# Patient Record
Sex: Female | Born: 1940 | Race: White | Hispanic: No | Marital: Married | Smoking: Former smoker
Health system: Southern US, Community
[De-identification: ages and names within clinical notes are randomized; demographics above are authoritative.]

## PROBLEM LIST (undated history)

## (undated) DIAGNOSIS — E785 Hyperlipidemia, unspecified: Secondary | ICD-10-CM

## (undated) DIAGNOSIS — Z8589 Personal history of malignant neoplasm of other organs and systems: Secondary | ICD-10-CM

## (undated) DIAGNOSIS — M179 Osteoarthritis of knee, unspecified: Secondary | ICD-10-CM

## (undated) DIAGNOSIS — Z8719 Personal history of other diseases of the digestive system: Secondary | ICD-10-CM

## (undated) DIAGNOSIS — J302 Other seasonal allergic rhinitis: Secondary | ICD-10-CM

## (undated) DIAGNOSIS — Z8619 Personal history of other infectious and parasitic diseases: Secondary | ICD-10-CM

## (undated) DIAGNOSIS — I5189 Other ill-defined heart diseases: Secondary | ICD-10-CM

## (undated) DIAGNOSIS — Z9289 Personal history of other medical treatment: Secondary | ICD-10-CM

## (undated) DIAGNOSIS — D509 Iron deficiency anemia, unspecified: Secondary | ICD-10-CM

## (undated) DIAGNOSIS — K219 Gastro-esophageal reflux disease without esophagitis: Secondary | ICD-10-CM

## (undated) DIAGNOSIS — N393 Stress incontinence (female) (male): Secondary | ICD-10-CM

## (undated) DIAGNOSIS — M1710 Unilateral primary osteoarthritis, unspecified knee: Secondary | ICD-10-CM

## (undated) DIAGNOSIS — Z8679 Personal history of other diseases of the circulatory system: Secondary | ICD-10-CM

## (undated) DIAGNOSIS — L719 Rosacea, unspecified: Secondary | ICD-10-CM

## (undated) DIAGNOSIS — I38 Endocarditis, valve unspecified: Secondary | ICD-10-CM

## (undated) DIAGNOSIS — Z85828 Personal history of other malignant neoplasm of skin: Secondary | ICD-10-CM

## (undated) DIAGNOSIS — G43909 Migraine, unspecified, not intractable, without status migrainosus: Secondary | ICD-10-CM

## (undated) DIAGNOSIS — R51 Headache: Secondary | ICD-10-CM

## (undated) DIAGNOSIS — C801 Malignant (primary) neoplasm, unspecified: Secondary | ICD-10-CM

## (undated) DIAGNOSIS — R002 Palpitations: Secondary | ICD-10-CM

## (undated) DIAGNOSIS — K5790 Diverticulosis of intestine, part unspecified, without perforation or abscess without bleeding: Secondary | ICD-10-CM

## (undated) DIAGNOSIS — N819 Female genital prolapse, unspecified: Secondary | ICD-10-CM

## (undated) DIAGNOSIS — R0989 Other specified symptoms and signs involving the circulatory and respiratory systems: Secondary | ICD-10-CM

## (undated) DIAGNOSIS — I480 Paroxysmal atrial fibrillation: Secondary | ICD-10-CM

## (undated) HISTORY — DX: Headache: R51

## (undated) HISTORY — DX: Hyperlipidemia, unspecified: E78.5

## (undated) HISTORY — DX: Female genital prolapse, unspecified: N81.9

## (undated) HISTORY — DX: Stress incontinence (female) (male): N39.3

## (undated) HISTORY — PX: MOHS SURGERY: SUR867

## (undated) HISTORY — DX: Gastro-esophageal reflux disease without esophagitis: K21.9

## (undated) HISTORY — DX: Personal history of other diseases of the circulatory system: Z86.79

## (undated) HISTORY — DX: Other specified symptoms and signs involving the circulatory and respiratory systems: R09.89

## (undated) HISTORY — DX: Unilateral primary osteoarthritis, unspecified knee: M17.10

## (undated) HISTORY — DX: Other seasonal allergic rhinitis: J30.2

## (undated) HISTORY — DX: Osteoarthritis of knee, unspecified: M17.9

## (undated) HISTORY — DX: Diverticulosis of intestine, part unspecified, without perforation or abscess without bleeding: K57.90

## (undated) HISTORY — DX: Endocarditis, valve unspecified: I38

## (undated) HISTORY — DX: Personal history of other medical treatment: Z92.89

## (undated) HISTORY — DX: Migraine, unspecified, not intractable, without status migrainosus: G43.909

## (undated) HISTORY — DX: Personal history of other infectious and parasitic diseases: Z86.19

## (undated) HISTORY — PX: CATARACT EXTRACTION: SUR2

## (undated) HISTORY — DX: Paroxysmal atrial fibrillation: I48.0

## (undated) HISTORY — DX: Rosacea, unspecified: L71.9

## (undated) HISTORY — DX: Palpitations: R00.2

## (undated) HISTORY — DX: Other ill-defined heart diseases: I51.89

## (undated) HISTORY — DX: Personal history of other diseases of the digestive system: Z87.19

---
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
## 1969-12-15 HISTORY — PX: LIPOMA EXCISION: SHX5283

## 1979-12-16 HISTORY — PX: ABDOMINAL HYSTERECTOMY: SHX81

## 1998-06-11 ENCOUNTER — Ambulatory Visit (HOSPITAL_COMMUNITY): Admission: RE | Admit: 1998-06-11 | Discharge: 1998-06-11 | Payer: Self-pay | Admitting: Obstetrics & Gynecology

## 1999-12-16 HISTORY — PX: SALPINGOOPHORECTOMY: SHX82

## 1999-12-16 HISTORY — PX: INCONTINENCE SURGERY: SHX676

## 2007-12-16 DIAGNOSIS — Z9289 Personal history of other medical treatment: Secondary | ICD-10-CM

## 2007-12-16 DIAGNOSIS — D509 Iron deficiency anemia, unspecified: Secondary | ICD-10-CM

## 2007-12-16 HISTORY — PX: COLONOSCOPY: SHX174

## 2007-12-16 HISTORY — PX: ESOPHAGOGASTRODUODENOSCOPY: SHX1529

## 2007-12-16 HISTORY — DX: Iron deficiency anemia, unspecified: D50.9

## 2007-12-16 HISTORY — DX: Personal history of other medical treatment: Z92.89

## 2007-12-16 HISTORY — PX: OTHER SURGICAL HISTORY: SHX169

## 2008-05-17 LAB — HM COLONOSCOPY

## 2008-12-15 DIAGNOSIS — Z85828 Personal history of other malignant neoplasm of skin: Secondary | ICD-10-CM

## 2008-12-15 HISTORY — DX: Personal history of other malignant neoplasm of skin: Z85.828

## 2010-01-10 ENCOUNTER — Encounter: Payer: Self-pay | Admitting: Cardiology

## 2010-09-04 ENCOUNTER — Ambulatory Visit: Payer: Self-pay | Admitting: Anesthesiology

## 2010-09-19 ENCOUNTER — Ambulatory Visit: Payer: Self-pay | Admitting: Anesthesiology

## 2011-12-30 DIAGNOSIS — N816 Rectocele: Secondary | ICD-10-CM | POA: Diagnosis not present

## 2011-12-30 DIAGNOSIS — R3915 Urgency of urination: Secondary | ICD-10-CM | POA: Diagnosis not present

## 2012-02-04 DIAGNOSIS — K219 Gastro-esophageal reflux disease without esophagitis: Secondary | ICD-10-CM | POA: Diagnosis not present

## 2012-02-04 DIAGNOSIS — R002 Palpitations: Secondary | ICD-10-CM | POA: Diagnosis not present

## 2012-02-04 DIAGNOSIS — L719 Rosacea, unspecified: Secondary | ICD-10-CM | POA: Diagnosis not present

## 2012-02-04 DIAGNOSIS — G43009 Migraine without aura, not intractable, without status migrainosus: Secondary | ICD-10-CM | POA: Diagnosis not present

## 2012-02-16 DIAGNOSIS — Z1231 Encounter for screening mammogram for malignant neoplasm of breast: Secondary | ICD-10-CM | POA: Diagnosis not present

## 2012-03-03 DIAGNOSIS — Z85828 Personal history of other malignant neoplasm of skin: Secondary | ICD-10-CM | POA: Diagnosis not present

## 2012-03-03 DIAGNOSIS — D485 Neoplasm of uncertain behavior of skin: Secondary | ICD-10-CM | POA: Diagnosis not present

## 2012-03-03 DIAGNOSIS — L851 Acquired keratosis [keratoderma] palmaris et plantaris: Secondary | ICD-10-CM | POA: Diagnosis not present

## 2012-03-03 DIAGNOSIS — L820 Inflamed seborrheic keratosis: Secondary | ICD-10-CM | POA: Diagnosis not present

## 2012-06-21 DIAGNOSIS — H26499 Other secondary cataract, unspecified eye: Secondary | ICD-10-CM | POA: Diagnosis not present

## 2012-07-12 DIAGNOSIS — Z961 Presence of intraocular lens: Secondary | ICD-10-CM | POA: Diagnosis not present

## 2012-08-31 DIAGNOSIS — N905 Atrophy of vulva: Secondary | ICD-10-CM | POA: Diagnosis not present

## 2012-08-31 DIAGNOSIS — N3941 Urge incontinence: Secondary | ICD-10-CM | POA: Diagnosis not present

## 2012-08-31 DIAGNOSIS — Z124 Encounter for screening for malignant neoplasm of cervix: Secondary | ICD-10-CM | POA: Diagnosis not present

## 2012-08-31 DIAGNOSIS — N393 Stress incontinence (female) (male): Secondary | ICD-10-CM | POA: Diagnosis not present

## 2012-10-05 DIAGNOSIS — E782 Mixed hyperlipidemia: Secondary | ICD-10-CM | POA: Diagnosis not present

## 2012-10-05 DIAGNOSIS — G43009 Migraine without aura, not intractable, without status migrainosus: Secondary | ICD-10-CM | POA: Diagnosis not present

## 2012-10-05 DIAGNOSIS — R002 Palpitations: Secondary | ICD-10-CM | POA: Diagnosis not present

## 2012-10-05 DIAGNOSIS — K219 Gastro-esophageal reflux disease without esophagitis: Secondary | ICD-10-CM | POA: Diagnosis not present

## 2012-10-26 DIAGNOSIS — H26499 Other secondary cataract, unspecified eye: Secondary | ICD-10-CM | POA: Diagnosis not present

## 2012-10-29 DIAGNOSIS — G43109 Migraine with aura, not intractable, without status migrainosus: Secondary | ICD-10-CM | POA: Diagnosis not present

## 2012-11-04 DIAGNOSIS — L820 Inflamed seborrheic keratosis: Secondary | ICD-10-CM | POA: Diagnosis not present

## 2012-11-04 DIAGNOSIS — Z85828 Personal history of other malignant neoplasm of skin: Secondary | ICD-10-CM | POA: Diagnosis not present

## 2012-11-04 DIAGNOSIS — L608 Other nail disorders: Secondary | ICD-10-CM | POA: Diagnosis not present

## 2012-11-04 DIAGNOSIS — B351 Tinea unguium: Secondary | ICD-10-CM | POA: Diagnosis not present

## 2012-11-04 DIAGNOSIS — L719 Rosacea, unspecified: Secondary | ICD-10-CM | POA: Diagnosis not present

## 2012-11-04 DIAGNOSIS — L570 Actinic keratosis: Secondary | ICD-10-CM | POA: Diagnosis not present

## 2013-04-05 DIAGNOSIS — E782 Mixed hyperlipidemia: Secondary | ICD-10-CM | POA: Diagnosis not present

## 2013-04-05 DIAGNOSIS — K219 Gastro-esophageal reflux disease without esophagitis: Secondary | ICD-10-CM | POA: Diagnosis not present

## 2013-04-05 DIAGNOSIS — R1310 Dysphagia, unspecified: Secondary | ICD-10-CM | POA: Diagnosis not present

## 2013-05-02 DIAGNOSIS — Z1231 Encounter for screening mammogram for malignant neoplasm of breast: Secondary | ICD-10-CM | POA: Diagnosis not present

## 2013-05-03 DIAGNOSIS — R12 Heartburn: Secondary | ICD-10-CM | POA: Diagnosis not present

## 2013-05-04 DIAGNOSIS — L820 Inflamed seborrheic keratosis: Secondary | ICD-10-CM | POA: Diagnosis not present

## 2013-05-04 DIAGNOSIS — L570 Actinic keratosis: Secondary | ICD-10-CM | POA: Diagnosis not present

## 2013-05-04 DIAGNOSIS — L719 Rosacea, unspecified: Secondary | ICD-10-CM | POA: Diagnosis not present

## 2013-05-04 DIAGNOSIS — D485 Neoplasm of uncertain behavior of skin: Secondary | ICD-10-CM | POA: Diagnosis not present

## 2013-05-04 DIAGNOSIS — Z85828 Personal history of other malignant neoplasm of skin: Secondary | ICD-10-CM | POA: Diagnosis not present

## 2013-05-04 DIAGNOSIS — R21 Rash and other nonspecific skin eruption: Secondary | ICD-10-CM | POA: Diagnosis not present

## 2013-05-19 ENCOUNTER — Ambulatory Visit: Payer: Self-pay | Admitting: Gastroenterology

## 2013-05-19 DIAGNOSIS — K219 Gastro-esophageal reflux disease without esophagitis: Secondary | ICD-10-CM | POA: Diagnosis not present

## 2013-05-19 DIAGNOSIS — E785 Hyperlipidemia, unspecified: Secondary | ICD-10-CM | POA: Diagnosis not present

## 2013-05-19 DIAGNOSIS — Z86010 Personal history of colonic polyps: Secondary | ICD-10-CM | POA: Diagnosis not present

## 2013-05-19 DIAGNOSIS — R12 Heartburn: Secondary | ICD-10-CM | POA: Diagnosis not present

## 2013-05-19 DIAGNOSIS — K2940 Chronic atrophic gastritis without bleeding: Secondary | ICD-10-CM | POA: Diagnosis not present

## 2013-05-19 DIAGNOSIS — R1319 Other dysphagia: Secondary | ICD-10-CM | POA: Diagnosis not present

## 2013-05-19 DIAGNOSIS — Z8049 Family history of malignant neoplasm of other genital organs: Secondary | ICD-10-CM | POA: Diagnosis not present

## 2013-05-19 DIAGNOSIS — R1310 Dysphagia, unspecified: Secondary | ICD-10-CM | POA: Diagnosis not present

## 2013-05-19 DIAGNOSIS — K2970 Gastritis, unspecified, without bleeding: Secondary | ICD-10-CM | POA: Diagnosis not present

## 2013-05-19 DIAGNOSIS — M1710 Unilateral primary osteoarthritis, unspecified knee: Secondary | ICD-10-CM | POA: Diagnosis not present

## 2013-05-19 DIAGNOSIS — Z8249 Family history of ischemic heart disease and other diseases of the circulatory system: Secondary | ICD-10-CM | POA: Diagnosis not present

## 2013-05-19 DIAGNOSIS — Z85828 Personal history of other malignant neoplasm of skin: Secondary | ICD-10-CM | POA: Diagnosis not present

## 2013-05-19 DIAGNOSIS — K319 Disease of stomach and duodenum, unspecified: Secondary | ICD-10-CM | POA: Diagnosis not present

## 2013-05-19 DIAGNOSIS — K5730 Diverticulosis of large intestine without perforation or abscess without bleeding: Secondary | ICD-10-CM | POA: Diagnosis not present

## 2013-05-19 DIAGNOSIS — Z818 Family history of other mental and behavioral disorders: Secondary | ICD-10-CM | POA: Diagnosis not present

## 2013-05-19 DIAGNOSIS — K2900 Acute gastritis without bleeding: Secondary | ICD-10-CM | POA: Diagnosis not present

## 2013-06-02 DIAGNOSIS — R12 Heartburn: Secondary | ICD-10-CM | POA: Diagnosis not present

## 2013-06-03 DIAGNOSIS — G43119 Migraine with aura, intractable, without status migrainosus: Secondary | ICD-10-CM | POA: Diagnosis not present

## 2013-06-28 DIAGNOSIS — H04129 Dry eye syndrome of unspecified lacrimal gland: Secondary | ICD-10-CM | POA: Diagnosis not present

## 2013-09-02 DIAGNOSIS — N816 Rectocele: Secondary | ICD-10-CM | POA: Diagnosis not present

## 2013-09-02 DIAGNOSIS — N905 Atrophy of vulva: Secondary | ICD-10-CM | POA: Diagnosis not present

## 2013-09-02 DIAGNOSIS — Z01419 Encounter for gynecological examination (general) (routine) without abnormal findings: Secondary | ICD-10-CM | POA: Diagnosis not present

## 2013-10-19 DIAGNOSIS — E782 Mixed hyperlipidemia: Secondary | ICD-10-CM | POA: Diagnosis not present

## 2013-10-26 DIAGNOSIS — B351 Tinea unguium: Secondary | ICD-10-CM | POA: Diagnosis not present

## 2013-10-26 DIAGNOSIS — L608 Other nail disorders: Secondary | ICD-10-CM | POA: Diagnosis not present

## 2013-10-26 DIAGNOSIS — L820 Inflamed seborrheic keratosis: Secondary | ICD-10-CM | POA: Diagnosis not present

## 2013-10-26 DIAGNOSIS — Z85828 Personal history of other malignant neoplasm of skin: Secondary | ICD-10-CM | POA: Diagnosis not present

## 2013-10-26 DIAGNOSIS — L570 Actinic keratosis: Secondary | ICD-10-CM | POA: Diagnosis not present

## 2013-10-26 DIAGNOSIS — R21 Rash and other nonspecific skin eruption: Secondary | ICD-10-CM | POA: Diagnosis not present

## 2014-01-26 DIAGNOSIS — D485 Neoplasm of uncertain behavior of skin: Secondary | ICD-10-CM | POA: Diagnosis not present

## 2014-01-26 DIAGNOSIS — L570 Actinic keratosis: Secondary | ICD-10-CM | POA: Diagnosis not present

## 2014-01-26 DIAGNOSIS — L821 Other seborrheic keratosis: Secondary | ICD-10-CM | POA: Diagnosis not present

## 2014-01-26 DIAGNOSIS — L988 Other specified disorders of the skin and subcutaneous tissue: Secondary | ICD-10-CM | POA: Diagnosis not present

## 2014-01-26 DIAGNOSIS — R21 Rash and other nonspecific skin eruption: Secondary | ICD-10-CM | POA: Diagnosis not present

## 2014-02-27 DIAGNOSIS — E782 Mixed hyperlipidemia: Secondary | ICD-10-CM | POA: Diagnosis not present

## 2014-03-07 DIAGNOSIS — H9209 Otalgia, unspecified ear: Secondary | ICD-10-CM | POA: Diagnosis not present

## 2014-03-07 DIAGNOSIS — J310 Chronic rhinitis: Secondary | ICD-10-CM | POA: Diagnosis not present

## 2014-05-16 DIAGNOSIS — R259 Unspecified abnormal involuntary movements: Secondary | ICD-10-CM | POA: Diagnosis not present

## 2014-05-16 DIAGNOSIS — E782 Mixed hyperlipidemia: Secondary | ICD-10-CM | POA: Diagnosis not present

## 2014-05-31 DIAGNOSIS — G43109 Migraine with aura, not intractable, without status migrainosus: Secondary | ICD-10-CM | POA: Diagnosis not present

## 2014-07-31 DIAGNOSIS — H5200 Hypermetropia, unspecified eye: Secondary | ICD-10-CM | POA: Diagnosis not present

## 2014-07-31 DIAGNOSIS — H04129 Dry eye syndrome of unspecified lacrimal gland: Secondary | ICD-10-CM | POA: Diagnosis not present

## 2014-10-02 DIAGNOSIS — Z23 Encounter for immunization: Secondary | ICD-10-CM | POA: Diagnosis not present

## 2014-10-02 DIAGNOSIS — Z1389 Encounter for screening for other disorder: Secondary | ICD-10-CM | POA: Diagnosis not present

## 2014-10-02 DIAGNOSIS — R296 Repeated falls: Secondary | ICD-10-CM | POA: Diagnosis not present

## 2014-10-02 DIAGNOSIS — Z0000 Encounter for general adult medical examination without abnormal findings: Secondary | ICD-10-CM | POA: Diagnosis not present

## 2014-10-16 DIAGNOSIS — N816 Rectocele: Secondary | ICD-10-CM | POA: Diagnosis not present

## 2014-10-16 DIAGNOSIS — Z01419 Encounter for gynecological examination (general) (routine) without abnormal findings: Secondary | ICD-10-CM | POA: Diagnosis not present

## 2014-10-16 DIAGNOSIS — N393 Stress incontinence (female) (male): Secondary | ICD-10-CM | POA: Diagnosis not present

## 2014-10-16 DIAGNOSIS — R300 Dysuria: Secondary | ICD-10-CM | POA: Diagnosis not present

## 2014-10-17 DIAGNOSIS — Z1231 Encounter for screening mammogram for malignant neoplasm of breast: Secondary | ICD-10-CM | POA: Diagnosis not present

## 2014-10-17 DIAGNOSIS — N63 Unspecified lump in breast: Secondary | ICD-10-CM | POA: Diagnosis not present

## 2014-11-15 DIAGNOSIS — R5383 Other fatigue: Secondary | ICD-10-CM | POA: Diagnosis not present

## 2014-11-15 DIAGNOSIS — E784 Other hyperlipidemia: Secondary | ICD-10-CM | POA: Diagnosis not present

## 2015-02-28 DIAGNOSIS — R7989 Other specified abnormal findings of blood chemistry: Secondary | ICD-10-CM | POA: Diagnosis not present

## 2015-03-19 DIAGNOSIS — Z9189 Other specified personal risk factors, not elsewhere classified: Secondary | ICD-10-CM | POA: Insufficient documentation

## 2015-03-19 DIAGNOSIS — Z8719 Personal history of other diseases of the digestive system: Secondary | ICD-10-CM | POA: Insufficient documentation

## 2015-03-19 DIAGNOSIS — M179 Osteoarthritis of knee, unspecified: Secondary | ICD-10-CM | POA: Insufficient documentation

## 2015-03-19 DIAGNOSIS — Z86010 Personal history of colonic polyps: Secondary | ICD-10-CM | POA: Insufficient documentation

## 2015-03-19 DIAGNOSIS — Z23 Encounter for immunization: Secondary | ICD-10-CM | POA: Insufficient documentation

## 2015-03-19 DIAGNOSIS — J309 Allergic rhinitis, unspecified: Secondary | ICD-10-CM | POA: Insufficient documentation

## 2015-03-19 DIAGNOSIS — H9209 Otalgia, unspecified ear: Secondary | ICD-10-CM | POA: Insufficient documentation

## 2015-03-19 DIAGNOSIS — I340 Nonrheumatic mitral (valve) insufficiency: Secondary | ICD-10-CM | POA: Insufficient documentation

## 2015-03-19 DIAGNOSIS — Z0000 Encounter for general adult medical examination without abnormal findings: Secondary | ICD-10-CM | POA: Insufficient documentation

## 2015-03-19 DIAGNOSIS — Z1239 Encounter for other screening for malignant neoplasm of breast: Secondary | ICD-10-CM | POA: Insufficient documentation

## 2015-03-19 DIAGNOSIS — L719 Rosacea, unspecified: Secondary | ICD-10-CM | POA: Insufficient documentation

## 2015-03-19 DIAGNOSIS — N816 Rectocele: Secondary | ICD-10-CM | POA: Insufficient documentation

## 2015-03-19 DIAGNOSIS — M1710 Unilateral primary osteoarthritis, unspecified knee: Secondary | ICD-10-CM | POA: Insufficient documentation

## 2015-03-19 DIAGNOSIS — R002 Palpitations: Secondary | ICD-10-CM | POA: Insufficient documentation

## 2015-03-19 DIAGNOSIS — E785 Hyperlipidemia, unspecified: Secondary | ICD-10-CM | POA: Insufficient documentation

## 2015-03-19 DIAGNOSIS — Z1331 Encounter for screening for depression: Secondary | ICD-10-CM | POA: Insufficient documentation

## 2015-03-19 DIAGNOSIS — R0989 Other specified symptoms and signs involving the circulatory and respiratory systems: Secondary | ICD-10-CM | POA: Insufficient documentation

## 2015-03-19 DIAGNOSIS — N393 Stress incontinence (female) (male): Secondary | ICD-10-CM | POA: Insufficient documentation

## 2015-03-19 DIAGNOSIS — IMO0001 Reserved for inherently not codable concepts without codable children: Secondary | ICD-10-CM | POA: Insufficient documentation

## 2015-03-19 DIAGNOSIS — Z8679 Personal history of other diseases of the circulatory system: Secondary | ICD-10-CM | POA: Insufficient documentation

## 2015-03-19 DIAGNOSIS — R5383 Other fatigue: Secondary | ICD-10-CM | POA: Insufficient documentation

## 2015-03-19 DIAGNOSIS — R7989 Other specified abnormal findings of blood chemistry: Secondary | ICD-10-CM | POA: Insufficient documentation

## 2015-03-19 DIAGNOSIS — R253 Fasciculation: Secondary | ICD-10-CM | POA: Insufficient documentation

## 2015-03-19 DIAGNOSIS — Z9181 History of falling: Secondary | ICD-10-CM | POA: Insufficient documentation

## 2015-03-19 DIAGNOSIS — J310 Chronic rhinitis: Secondary | ICD-10-CM | POA: Insufficient documentation

## 2015-03-19 DIAGNOSIS — K219 Gastro-esophageal reflux disease without esophagitis: Secondary | ICD-10-CM | POA: Insufficient documentation

## 2015-05-18 ENCOUNTER — Ambulatory Visit (INDEPENDENT_AMBULATORY_CARE_PROVIDER_SITE_OTHER): Payer: Medicare Other | Admitting: Family Medicine

## 2015-05-18 ENCOUNTER — Encounter (INDEPENDENT_AMBULATORY_CARE_PROVIDER_SITE_OTHER): Payer: Self-pay

## 2015-05-18 ENCOUNTER — Encounter: Payer: Self-pay | Admitting: Family Medicine

## 2015-05-18 VITALS — BP 100/78 | HR 74 | Resp 15 | Ht 63.75 in | Wt 160.0 lb

## 2015-05-18 DIAGNOSIS — G43909 Migraine, unspecified, not intractable, without status migrainosus: Secondary | ICD-10-CM | POA: Insufficient documentation

## 2015-05-18 DIAGNOSIS — IMO0002 Reserved for concepts with insufficient information to code with codable children: Secondary | ICD-10-CM

## 2015-05-18 DIAGNOSIS — G43709 Chronic migraine without aura, not intractable, without status migrainosus: Secondary | ICD-10-CM | POA: Diagnosis not present

## 2015-05-18 DIAGNOSIS — E785 Hyperlipidemia, unspecified: Secondary | ICD-10-CM | POA: Diagnosis not present

## 2015-05-18 MED ORDER — ATORVASTATIN CALCIUM 80 MG PO TABS: 80.0000 mg | ORAL_TABLET | Freq: Every day | ORAL | Status: DC

## 2015-05-18 NOTE — Progress Notes (Signed)
Name: Alexandria Moreno   MRN: [id number]    DOB: 10/13/1941   Date:05/18/2015       Progress Note  Subjective  Chief Complaint  Chief Complaint  Patient presents with  . Hyperlipidemia  . Migraine    Hyperlipidemia This is a chronic problem. The problem is controlled. Recent lipid tests were reviewed and are normal. She has no history of chronic renal disease, diabetes or hypothyroidism. Associated symptoms include myalgias. Pertinent negatives include no chest pain, focal weakness or leg pain. (Blateral lower leg pain especially upon waking up, usually relieved by stretching and flexing the legs.) Current antihyperlipidemic treatment includes statins. Risk factors for coronary artery disease include dyslipidemia, family history and post-menopausal.  Migraine  This is a chronic (Pt. is followed by Neurology) problem. The problem occurs monthly (average 1-2/ month). The quality of the pain is described as aching and throbbing. Associated symptoms include insomnia, a loss of balance, neck pain, phonophobia, photophobia and a visual change. Pertinent negatives include no nausea, tinnitus, vomiting or weakness. The symptoms are aggravated by activity and bright light. She has tried darkened room, cold packs, triptans and oral narcotics for the symptoms.      Past Medical History  Diagnosis Date  . Allergy   . Hyperlipidemia   . Palpitation     History  Substance Use Topics  . Smoking status: Former Smoker    Quit date: 12/15/1978  . Smokeless tobacco: Never Used  . Alcohol Use: No     Current outpatient prescriptions:  .  atorvastatin (LIPITOR) 80 MG tablet, Take 1 tablet (80 mg total) by mouth daily., Disp: 90 tablet, Rfl: 0 .  B COMPLEX VITAMINS PO, Take by mouth daily., Disp: , Rfl:  .  butalbital-acetaminophen-caffeine (FIORICET, ESGIC) 50-325-40 MG per tablet, Take by mouth as needed., Disp: , Rfl:  .  Calcium 200 MG TABS, Take 200 mg by mouth daily., Disp: , Rfl:  .   cycloSPORINE (RESTASIS) 0.05 % ophthalmic emulsion, Apply 0.05 drops to eye 2 (two) times daily., Disp: , Rfl:  .  metoprolol succinate (TOPROL-XL) 50 MG 24 hr tablet, Take 50 mg by mouth daily. Take with or immediately following a meal., Disp: , Rfl:  .  MULTIPLE VITAMIN PO, Take by mouth daily., Disp: , Rfl:  .  OMEGA-3 FATTY ACIDS PO, Take 1,000 mg by mouth daily., Disp: , Rfl:  .  pantoprazole (PROTONIX) 40 MG tablet, Take 40 mg by mouth daily., Disp: , Rfl:  .  rizatriptan (MAXALT) 10 MG tablet, Take 10 mg by mouth daily as needed., Disp: , Rfl:  .  Azelaic Acid 15 % cream, FINACEA, 15% (External Gel) - Historical Medication  (15 %) Active, Disp: , Rfl:  .  estradiol (ESTRACE) 0.1 MG/GM vaginal cream, Place 0.1 mg vaginally. 3 times a week, Disp: , Rfl:   Allergies  Allergen Reactions  . Aspirin   . Codeine Nausea And Vomiting  . Imitrex  [Sumatriptan]     Palpatations  . Vicodin  [Hydrocodone-Acetaminophen] Nausea And Vomiting    Review of Systems  HENT: Negative for tinnitus.   Eyes: Positive for photophobia.  Cardiovascular: Negative for chest pain.  Gastrointestinal: Negative for nausea and vomiting.  Musculoskeletal: Positive for myalgias and neck pain.  Neurological: Positive for loss of balance. Negative for focal weakness and weakness.  Psychiatric/Behavioral: The patient has insomnia.        Poor sleep in general      Objective  Filed Vitals:   05/18/15  0943  BP: 100/78  Pulse: 74  Resp: 15  Height: 5' 3.75" (1.619 m)  Weight: 160 lb (72.576 kg)     Physical Exam  Constitutional: She is oriented to person, place, and time and well-developed, well-nourished, and in no distress.  Cardiovascular: Normal rate.   Pulmonary/Chest: Effort normal.  Neurological: She is alert and oriented to person, place, and time.  Psychiatric: Mood and memory normal.      No results found for this or any previous visit (from the past 2160 hour(s)).   Assessment &  Plan  1. Dyslipidemia  - Lipid Profile - COMPLETE METABOLIC PANEL WITH GFR - atorvastatin (LIPITOR) 80 MG tablet; Take 1 tablet (80 mg total) by mouth daily.  Dispense: 90 tablet; Refill: 0  2. Chronic migraine  Followed by neurology. She is on Maxalt as needed.

## 2015-05-19 LAB — CMP14+EGFR
A/G RATIO: 1.8 (ref 1.1–2.5)
ALT: 25 IU/L (ref 0–32)
AST: 25 IU/L (ref 0–40)
Albumin: 4.3 g/dL (ref 3.5–4.8)
Alkaline Phosphatase: 109 IU/L (ref 39–117)
BILIRUBIN TOTAL: 0.5 mg/dL (ref 0.0–1.2)
BUN / CREAT RATIO: 16 (ref 11–26)
BUN: 12 mg/dL (ref 8–27)
CO2: 20 mmol/L (ref 18–29)
Calcium: 9.3 mg/dL (ref 8.7–10.3)
Chloride: 105 mmol/L (ref 97–108)
Creatinine, Ser: 0.76 mg/dL (ref 0.57–1.00)
GFR calc Af Amer: 89 mL/min/{1.73_m2} (ref >59)
GFR calc non Af Amer: 78 mL/min/{1.73_m2} (ref >59)
GLUCOSE: 91 mg/dL (ref 65–99)
Globulin, Total: 2.4 g/dL (ref 1.5–4.5)
POTASSIUM: 4.4 mmol/L (ref 3.5–5.2)
SODIUM: 144 mmol/L (ref 134–144)
TOTAL PROTEIN: 6.7 g/dL (ref 6.0–8.5)

## 2015-05-19 LAB — LIPID PANEL
CHOL/HDL RATIO: 3.6 ratio (ref 0.0–4.4)
Cholesterol, Total: 157 mg/dL (ref 100–199)
HDL: 44 mg/dL (ref >39)
LDL Calculated: 72 mg/dL (ref 0–99)
Triglycerides: 206 mg/dL — ABNORMAL HIGH (ref 0–149)
VLDL Cholesterol Cal: 41 mg/dL — ABNORMAL HIGH (ref 5–40)

## 2015-05-22 DIAGNOSIS — D1800 Hemangioma unspecified site: Secondary | ICD-10-CM | POA: Diagnosis not present

## 2015-05-22 DIAGNOSIS — D229 Melanocytic nevi, unspecified: Secondary | ICD-10-CM | POA: Diagnosis not present

## 2015-05-22 DIAGNOSIS — C4491 Basal cell carcinoma of skin, unspecified: Secondary | ICD-10-CM

## 2015-05-22 DIAGNOSIS — L821 Other seborrheic keratosis: Secondary | ICD-10-CM | POA: Diagnosis not present

## 2015-05-22 DIAGNOSIS — D485 Neoplasm of uncertain behavior of skin: Secondary | ICD-10-CM | POA: Diagnosis not present

## 2015-05-22 DIAGNOSIS — Z1283 Encounter for screening for malignant neoplasm of skin: Secondary | ICD-10-CM | POA: Diagnosis not present

## 2015-05-22 DIAGNOSIS — C44311 Basal cell carcinoma of skin of nose: Secondary | ICD-10-CM | POA: Diagnosis not present

## 2015-05-22 DIAGNOSIS — Z85828 Personal history of other malignant neoplasm of skin: Secondary | ICD-10-CM | POA: Diagnosis not present

## 2015-05-22 DIAGNOSIS — L820 Inflamed seborrheic keratosis: Secondary | ICD-10-CM | POA: Diagnosis not present

## 2015-05-22 DIAGNOSIS — L570 Actinic keratosis: Secondary | ICD-10-CM | POA: Diagnosis not present

## 2015-05-22 HISTORY — DX: Basal cell carcinoma of skin, unspecified: C44.91

## 2015-06-11 ENCOUNTER — Telehealth: Payer: Self-pay | Admitting: Family Medicine

## 2015-06-11 NOTE — Telephone Encounter (Signed)
Pt needs another rx for the Atorvastatin sent in to [facility]. Pt's pharmacy said that the rx needed to be sent in again because the phone number and fax number on file are incorrect.

## 2015-06-12 ENCOUNTER — Other Ambulatory Visit: Payer: Self-pay | Admitting: Family Medicine

## 2015-06-12 DIAGNOSIS — E785 Hyperlipidemia, unspecified: Secondary | ICD-10-CM

## 2015-06-13 MED ORDER — ATORVASTATIN CALCIUM 80 MG PO TABS: 80.0000 mg | ORAL_TABLET | Freq: Every day | ORAL | Status: DC

## 2015-06-13 NOTE — Telephone Encounter (Signed)
Escript sent to pharmacy to make the current phone and fax numbers active.

## 2015-07-09 ENCOUNTER — Telehealth: Payer: Self-pay | Admitting: Family Medicine

## 2015-07-09 NOTE — Telephone Encounter (Signed)
Patient has been trying to get her refill on Atorvastatin  However the [facility] ([facility] does not have any information on [place]. They needing verification for the medication. It is no refills on the medication. Please give them a call [phone]

## 2015-07-09 NOTE — Telephone Encounter (Signed)
Called [facility] and called in Medication.

## 2015-07-12 DIAGNOSIS — G43109 Migraine with aura, not intractable, without status migrainosus: Secondary | ICD-10-CM | POA: Diagnosis not present

## 2015-07-24 DIAGNOSIS — H52223 Regular astigmatism, bilateral: Secondary | ICD-10-CM | POA: Diagnosis not present

## 2015-07-24 DIAGNOSIS — H1852 Epithelial (juvenile) corneal dystrophy: Secondary | ICD-10-CM | POA: Diagnosis not present

## 2015-07-24 DIAGNOSIS — H5203 Hypermetropia, bilateral: Secondary | ICD-10-CM | POA: Diagnosis not present

## 2015-07-24 DIAGNOSIS — Z961 Presence of intraocular lens: Secondary | ICD-10-CM | POA: Diagnosis not present

## 2015-07-24 DIAGNOSIS — H524 Presbyopia: Secondary | ICD-10-CM | POA: Diagnosis not present

## 2015-07-24 DIAGNOSIS — H04123 Dry eye syndrome of bilateral lacrimal glands: Secondary | ICD-10-CM | POA: Diagnosis not present

## 2015-08-07 DIAGNOSIS — C44311 Basal cell carcinoma of skin of nose: Secondary | ICD-10-CM | POA: Diagnosis not present

## 2015-08-07 DIAGNOSIS — Z85828 Personal history of other malignant neoplasm of skin: Secondary | ICD-10-CM | POA: Diagnosis not present

## 2015-08-29 ENCOUNTER — Encounter: Payer: Self-pay | Admitting: Family Medicine

## 2015-08-31 DIAGNOSIS — Z85828 Personal history of other malignant neoplasm of skin: Secondary | ICD-10-CM | POA: Diagnosis not present

## 2015-08-31 DIAGNOSIS — L718 Other rosacea: Secondary | ICD-10-CM | POA: Diagnosis not present

## 2015-09-06 ENCOUNTER — Telehealth: Payer: Self-pay | Admitting: Family Medicine

## 2015-09-06 MED ORDER — METOPROLOL SUCCINATE ER 25 MG PO TB24: 25.0000 mg | ORAL_TABLET | Freq: Every day | ORAL | Status: DC

## 2015-09-06 NOTE — Telephone Encounter (Signed)
PT SAID THAT THE PHARM HAS TRIED MULTIPLE TIMES TO GET THE REFILL ON HER BETA BLOCKER WITH NO LUCK. THEIR FAX # IS [phone] IF YOU COULD PLEASE GET THIS REFILLED FOR SHE IS OUT. IT IS [facility] AND HER RX HAS EXPIRED.

## 2015-09-06 NOTE — Telephone Encounter (Signed)
Medication has been refilled and sent to [facility]

## 2015-09-27 NOTE — Telephone Encounter (Signed)
ERRENOUS °

## 2015-10-09 ENCOUNTER — Other Ambulatory Visit: Payer: Self-pay | Admitting: Family Medicine

## 2015-10-30 DIAGNOSIS — Z01419 Encounter for gynecological examination (general) (routine) without abnormal findings: Secondary | ICD-10-CM | POA: Diagnosis not present

## 2015-10-30 DIAGNOSIS — N816 Rectocele: Secondary | ICD-10-CM | POA: Diagnosis not present

## 2015-10-30 DIAGNOSIS — K5900 Constipation, unspecified: Secondary | ICD-10-CM | POA: Diagnosis not present

## 2015-10-30 DIAGNOSIS — N905 Atrophy of vulva: Secondary | ICD-10-CM | POA: Diagnosis not present

## 2015-11-19 ENCOUNTER — Ambulatory Visit (INDEPENDENT_AMBULATORY_CARE_PROVIDER_SITE_OTHER): Payer: Medicare Other | Admitting: Family Medicine

## 2015-11-19 VITALS — BP 122/78 | HR 59 | Temp 97.9°F | Resp 14 | Ht 64.0 in | Wt 154.5 lb

## 2015-11-19 DIAGNOSIS — R0982 Postnasal drip: Secondary | ICD-10-CM

## 2015-11-19 DIAGNOSIS — R002 Palpitations: Secondary | ICD-10-CM | POA: Diagnosis not present

## 2015-11-19 DIAGNOSIS — R05 Cough: Secondary | ICD-10-CM | POA: Diagnosis not present

## 2015-11-19 DIAGNOSIS — R053 Chronic cough: Secondary | ICD-10-CM

## 2015-11-19 DIAGNOSIS — E785 Hyperlipidemia, unspecified: Secondary | ICD-10-CM | POA: Diagnosis not present

## 2015-11-19 DIAGNOSIS — I480 Paroxysmal atrial fibrillation: Secondary | ICD-10-CM | POA: Insufficient documentation

## 2015-11-19 DIAGNOSIS — J329 Chronic sinusitis, unspecified: Secondary | ICD-10-CM

## 2015-11-19 MED ORDER — FLUTICASONE PROPIONATE 50 MCG/ACT NA SUSP: 2.0000 | Freq: Every day | NASAL | Status: DC

## 2015-11-19 MED ORDER — AMOXICILLIN-POT CLAVULANATE 875-125 MG PO TABS: 1.0000 | ORAL_TABLET | Freq: Two times a day (BID) | ORAL | Status: DC

## 2015-11-19 MED ORDER — BENZONATATE 200 MG PO CAPS: 200.0000 mg | ORAL_CAPSULE | Freq: Two times a day (BID) | ORAL | Status: DC | PRN

## 2015-11-19 NOTE — Progress Notes (Signed)
Name: Alexandria Moreno   MRN: [id number]    DOB: Apr 17, 1941   Date:11/19/2015       Progress Note  Subjective  Chief Complaint  Chief Complaint  Patient presents with  . Medication Refill    follow-up 6 month  . Hypertension  . Hyperlipidemia  . Gastroesophageal Reflux  . Migraine    increasing, but see's Neuro Dr. Manuella Ghazi    Hyperlipidemia This is a chronic problem. The problem is controlled. Recent lipid tests were reviewed and are normal. Pertinent negatives include no chest pain, leg pain, myalgias or shortness of breath. Current antihyperlipidemic treatment includes statins. The current treatment provides moderate improvement of lipids. Risk factors for coronary artery disease include dyslipidemia.  Cough This is a new problem. The current episode started more than 1 month ago. The problem has been unchanged. The cough is non-productive. Associated symptoms include chills (had chills when the infection first started.) and a sore throat. Pertinent negatives include no chest pain, ear congestion, ear pain, fever, heartburn, myalgias, nasal congestion, shortness of breath or sweats. She has tried OTC cough suppressant for the symptoms.  Palpitations  This is a chronic problem. The problem has been resolved. Associated symptoms include coughing. Pertinent negatives include no anxiety, chest pain, fever, irregular heartbeat or shortness of breath. She has tried beta blockers for the symptoms. The treatment provided significant relief. Risk factors include dyslipidemia.    Past Medical History  Diagnosis Date  . Allergy   . Hyperlipidemia   . Palpitation     Past Surgical History  Procedure Laterality Date  . Abdominal hysterectomy    . Mohs surgery      [facility]  . Incontinence surgery  2009    Family History  Problem Relation Age of Onset  . Stroke Mother   . Cancer Mother     Cervical  . Depression Mother   . Dementia Mother   . Heart disease Father   . Alcohol abuse Father    . Osteoporosis Father   . Hypertension Brother     Social History   Social History  . Marital Status: Married    Spouse Name: N/A  . Number of Children: N/A  . Years of Education: N/A   Occupational History  . Not on file.   Social History Main Topics  . Smoking status: Former Smoker    Quit date: 12/15/1978  . Smokeless tobacco: Never Used  . Alcohol Use: No  . Drug Use: No  . Sexual Activity: Not on file   Other Topics Concern  . Not on file   Social History Narrative     Current outpatient prescriptions:  .  baclofen (LIORESAL) 10 MG tablet, Take 1 tablet by mouth as needed., Disp: , Rfl:  .  atorvastatin (LIPITOR) 80 MG tablet, TAKE 1 TABLET DAILY, Disp: 90 tablet, Rfl: 0 .  B COMPLEX VITAMINS PO, Take by mouth daily., Disp: , Rfl:  .  Calcium 200 MG TABS, Take 200 mg by mouth daily., Disp: , Rfl:  .  Cyanocobalamin (RA VITAMIN B-12 TR) 1000 MCG TBCR, Take 1 tablet by mouth daily., Disp: , Rfl:  .  cycloSPORINE (RESTASIS) 0.05 % ophthalmic emulsion, Apply 0.05 drops to eye 2 (two) times daily., Disp: , Rfl:  .  metoprolol succinate (TOPROL-XL) 25 MG 24 hr tablet, Take 1 tablet (25 mg total) by mouth daily., Disp: 90 tablet, Rfl: 0 .  MULTIPLE VITAMIN PO, Take by mouth daily., Disp: , Rfl:  .  OMEGA-3  FATTY ACIDS PO, Take 1,000 mg by mouth daily., Disp: , Rfl:  .  pantoprazole (PROTONIX) 40 MG tablet, Take 40 mg by mouth daily., Disp: , Rfl:  .  rizatriptan (MAXALT) 10 MG tablet, Take 10 mg by mouth daily as needed., Disp: , Rfl:  .  topiramate (TOPAMAX) 100 MG tablet, Take 1 tablet by mouth daily., Disp: , Rfl:   Allergies  Allergen Reactions  . Aspirin   . Codeine Nausea And Vomiting  . Imitrex  [Sumatriptan]     Palpatations  . Vicodin  [Hydrocodone-Acetaminophen] Nausea And Vomiting     Review of Systems  Constitutional: Positive for chills (had chills when the infection first started.). Negative for fever.  HENT: Positive for congestion and sore  throat. Negative for ear pain.   Eyes: Negative for blurred vision and double vision.  Respiratory: Positive for cough. Negative for sputum production and shortness of breath.   Cardiovascular: Positive for palpitations. Negative for chest pain and leg swelling.  Gastrointestinal: Negative for heartburn.  Musculoskeletal: Negative for myalgias.  Psychiatric/Behavioral: The patient is not nervous/anxious.     Objective  Filed Vitals:   11/19/15 1016  BP: 122/78  Pulse: 59  Temp: 97.9 F (36.6 C)  TempSrc: Oral  Resp: 14  Height: '5\' 4"'$  (1.626 m)  Weight: 154 lb 8 oz (70.081 kg)  SpO2: 97%    Physical Exam  Constitutional: She is well-developed, well-nourished, and in no distress.  HENT:  Head: Normocephalic and atraumatic.  Right Ear: Tympanic membrane and ear canal normal.  Left Ear: Tympanic membrane and ear canal normal.  Nose: Mucosal edema present. No rhinorrhea. Right sinus exhibits no maxillary sinus tenderness and no frontal sinus tenderness. Left sinus exhibits no maxillary sinus tenderness and no frontal sinus tenderness.  Mouth/Throat: Oropharynx is clear and moist and mucous membranes are normal.  Nasal mucosa is red, no rhinorrhea.  Cardiovascular: Normal rate and regular rhythm.   Pulmonary/Chest: Effort normal and breath sounds normal.  Nursing note and vitals reviewed.   Assessment & Plan  1. Dyslipidemia Stable, recheck Lipids and liver enzymes today. - Lipid Profile - Comprehensive Metabolic Panel (CMET)  2. Palpitation Normal HR. Cont. Beta-blocker tx.  3. Post-nasal drainage  - fluticasone (FLONASE) 50 MCG/ACT nasal spray; Place 2 sprays into both nostrils daily.  Dispense: 16 g; Refill: 6 - amoxicillin-clavulanate (AUGMENTIN) 875-125 MG tablet; Take 1 tablet by mouth 2 (two) times daily.  Dispense: 20 tablet; Refill: 0  4. Cough, persistent  - benzonatate (TESSALON) 200 MG capsule; Take 1 capsule (200 mg total) by mouth 2 (two) times daily as  needed for cough.  Dispense: 20 capsule; Refill: 0    Carleton [place] [facility] 11/19/2015 10:35 AM

## 2015-11-20 LAB — COMPREHENSIVE METABOLIC PANEL
A/G RATIO: 2 (ref 1.1–2.5)
ALT: 21 IU/L (ref 0–32)
AST: 24 IU/L (ref 0–40)
Albumin: 4.3 g/dL (ref 3.5–4.8)
Alkaline Phosphatase: 96 IU/L (ref 39–117)
BILIRUBIN TOTAL: 0.5 mg/dL (ref 0.0–1.2)
BUN/Creatinine Ratio: 19 (ref 11–26)
BUN: 14 mg/dL (ref 8–27)
CHLORIDE: 106 mmol/L (ref 97–106)
CO2: 20 mmol/L (ref 18–29)
Calcium: 9.1 mg/dL (ref 8.7–10.3)
Creatinine, Ser: 0.72 mg/dL (ref 0.57–1.00)
GFR calc non Af Amer: 83 mL/min/{1.73_m2} (ref >59)
GFR, EST AFRICAN AMERICAN: 95 mL/min/{1.73_m2} (ref >59)
GLOBULIN, TOTAL: 2.2 g/dL (ref 1.5–4.5)
Glucose: 83 mg/dL (ref 65–99)
POTASSIUM: 4.2 mmol/L (ref 3.5–5.2)
SODIUM: 142 mmol/L (ref 136–144)
TOTAL PROTEIN: 6.5 g/dL (ref 6.0–8.5)

## 2015-11-20 LAB — LIPID PANEL
CHOL/HDL RATIO: 3.4 ratio (ref 0.0–4.4)
CHOLESTEROL TOTAL: 144 mg/dL (ref 100–199)
HDL: 42 mg/dL (ref >39)
LDL CALC: 71 mg/dL (ref 0–99)
TRIGLYCERIDES: 155 mg/dL — AB (ref 0–149)
VLDL Cholesterol Cal: 31 mg/dL (ref 5–40)

## 2015-11-28 ENCOUNTER — Telehealth: Payer: Self-pay | Admitting: Family Medicine

## 2015-11-28 NOTE — Telephone Encounter (Signed)
Pt needs refill on her Beta Blocker to be sent into [facility].

## 2015-11-30 MED ORDER — METOPROLOL SUCCINATE ER 25 MG PO TB24: 25.0000 mg | ORAL_TABLET | Freq: Every day | ORAL | Status: DC

## 2015-11-30 NOTE — Telephone Encounter (Signed)
Medication has been refilled and sent to [facility]

## 2015-12-25 DIAGNOSIS — G43119 Migraine with aura, intractable, without status migrainosus: Secondary | ICD-10-CM | POA: Diagnosis not present

## 2015-12-26 DIAGNOSIS — C44311 Basal cell carcinoma of skin of nose: Secondary | ICD-10-CM | POA: Diagnosis not present

## 2015-12-29 ENCOUNTER — Other Ambulatory Visit: Payer: Self-pay | Admitting: Gastroenterology

## 2016-01-05 ENCOUNTER — Telehealth: Payer: Self-pay | Admitting: Family Medicine

## 2016-01-08 ENCOUNTER — Encounter: Payer: Self-pay | Admitting: Family Medicine

## 2016-01-08 ENCOUNTER — Ambulatory Visit (INDEPENDENT_AMBULATORY_CARE_PROVIDER_SITE_OTHER): Payer: Medicare Other | Admitting: Family Medicine

## 2016-01-08 ENCOUNTER — Ambulatory Visit (INDEPENDENT_AMBULATORY_CARE_PROVIDER_SITE_OTHER)
Admission: RE | Admit: 2016-01-08 | Discharge: 2016-01-08 | Disposition: A | Discharge status: Home / Self Care | Payer: Medicare Other | Source: Ambulatory Visit | Attending: Family Medicine | Admitting: Family Medicine

## 2016-01-08 VITALS — BP 128/76 | HR 68 | Temp 97.9°F | Ht 63.0 in | Wt 154.5 lb

## 2016-01-08 DIAGNOSIS — M533 Sacrococcygeal disorders, not elsewhere classified: Secondary | ICD-10-CM

## 2016-01-08 DIAGNOSIS — IMO0002 Reserved for concepts with insufficient information to code with codable children: Secondary | ICD-10-CM

## 2016-01-08 DIAGNOSIS — E785 Hyperlipidemia, unspecified: Secondary | ICD-10-CM

## 2016-01-08 DIAGNOSIS — G43709 Chronic migraine without aura, not intractable, without status migrainosus: Secondary | ICD-10-CM | POA: Diagnosis not present

## 2016-01-08 DIAGNOSIS — G8929 Other chronic pain: Secondary | ICD-10-CM | POA: Diagnosis not present

## 2016-01-08 DIAGNOSIS — K219 Gastro-esophageal reflux disease without esophagitis: Secondary | ICD-10-CM | POA: Diagnosis not present

## 2016-01-08 DIAGNOSIS — M47816 Spondylosis without myelopathy or radiculopathy, lumbar region: Secondary | ICD-10-CM | POA: Diagnosis not present

## 2016-01-08 DIAGNOSIS — R002 Palpitations: Secondary | ICD-10-CM

## 2016-01-08 NOTE — Assessment & Plan Note (Signed)
Longstanding issue previously presumed sciatica. Today's exam more consistent with sacroiliitis vs L sciatica. Check pelvic xray to eval L SI joint. Consider return to PT

## 2016-01-08 NOTE — Patient Instructions (Addendum)
Cut lipitor in half and take 1/2 tablet daily. Will see how leg cramps do and recheck cholesterol next labwork. Xray of left SI joint. We will call you with results.  Return as needed or in 4-6 months for medicare wellness visit      Mediterranean Diet  Why follow it? Research shows. . Those who follow the Mediterranean diet have a reduced risk of heart disease  . The diet is associated with a reduced incidence of Parkinson's and Alzheimer's diseases . People following the diet may have longer life expectancies and lower rates of chronic diseases  . The Dietary Guidelines for Americans recommends the Mediterranean diet as an eating plan to promote health and prevent disease  What Is the Mediterranean Diet?  . Healthy eating plan based on typical foods and recipes of Mediterranean-style cooking . The diet is primarily a plant based diet; these foods should make up a majority of meals   Starches - Plant based foods should make up a majority of meals - They are an important sources of vitamins, minerals, energy, antioxidants, and fiber - Choose whole grains, foods high in fiber and minimally processed items  - Typical grain sources include wheat, oats, barley, corn, brown rice, bulgar, farro, millet, polenta, couscous  - Various types of beans include chickpeas, lentils, fava beans, black beans, white beans   Fruits  Veggies - Large quantities of antioxidant rich fruits & veggies; 6 or more servings  - Vegetables can be eaten raw or lightly drizzled with oil and cooked  - Vegetables common to the traditional Mediterranean Diet include: artichokes, arugula, beets, broccoli, brussel sprouts, cabbage, carrots, celery, collard greens, cucumbers, eggplant, kale, leeks, lemons, lettuce, mushrooms, okra, onions, peas, peppers, potatoes, pumpkin, radishes, rutabaga, shallots, spinach, sweet potatoes, turnips, zucchini - Fruits common to the Mediterranean Diet include: apples, apricots, avocados,  cherries, clementines, dates, figs, grapefruits, grapes, melons, nectarines, oranges, peaches, pears, pomegranates, strawberries, tangerines  Fats - Replace butter and margarine with healthy oils, such as olive oil, canola oil, and tahini  - Limit nuts to no more than a handful a day  - Nuts include walnuts, almonds, pecans, pistachios, pine nuts  - Limit or avoid candied, honey roasted or heavily salted nuts - Olives are central to the [employer] - can be eaten whole or used in a variety of dishes   Meats Protein - Limiting red meat: no more than a few times a month - When eating red meat: choose lean cuts and keep the portion to the size of deck of cards - Eggs: approx. 0 to 4 times a week  - Fish and lean poultry: at least 2 a week  - Healthy protein sources include, chicken, [place], lean beef, lamb - Increase intake of seafood such as tuna, salmon, trout, mackerel, shrimp, scallops - Avoid or limit high fat processed meats such as sausage and bacon  Dairy - Include moderate amounts of low fat dairy products  - Focus on healthy dairy such as fat free yogurt, skim milk, low or reduced fat cheese - Limit dairy products higher in fat such as whole or 2% milk, cheese, ice cream  Alcohol - Moderate amounts of red wine is ok  - No more than 5 oz daily for women (all ages) and men older than age 106  - No more than 10 oz of wine daily for men younger than 17  Other - Limit sweets and other desserts  - Use herbs and spices instead of salt  to flavor foods  - Herbs and spices common to the traditional Mediterranean Diet include: basil, bay leaves, chives, cloves, cumin, fennel, garlic, lavender, marjoram, mint, oregano, parsley, pepper, rosemary, sage, savory, sumac, tarragon, thyme   It's not just a diet, it's a lifestyle:  . The Mediterranean diet includes lifestyle factors typical of those in the region  . Foods, drinks and meals are best eaten with others and savored . Daily physical  activity is important for overall good health . This could be strenuous exercise like running and aerobics . This could also be more leisurely activities such as walking, housework, yard-work, or taking the stairs . Moderation is the key; a balanced and healthy diet accommodates most foods and drinks . Consider portion sizes and frequency of consumption of certain foods   Meal Ideas & Options:  . Breakfast:  o Whole wheat toast or whole wheat English muffins with peanut butter & hard boiled egg o Steel cut oats topped with apples & cinnamon and skim milk  o Fresh fruit: banana, strawberries, melon, berries, peaches  o Smoothies: strawberries, bananas, greek yogurt, peanut butter o Low fat greek yogurt with blueberries and granola  o Egg white omelet with spinach and mushrooms o Breakfast couscous: whole wheat couscous, apricots, skim milk, cranberries  . Sandwiches:  o Hummus and grilled vegetables (peppers, zucchini, squash) on whole wheat bread   o Grilled chicken on whole wheat pita with lettuce, tomatoes, cucumbers or tzatziki  o Tuna salad on whole wheat bread: tuna salad made with greek yogurt, olives, red peppers, capers, green onions o Garlic rosemary lamb pita: lamb sauted with garlic, rosemary, salt & pepper; add lettuce, cucumber, greek yogurt to pita - flavor with lemon juice and black pepper  . Seafood:  o Mediterranean grilled salmon, seasoned with garlic, basil, parsley, lemon juice and black pepper o Shrimp, lemon, and spinach whole-grain pasta salad made with low fat greek yogurt  o Seared scallops with lemon orzo  o Seared tuna steaks seasoned salt, pepper, coriander topped with tomato mixture of olives, tomatoes, olive oil, minced garlic, parsley, green onions and cappers  . Meats:  o Herbed greek chicken salad with kalamata olives, cucumber, feta  o Red bell peppers stuffed with spinach, bulgur, lean ground beef (or lentils) & topped with feta   o Kebabs: skewers of  chicken, tomatoes, onions, zucchini, squash  o [place] burgers: made with red onions, mint, dill, lemon juice, feta cheese topped with roasted red peppers . Vegetarian o Cucumber salad: cucumbers, artichoke hearts, celery, red onion, feta cheese, tossed in olive oil & lemon juice  o Hummus and whole grain pita points with a greek salad (lettuce, tomato, feta, olives, cucumbers, red onion) o Lentil soup with celery, carrots made with vegetable broth, garlic, salt and pepper  o Tabouli salad: parsley, bulgur, mint, scallions, cucumbers, tomato, radishes, lemon juice, olive oil, salt and pepper.

## 2016-01-08 NOTE — Progress Notes (Signed)
Pre visit review using our clinic review tool, if applicable. No additional management support is needed unless otherwise documented below in the visit note. 

## 2016-01-08 NOTE — Assessment & Plan Note (Addendum)
Chronic, stable. Reviewed fmhx and latest FLP from last month. Reasonable to try lower statin dose - start atorvastatin '40mg'$  daily (1/2 tab of '80mg'$  at home) and monitor for improved leg cramps on lower dose. Recheck FLP next fasting labs. Recommended mediterranean diet. Continue fish oil 1 capsule daily.

## 2016-01-08 NOTE — Assessment & Plan Note (Signed)
Chronic, stable continue current regimen of PPI daily.

## 2016-01-08 NOTE — Addendum Note (Signed)
Addended by: Ellamae Sia on: 01/08/2016 03:35 PM   Modules accepted: Orders

## 2016-01-08 NOTE — Progress Notes (Signed)
BP 128/76 mmHg  Pulse 68  Temp(Src) 97.9 F (36.6 C) (Oral)  Ht '5\' 3"'$  (1.6 m)  Wt 154 lb 8 oz (70.081 kg)  BMI 27.38 kg/m2   CC: new pt to establish care  Subjective:    Patient ID: Alexandria Moreno, female    DOB: 1940/12/21, 75 y.o.   MRN: [id number]  HPI: Alexandria Moreno is a 75 y.o. female presenting on 01/08/2016 for Establish Care   Prior saw Dr Manuella Ghazi at [facility]  She has been following slim fast diet.   H/o L sciatica ongoing for years. Started aft fall. Did have PT initially 1980s not since. No h/o fractures. No lower back pain. No numbness/weakness of leg. Worse with prolonged sitting. No imaging in the past. No fevers.   HLD - states good control on high dose atorvastatin. Interested in decreasing dose.   Migraines - on topamax '100mg'$  BID preventatively, maxalt abortively. Gets 3 migraines/month. Sees Dr Angela Nevin for migraines.   Palpitations resolved with Toprol XL.   GERD - on PPI daily. H/o GI bleed from aspirin use.   GI - [facility] - Dr Ruby Cola - Dr Onalee Hua and Dr Brendolyn Patty GYN - Dr Teryl Lucy  Preventative: 08/2015 last well woman Colonoscopy 2009 WNL [facility] [place]) Flu ALLERGY prevnar 2015, pneumovax 2009 Td 2009 zostavax 2014  Lives with husband  Occupation: retired, was Art therapist Edu: some college Activity: stationary bicycle Diet: good water, following slim fast diet, good vegetables  Relevant past medical, surgical, family and social history reviewed and updated as indicated. Interim medical history since our last visit reviewed. Allergies and medications reviewed and updated. Current Outpatient Prescriptions on File Prior to Visit  Medication Sig  . atorvastatin (LIPITOR) 80 MG tablet TAKE 1 TABLET DAILY (Patient taking differently: TAKE 1/2 TABLET DAILY)  . B COMPLEX VITAMINS PO Take by mouth daily.  . baclofen (LIORESAL) 10 MG tablet Take 1 tablet by mouth as needed.  . cycloSPORINE (RESTASIS) 0.05 % ophthalmic emulsion  Apply 0.05 drops to eye 2 (two) times daily.  . metoprolol succinate (TOPROL-XL) 25 MG 24 hr tablet Take 1 tablet (25 mg total) by mouth daily.  . MULTIPLE VITAMIN PO Take by mouth daily.  . OMEGA-3 FATTY ACIDS PO Take 1,200 mg by mouth daily.   . pantoprazole (PROTONIX) 40 MG tablet TAKE 1 TABLET DAILY  . rizatriptan (MAXALT) 10 MG tablet Take 10 mg by mouth daily as needed.   No current facility-administered medications on file prior to visit.    Review of Systems Per HPI unless specifically indicated in ROS section     Objective:    BP 128/76 mmHg  Pulse 68  Temp(Src) 97.9 F (36.6 C) (Oral)  Ht '5\' 3"'$  (1.6 m)  Wt 154 lb 8 oz (70.081 kg)  BMI 27.38 kg/m2  Wt Readings from Last 3 Encounters:  01/08/16 154 lb 8 oz (70.081 kg)  11/19/15 154 lb 8 oz (70.081 kg)  05/18/15 160 lb (72.576 kg)    Physical Exam  Constitutional: She is oriented to person, place, and time. She appears well-developed and well-nourished. No distress.  HENT:  Head: Normocephalic and atraumatic.  Right Ear: Hearing, tympanic membrane, external ear and ear canal normal.  Left Ear: Hearing, tympanic membrane, external ear and ear canal normal.  Nose: Nose normal.  Mouth/Throat: Uvula is midline, oropharynx is clear and moist and mucous membranes are normal. No oropharyngeal exudate, posterior oropharyngeal edema or posterior oropharyngeal erythema.  Eyes:  Conjunctivae and EOM are normal. Pupils are equal, round, and reactive to light. No scleral icterus.  Neck: Normal range of motion. Neck supple. No thyromegaly present.  Cardiovascular: Normal rate, regular rhythm, normal heart sounds and intact distal pulses.   No murmur heard. Pulses:      Radial pulses are 2+ on the right side, and 2+ on the left side.  Pulmonary/Chest: Effort normal and breath sounds normal. No respiratory distress. She has no wheezes. She has no rales.  Musculoskeletal: Normal range of motion. She exhibits no edema.  No pain  midline spine No paraspinous mm tenderness Neg SLR bilaterally. No pain with int/ext rotation at hip. + FABER on left. + pain at L SIJ, sciatic notch. No pain on right. Popping at groin and ASIS with SLR on left  Lymphadenopathy:    She has no cervical adenopathy.  Neurological: She is alert and oriented to person, place, and time.  CN grossly intact, station and gait intact  Skin: Skin is warm and dry. No rash noted.  Psychiatric: She has a normal mood and affect. Her behavior is normal. Judgment and thought content normal.  Nursing note and vitals reviewed.  Results for orders placed or performed in visit on 11/19/15  Lipid Profile  Result Value Ref Range   Cholesterol, Total 144 100 - 199 mg/dL   Triglycerides 155 (H) 0 - 149 mg/dL   HDL 42 >39 mg/dL   VLDL Cholesterol Cal 31 5 - 40 mg/dL   LDL Calculated 71 0 - 99 mg/dL   Chol/HDL Ratio 3.4 0.0 - 4.4 ratio units  Comprehensive Metabolic Panel (CMET)  Result Value Ref Range   Glucose 83 65 - 99 mg/dL   BUN 14 8 - 27 mg/dL   Creatinine, Ser 0.72 0.57 - 1.00 mg/dL   GFR calc non Af Amer 83 >59 mL/min/1.73   GFR calc Af Amer 95 >59 mL/min/1.73   BUN/Creatinine Ratio 19 11 - 26   Sodium 142 136 - 144 mmol/L   Potassium 4.2 3.5 - 5.2 mmol/L   Chloride 106 97 - 106 mmol/L   CO2 20 18 - 29 mmol/L   Calcium 9.1 8.7 - 10.3 mg/dL   Total Protein 6.5 6.0 - 8.5 g/dL   Albumin 4.3 3.5 - 4.8 g/dL   Globulin, Total 2.2 1.5 - 4.5 g/dL   Albumin/Globulin Ratio 2.0 1.1 - 2.5   Bilirubin Total 0.5 0.0 - 1.2 mg/dL   Alkaline Phosphatase 96 39 - 117 IU/L   AST 24 0 - 40 IU/L   ALT 21 0 - 32 IU/L      Assessment & Plan:   Problem List Items Addressed This Visit    Palpitation    Resolved on Toprol XL.      GERD (gastroesophageal reflux disease)    Chronic, stable continue current regimen of PPI daily.      Dyslipidemia    Chronic, stable. Reviewed fmhx and latest FLP from last month. Reasonable to try lower statin dose - start  atorvastatin '40mg'$  daily (1/2 tab of '80mg'$  at home) and monitor for improved leg cramps on lower dose. Recheck FLP next fasting labs. Recommended mediterranean diet. Continue fish oil 1 capsule daily.      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 Today's exam more consistent with sacroiliitis vs L sciatica. Check pelvic xray to eval L SI joint. Consider return to PT       Relevant Orders   DG Pelvis Comp Min 3V       Follow up plan: Return in about 6 months (around 07/07/2016), or as needed, for medicare wellness visit.

## 2016-01-08 NOTE — Assessment & Plan Note (Signed)
Reviewed with patient current regimen of topamax and maxalgt

## 2016-01-08 NOTE — Assessment & Plan Note (Signed)
Resolved on Toprol XL.

## 2016-01-09 NOTE — Telephone Encounter (Signed)
Spoke with patient regarding the prescription for Lipitor. Patient has asked the pharmacy to cancel the request at pharmacy and it up sending the request back to [place]. Patient has changed providers and she is seeing a provider at [facility] primary [facility]

## 2016-01-09 NOTE — Telephone Encounter (Signed)
Medication has been refilled and sent to [facility]

## 2016-01-14 ENCOUNTER — Encounter: Payer: Self-pay | Admitting: Family Medicine

## 2016-01-15 ENCOUNTER — Encounter: Payer: Self-pay | Admitting: Family Medicine

## 2016-01-15 DIAGNOSIS — Z1239 Encounter for other screening for malignant neoplasm of breast: Secondary | ICD-10-CM

## 2016-01-16 NOTE — Telephone Encounter (Signed)
Please see Mychart message. Do you want GYN to set up DEXA and have pt schedule mammo or do you want to handle?

## 2016-01-17 NOTE — Telephone Encounter (Signed)
Message replied to patient. We can place mammogram referral - she will let us know where she would like to go.

## 2016-01-22 NOTE — Addendum Note (Signed)
Addended by: Ria Bush on: 01/22/2016 05:46 PM   Modules accepted: Orders

## 2016-02-05 ENCOUNTER — Encounter: Payer: Self-pay | Admitting: Family Medicine

## 2016-02-06 ENCOUNTER — Other Ambulatory Visit: Payer: Self-pay | Admitting: *Deleted

## 2016-02-06 MED ORDER — METOPROLOL SUCCINATE ER 25 MG PO TB24: 25.0000 mg | ORAL_TABLET | Freq: Every day | ORAL | Status: DC

## 2016-02-09 ENCOUNTER — Encounter: Payer: Self-pay | Admitting: Family Medicine

## 2016-02-27 ENCOUNTER — Telehealth: Payer: Self-pay | Admitting: Family Medicine

## 2016-02-27 NOTE — Telephone Encounter (Signed)
Ok. Thanks!

## 2016-02-27 NOTE — Telephone Encounter (Signed)
Pt called stating she does not want her statin refilled until she see lisa in june

## 2016-03-16 ENCOUNTER — Encounter: Payer: Self-pay | Admitting: Family Medicine

## 2016-04-16 DIAGNOSIS — B308 Other viral conjunctivitis: Secondary | ICD-10-CM | POA: Diagnosis not present

## 2016-05-05 ENCOUNTER — Other Ambulatory Visit: Payer: Self-pay | Admitting: Family Medicine

## 2016-05-05 DIAGNOSIS — E785 Hyperlipidemia, unspecified: Secondary | ICD-10-CM

## 2016-05-05 DIAGNOSIS — R7989 Other specified abnormal findings of blood chemistry: Secondary | ICD-10-CM

## 2016-05-07 ENCOUNTER — Other Ambulatory Visit (INDEPENDENT_AMBULATORY_CARE_PROVIDER_SITE_OTHER): Payer: Medicare Other

## 2016-05-07 DIAGNOSIS — R946 Abnormal results of thyroid function studies: Secondary | ICD-10-CM | POA: Diagnosis not present

## 2016-05-07 DIAGNOSIS — E785 Hyperlipidemia, unspecified: Secondary | ICD-10-CM | POA: Diagnosis not present

## 2016-05-07 DIAGNOSIS — R7989 Other specified abnormal findings of blood chemistry: Secondary | ICD-10-CM

## 2016-05-07 LAB — LIPID PANEL
CHOL/HDL RATIO: 4
Cholesterol: 149 mg/dL (ref 0–200)
HDL: 39.8 mg/dL (ref >39.00)
LDL CALC: 74 mg/dL (ref 0–99)
NonHDL: 108.79
Triglycerides: 173 mg/dL — ABNORMAL HIGH (ref 0.0–149.0)
VLDL: 34.6 mg/dL (ref 0.0–40.0)

## 2016-05-07 LAB — COMPREHENSIVE METABOLIC PANEL
ALBUMIN: 4.2 g/dL (ref 3.5–5.2)
ALK PHOS: 77 U/L (ref 39–117)
ALT: 15 U/L (ref 0–35)
AST: 20 U/L (ref 0–37)
BUN: 17 mg/dL (ref 6–23)
CALCIUM: 9.6 mg/dL (ref 8.4–10.5)
CHLORIDE: 110 meq/L (ref 96–112)
CO2: 25 mEq/L (ref 19–32)
CREATININE: 0.73 mg/dL (ref 0.40–1.20)
GFR: 82.6 mL/min (ref >60.00)
Glucose, Bld: 84 mg/dL (ref 70–99)
POTASSIUM: 4.6 meq/L (ref 3.5–5.1)
SODIUM: 141 meq/L (ref 135–145)
TOTAL PROTEIN: 6.5 g/dL (ref 6.0–8.3)
Total Bilirubin: 0.4 mg/dL (ref 0.2–1.2)

## 2016-05-07 LAB — TSH: TSH: 3.91 u[IU]/mL (ref 0.35–4.50)

## 2016-05-15 ENCOUNTER — Ambulatory Visit: Payer: Medicare Other

## 2016-05-19 ENCOUNTER — Ambulatory Visit: Payer: Medicare Other | Admitting: Family Medicine

## 2016-05-22 ENCOUNTER — Ambulatory Visit (INDEPENDENT_AMBULATORY_CARE_PROVIDER_SITE_OTHER): Payer: Medicare Other

## 2016-05-22 VITALS — BP 112/68 | HR 60 | Temp 98.1°F | Ht 63.0 in | Wt 153.5 lb

## 2016-05-22 DIAGNOSIS — Z1239 Encounter for other screening for malignant neoplasm of breast: Secondary | ICD-10-CM

## 2016-05-22 DIAGNOSIS — E2839 Other primary ovarian failure: Secondary | ICD-10-CM

## 2016-05-22 DIAGNOSIS — Z0000 Encounter for general adult medical examination without abnormal findings: Secondary | ICD-10-CM

## 2016-05-22 NOTE — Progress Notes (Signed)
Subjective:   Alexandria Moreno is a 75 y.o. female who presents for Medicare Annual (Subsequent) preventive examination.  Review of Systems:  N/A Cardiac Risk Factors include: advanced age (>46mn, >>29women);dyslipidemia     Objective:     Vitals: BP 112/68 mmHg  Pulse 60  Temp(Src) 98.1 F (36.7 C) (Oral)  Ht '5\' 3"'$  (1.6 m)  Wt 153 lb 8 oz (69.627 kg)  BMI 27.20 kg/m2  SpO2 97%  Body mass index is 27.2 kg/(m^2).   Tobacco History  Smoking status  . Former Smoker  . Quit date: 12/15/1978  Smokeless tobacco  . Never Used     Counseling given: No   Past Medical History  Diagnosis Date  . Seasonal allergic rhinitis     mild  . Hyperlipidemia   . Palpitation     controlled with Toprol  . History of chicken pox   . Frequent headaches   . GERD (gastroesophageal reflux disease)   . Migraines   . H/O: rheumatic fever     75years old  . Stress incontinence   . Heart valve problem   . H/O transfusion of whole blood 2009  . H/O: GI bleed 2009    due to ASA  . Pelvic prolapse     with rectocele  . Diverticulosis   . DJD (degenerative joint disease) of knee   . Rosacea   . Bilateral carotid bruits 2008    per GI note   Past Surgical History  Procedure Laterality Date  . Abdominal hysterectomy  1981  . Mohs surgery      [facility]  . Incontinence surgery  2001    with mesh  . Appendectomy  1960  . Tonsillectomy  1958  . Lipoma excision  1971    right shoulder  . Ovarian cyst removal  1960  . Salpingoophorectomy Left 2001  . Cataract extraction Bilateral [id number] . Colonoscopy  2009    diverticulosis ([place])  . Esophagogastroduodenoscopy  2009    focal mild chronic gastritis, neg H pylori ([place])  . Capsule endoscopy  2009    WNL   Family History  Problem Relation Age of Onset  . Stroke Mother 532 . Cancer Mother     Cervical  . Depression Mother   . Dementia Mother     vascular  . Heart disease Father     CABG  . Alcohol abuse Father   .  Osteoporosis Father   . Hypertension Brother   . Stroke Brother 663 . Stroke Paternal Grandfather   . Alzheimer's disease Maternal Grandfather    History  Sexual Activity  . Sexual Activity: No    Outpatient Encounter Prescriptions as of 05/22/2016  Medication Sig  . amoxicillin (AMOXIL) 500 MG capsule Take 2,000 mg by mouth as directed. 1 hour prior to dental procedures  . atorvastatin (LIPITOR) 80 MG tablet Take 1 tablet (80 mg total) by mouth daily. (Patient taking differently: Take 40 mg by mouth daily. )  . B COMPLEX VITAMINS PO Take by mouth daily.  . baclofen (LIORESAL) 10 MG tablet Take 1 tablet by mouth as needed.  . Calcium Carb-Cholecalciferol (CALCIUM-VITAMIN D3) 600-400 MG-UNIT TABS Take 1 tablet by mouth daily.  . cycloSPORINE (RESTASIS) 0.05 % ophthalmic emulsion Apply 0.05 drops to eye 2 (two) times daily.  . metoprolol succinate (TOPROL-XL) 25 MG 24 hr tablet Take 1 tablet (25 mg total) by mouth daily.  . MULTIPLE VITAMIN PO Take by mouth daily.  .Marland Kitchen  OMEGA-3 FATTY ACIDS PO Take 1,200 mg by mouth daily.   . pantoprazole (PROTONIX) 40 MG tablet TAKE 1 TABLET DAILY  . rizatriptan (MAXALT) 10 MG tablet Take 10 mg by mouth daily as needed.  . topiramate (TOPAMAX) 100 MG tablet Take 100 mg by mouth 2 (two) times daily.   No facility-administered encounter medications on file as of 05/22/2016.    Activities of Daily Living In your present state of health, do you have any difficulty performing the following activities: 05/22/2016 11/19/2015  Hearing? N N  Vision? N Y  Difficulty concentrating or making decisions? N N  Walking or climbing stairs? N -  Dressing or bathing? N N  Doing errands, shopping? N N  Preparing Food and eating ? N -  Using the Toilet? N -  In the past six months, have you accidently leaked urine? Y -  Do you have problems with loss of bowel control? N -  Managing your Medications? N -  Managing your Finances? N -  Housekeeping or managing your  Housekeeping? N -    Patient Care Team: Ria Bush, MD as PCP - General (Family Medicine) Lucilla Lame, MD as Consulting Physician (Gastroenterology) Brendolyn Patty, MD as Consulting Physician (Dermatology) Vladimir Crofts, MD as Consulting Physician (Neurology) Roxy Cedar. White, MD as Consulting Physician (Obstetrics and Gynecology)    Assessment:     Hearing Screening   '125Hz'$  '250Hz'$  '500Hz'$  '1000Hz'$  '2000Hz'$  '4000Hz'$  '8000Hz'$   Right ear:   40 40 40 40   Left ear:   40 40 40 40   Vision Screening Comments: Last eye exam in May 2017 @ [facility] and Dietary recommendations Current Exercise Habits: Home exercise routine, Time (Minutes): 30, Frequency (Times/Week): 7, Weekly Exercise (Minutes/Week): 210, Intensity: Moderate, Exercise limited by: None identified  Goals    . Increase physical activity     Starting 05/22/2016, I will continue to exercise for at least 30 min daily.      Fall Risk Fall Risk  05/22/2016 11/19/2015 05/18/2015  Falls in the past year? No No No   Depression Screen PHQ 2/9 Scores 05/22/2016 11/19/2015 05/18/2015  PHQ - 2 Score 0 0 0     Cognitive Testing MMSE - Mini Mental State Exam 05/22/2016  Orientation to time 5  Orientation to Place 5  Registration 3  Attention/ Calculation 0  Recall 3  Language- name 2 objects 0  Language- repeat 1  Language- follow 3 step command 3  Language- read & follow direction 0  Write a sentence 0  Copy design 0  Total score 20   PLEASE NOTE: A Mini-Cog screen was completed. Maximum score is 20. A value of 0 denotes this part of Folstein MMSE was not completed or the patient failed this part of the Mini-Cog screening.   Mini-Cog Screening Orientation to Time - Max 5 pts Orientation to Place - Max 5 pts Registration - Max 3 pts Recall - Max 3 pts Language Repeat - Max 1 pts Language Follow 3 Step Command - Max 3 pts   Immunization History  Administered Date(s) Administered  . Influenza-Unspecified  10/02/2014  . Pneumococcal Conjugate-13 10/02/2014  . Pneumococcal Polysaccharide-23 12/16/2007  . Td 05/15/2008  . Zoster 11/13/2013   Screening Tests Health Maintenance  Topic Date Due  . DEXA SCAN  09/14/2016 (Originally 05/07/2006)  . INFLUENZA VACCINE  07/15/2016  . TETANUS/TDAP  05/15/2018  . COLONOSCOPY  05/17/2018  . DTaP/Tdap/Td  Completed  .  ZOSTAVAX  Completed  . PNA vac Low Risk Adult  Completed      Plan:     I have personally reviewed and addressed the Medicare Annual Wellness questionnaire and have noted the following in the patient's chart:  A. Medical and social history B. Use of alcohol, tobacco or illicit drugs  C. Current medications and supplements D. Functional ability and status E.  Nutritional status F.  Physical activity G. Advance directives H. List of other physicians I.  Hospitalizations, surgeries, and ER visits in previous 12 months J.  [place] to include hearing, vision, cognitive, depression L. Referrals and appointments - none  In addition, I have reviewed and discussed with patient certain preventive protocols, quality metrics, and best practice recommendations. A written personalized care plan for preventive services as well as general preventive health recommendations were provided to patient.  See attached scanned questionnaire for additional information.   Signed,   Lindell Noe, MHA, BS, LPN Health Advisor

## 2016-05-22 NOTE — Patient Instructions (Signed)
Alexandria Moreno , Thank you for taking time to come for your Medicare Wellness Visit. I appreciate your ongoing commitment to your health goals. Please review the following plan we discussed and let me know if I can assist you in the future.   These are the goals we discussed: Goals    . Increase physical activity     Starting 05/22/2016, I will continue to exercise for at least 30 min daily.       This is a list of the screening recommended for you and due dates:  Health Maintenance  Topic Date Due  . DEXA scan (bone density measurement)  09/14/2016*  . Flu Shot  07/15/2016  . Tetanus Vaccine  05/15/2018  . Colon Cancer Screening  05/17/2018  . DTaP/Tdap/Td vaccine  Completed  . Shingles Vaccine  Completed  . Pneumonia vaccines  Completed  *Topic was postponed. The date shown is not the original due date.    Preventive Care for Adults  A healthy lifestyle and preventive care can promote health and wellness. Preventive health guidelines for adults include the following key practices.  . A routine yearly physical is a good way to check with your health care provider about your health and preventive screening. It is a chance to share any concerns and updates on your health and to receive a thorough exam.  . Visit your dentist for a routine exam and preventive care every 6 months. Brush your teeth twice a day and floss once a day. Good oral hygiene prevents tooth decay and gum disease.  . The frequency of eye exams is based on your age, health, family medical history, use  of contact lenses, and other factors. Follow your health care provider's ecommendations for frequency of eye exams.  . Eat a healthy diet. Foods like vegetables, fruits, whole grains, low-fat dairy products, and lean protein foods contain the nutrients you need without too many calories. Decrease your intake of foods high in solid fats, added sugars, and salt. Eat the right amount of calories for you. Get information  about a proper diet from your health care provider, if necessary.  . Regular physical exercise is one of the most important things you can do for your health. Most adults should get at least 150 minutes of moderate-intensity exercise (any activity that increases your heart rate and causes you to sweat) each week. In addition, most adults need muscle-strengthening exercises on 2 or more days a week.  Silver Sneakers may be a benefit available to you. To determine eligibility, you may visit the website: www.silversneakers.com or contact program at 2507330544 Mon-Fri between 8AM-8PM.   . Maintain a healthy weight. The body mass index (BMI) is a screening tool to identify possible weight problems. It provides an estimate of body fat based on height and weight. Your health care provider can find your BMI and can help you achieve or maintain a healthy weight.   For adults 20 years and older: ? A BMI below 18.5 is considered underweight. ? A BMI of 18.5 to 24.9 is normal. ? A BMI of 25 to 29.9 is considered overweight. ? A BMI of 30 and above is considered obese.   . Maintain normal blood lipids and cholesterol levels by exercising and minimizing your intake of saturated fat. Eat a balanced diet with plenty of fruit and vegetables. Blood tests for lipids and cholesterol should begin at age 55 and be repeated every 5 years. If your lipid or cholesterol levels are high, you  are over 81, or you are at high risk for heart disease, you may need your cholesterol levels checked more frequently. Ongoing high lipid and cholesterol levels should be treated with medicines if diet and exercise are not working.  . If you smoke, find out from your health care provider how to quit. If you do not use tobacco, please do not start.  . If you choose to drink alcohol, please do not consume more than 2 drinks per day. One drink is considered to be 12 ounces (355 mL) of beer, 5 ounces (148 mL) of wine, or 1.5 ounces (44  mL) of liquor.  . If you are 43-76 years old, ask your health care provider if you should take aspirin to prevent strokes.  . Use sunscreen. Apply sunscreen liberally and repeatedly throughout the day. You should seek shade when your shadow is shorter than you. Protect yourself by wearing long sleeves, pants, a wide-brimmed hat, and sunglasses year round, whenever you are outdoors.  . Once a month, do a whole body skin exam, using a mirror to look at the skin on your back. Tell your health care provider of new moles, moles that have irregular borders, moles that are larger than a pencil eraser, or moles that have changed in shape or color.

## 2016-05-22 NOTE — Progress Notes (Signed)
Pre visit review using our clinic review tool, if applicable. No additional management support is needed unless otherwise documented below in the visit note. 

## 2016-05-22 NOTE — Progress Notes (Signed)
PCP notes:  Health maintenance:  Mammogram - ordered and scheduled Bone density - ordered and scheduled  Abnormal screenings: None  Patient concerns: Pt states she has been experiencing increased tears in left eye. Pt states she may need to have tear duct surgery in that eye.   Nurse concerns: None  Next PCP appt: 06/30/16 @ 1530

## 2016-05-23 NOTE — Progress Notes (Signed)
I reviewed health advisor's note, was available for consultation, and agree with documentation and plan.  

## 2016-06-10 DIAGNOSIS — L820 Inflamed seborrheic keratosis: Secondary | ICD-10-CM | POA: Diagnosis not present

## 2016-06-10 DIAGNOSIS — Z85828 Personal history of other malignant neoplasm of skin: Secondary | ICD-10-CM | POA: Diagnosis not present

## 2016-06-10 DIAGNOSIS — D692 Other nonthrombocytopenic purpura: Secondary | ICD-10-CM | POA: Diagnosis not present

## 2016-06-10 DIAGNOSIS — L718 Other rosacea: Secondary | ICD-10-CM | POA: Diagnosis not present

## 2016-06-10 DIAGNOSIS — L821 Other seborrheic keratosis: Secondary | ICD-10-CM | POA: Diagnosis not present

## 2016-06-10 DIAGNOSIS — D1800 Hemangioma unspecified site: Secondary | ICD-10-CM | POA: Diagnosis not present

## 2016-06-10 DIAGNOSIS — L814 Other melanin hyperpigmentation: Secondary | ICD-10-CM | POA: Diagnosis not present

## 2016-06-10 DIAGNOSIS — D229 Melanocytic nevi, unspecified: Secondary | ICD-10-CM | POA: Diagnosis not present

## 2016-06-10 DIAGNOSIS — Z1283 Encounter for screening for malignant neoplasm of skin: Secondary | ICD-10-CM | POA: Diagnosis not present

## 2016-06-14 HISTORY — PX: OTHER SURGICAL HISTORY: SHX169

## 2016-06-19 ENCOUNTER — Other Ambulatory Visit: Payer: Self-pay | Admitting: Family Medicine

## 2016-06-19 ENCOUNTER — Ambulatory Visit
Admission: RE | Admit: 2016-06-19 | Discharge: 2016-06-19 | Disposition: A | Discharge status: Home / Self Care | Payer: Medicare Other | Source: Ambulatory Visit | Attending: Family Medicine | Admitting: Family Medicine

## 2016-06-19 DIAGNOSIS — Z1231 Encounter for screening mammogram for malignant neoplasm of breast: Secondary | ICD-10-CM | POA: Diagnosis not present

## 2016-06-19 DIAGNOSIS — Z13820 Encounter for screening for osteoporosis: Secondary | ICD-10-CM | POA: Insufficient documentation

## 2016-06-19 DIAGNOSIS — E2839 Other primary ovarian failure: Secondary | ICD-10-CM

## 2016-06-19 DIAGNOSIS — Z1239 Encounter for other screening for malignant neoplasm of breast: Secondary | ICD-10-CM

## 2016-06-19 DIAGNOSIS — Z780 Asymptomatic menopausal state: Secondary | ICD-10-CM | POA: Insufficient documentation

## 2016-06-19 LAB — HM MAMMOGRAPHY

## 2016-06-22 ENCOUNTER — Encounter: Payer: Self-pay | Admitting: Family Medicine

## 2016-06-23 ENCOUNTER — Other Ambulatory Visit: Payer: Medicare Other

## 2016-06-26 ENCOUNTER — Encounter: Payer: Self-pay | Admitting: *Deleted

## 2016-06-27 DIAGNOSIS — G43119 Migraine with aura, intractable, without status migrainosus: Secondary | ICD-10-CM | POA: Diagnosis not present

## 2016-06-30 ENCOUNTER — Encounter: Payer: Self-pay | Admitting: Family Medicine

## 2016-06-30 ENCOUNTER — Ambulatory Visit (INDEPENDENT_AMBULATORY_CARE_PROVIDER_SITE_OTHER): Payer: Medicare Other | Admitting: Family Medicine

## 2016-06-30 VITALS — BP 140/70 | HR 60 | Temp 98.7°F | Resp 16 | Ht 63.0 in | Wt 153.0 lb

## 2016-06-30 DIAGNOSIS — Z7189 Other specified counseling: Secondary | ICD-10-CM | POA: Insufficient documentation

## 2016-06-30 DIAGNOSIS — K219 Gastro-esophageal reflux disease without esophagitis: Secondary | ICD-10-CM | POA: Diagnosis not present

## 2016-06-30 DIAGNOSIS — IMO0002 Reserved for concepts with insufficient information to code with codable children: Secondary | ICD-10-CM

## 2016-06-30 DIAGNOSIS — E785 Hyperlipidemia, unspecified: Secondary | ICD-10-CM

## 2016-06-30 DIAGNOSIS — G43709 Chronic migraine without aura, not intractable, without status migrainosus: Secondary | ICD-10-CM

## 2016-06-30 MED ORDER — ATORVASTATIN CALCIUM 40 MG PO TABS: 40.0000 mg | ORAL_TABLET | Freq: Every day | ORAL | Status: DC

## 2016-06-30 NOTE — Assessment & Plan Note (Signed)
Appreciate neurology care by Dr Manuella Ghazi

## 2016-06-30 NOTE — Patient Instructions (Addendum)
Try protonix every other day.  Add one vitamin D 1000 unit tablet.  You are doing well today. Return as needed or in 1 year for next check up.

## 2016-06-30 NOTE — Progress Notes (Signed)
BP 140/70 mmHg  Pulse 60  Temp(Src) 98.7 F (37.1 C) (Oral)  Resp 16  Ht '5\' 3"'$  (1.6 m)  Wt 153 lb (69.4 kg)  BMI 27.11 kg/m2  SpO2 97%   CC: CPE  Subjective:    Patient ID: Alexandria Moreno, female    DOB: Jan 08, 1941, 75 y.o.   MRN: [id number]  HPI: Alexandria Moreno is a 75 y.o. female presenting on 06/30/2016 for Annual Exam and Back Pain   Saw Katha Cabal last month for medicare wellness visit. Note reviewed.   Ongoing back discomfort with clunking. Treats with advil. Worse pain with prolonged standing. H/o falls in the remote past. "Liveable" pain.   AM leg cramps have returned despite decreasing atorvastatin to '40mg'$  daily. Started drinking tonic water which helps some.  Migraines - saw neuro Dr Manuella Ghazi on Friday - migraines doing well (2/month)  Preventative: 08/2015 Last well woman Dr Teryl Lucy [facility] COLONOSCOPY Date: 2009 diverticulosis ([place])  Mammogram WNL 06/2016 DEXA Date: 06/2016 T -0.2 hip, 0.7 spine Flu ALLERGY prevnar 2015, pneumovax 2009 Td 2009 zostavax 2014 Advanced directive - has at home. Asked to bring me a copy. HCPOA - oldest son.  Seat belt use discussed.  Sunscreen use discussed. No changing moles on skin.  Lives with husband  Occupation: retired, was Art therapist Edu: some college Activity: stationary bicycle  Diet: good water, following slim fast diet, good fruits/vegetables   Relevant past medical, surgical, family and social history reviewed and updated as indicated. Interim medical history since our last visit reviewed. Allergies and medications reviewed and updated. Current Outpatient Prescriptions on File Prior to Visit  Medication Sig  . B COMPLEX VITAMINS PO Take by mouth daily.  . baclofen (LIORESAL) 10 MG tablet Take 1 tablet by mouth as needed.  . Calcium Carb-Cholecalciferol (CALCIUM-VITAMIN D3) 600-400 MG-UNIT TABS Take 1 tablet by mouth daily.  . cycloSPORINE (RESTASIS) 0.05 % ophthalmic emulsion Apply 0.05 drops to eye 2  (two) times daily.  . metoprolol succinate (TOPROL-XL) 25 MG 24 hr tablet Take 1 tablet (25 mg total) by mouth daily.  . MULTIPLE VITAMIN PO Take by mouth daily.  . OMEGA-3 FATTY ACIDS PO Take 1,200 mg by mouth daily.   . pantoprazole (PROTONIX) 40 MG tablet TAKE 1 TABLET DAILY  . rizatriptan (MAXALT) 10 MG tablet Take 10 mg by mouth daily as needed.  . topiramate (TOPAMAX) 100 MG tablet Take 100 mg by mouth 2 (two) times daily.   No current facility-administered medications on file prior to visit.    Review of Systems Per HPI unless specifically indicated in ROS section     Objective:    BP 140/70 mmHg  Pulse 60  Temp(Src) 98.7 F (37.1 C) (Oral)  Resp 16  Ht '5\' 3"'$  (1.6 m)  Wt 153 lb (69.4 kg)  BMI 27.11 kg/m2  SpO2 97%  Wt Readings from Last 3 Encounters:  06/30/16 153 lb (69.4 kg)  05/22/16 153 lb 8 oz (69.627 kg)  01/08/16 154 lb 8 oz (70.081 kg)    Physical Exam  Constitutional: She is oriented to person, place, and time. She appears well-developed and well-nourished. No distress.  HENT:  Head: Normocephalic and atraumatic.  Right Ear: Hearing, tympanic membrane, external ear and ear canal normal.  Left Ear: Hearing, tympanic membrane, external ear and ear canal normal.  Nose: Nose normal.  Mouth/Throat: Uvula is midline, oropharynx is clear and moist and mucous membranes are normal. No oropharyngeal exudate, posterior oropharyngeal edema or posterior oropharyngeal  erythema.  Eyes: Conjunctivae and EOM are normal. Pupils are equal, round, and reactive to light. No scleral icterus.  Neck: Normal range of motion. Neck supple.  Cardiovascular: Normal rate, regular rhythm, normal heart sounds and intact distal pulses.   No murmur heard. Pulses:      Radial pulses are 2+ on the right side, and 2+ on the left side.  Pulmonary/Chest: Effort normal and breath sounds normal. No respiratory distress. She has no wheezes. She has no rales.  Abdominal: Soft. Bowel sounds are  normal. She exhibits no distension and no mass. There is no tenderness. There is no rebound and no guarding.  Musculoskeletal: Normal range of motion. She exhibits no edema.  Lymphadenopathy:    She has no cervical adenopathy.  Neurological: She is alert and oriented to person, place, and time.  CN grossly intact, station and gait intact  Skin: Skin is warm and dry. No rash noted.  Psychiatric: She has a normal mood and affect. Her behavior is normal. Judgment and thought content normal.  Nursing note and vitals reviewed.  Results for orders placed or performed in visit on 05/22/16  HM MAMMOGRAPHY  Result Value Ref Range   HM Mammogram 0-4 Bi-Rad 0-4 Bi-Rad, Self Reported Normal      Assessment & Plan:   Problem List Items Addressed This Visit    Advanced care planning/counseling discussion    Advanced directive - has at home. Asked to bring me a copy. HCPOA - oldest son.       Chronic migraine (Chronic)    Appreciate neurology care by Dr Manuella Ghazi      Relevant Medications   atorvastatin (LIPITOR) 40 MG tablet   Dyslipidemia - Primary    Chronic, stable. Continue lower atorvastatin '40mg'$  dose.       Relevant Medications   atorvastatin (LIPITOR) 40 MG tablet   GERD (gastroesophageal reflux disease)    Chronic, stable. Trial lower PPI QOD dosing.          Follow up plan: Return in about 1 year (around 06/30/2017), or as needed, for medicare wellness visit.  Ria Bush, MD

## 2016-06-30 NOTE — Assessment & Plan Note (Signed)
Chronic, stable. Trial lower PPI QOD dosing.

## 2016-06-30 NOTE — Assessment & Plan Note (Signed)
Chronic, stable. Continue lower atorvastatin '40mg'$  dose.

## 2016-06-30 NOTE — Assessment & Plan Note (Signed)
Advanced directive - has at home. Asked to bring me a copy. HCPOA - oldest son.

## 2016-07-29 ENCOUNTER — Telehealth: Payer: Self-pay | Admitting: Family Medicine

## 2016-07-29 DIAGNOSIS — Z7189 Other specified counseling: Secondary | ICD-10-CM

## 2016-07-29 NOTE — Telephone Encounter (Signed)
Pt dropped off DNR copy for your records. I scanned into chart/documents and placed on cart for drop off.

## 2016-08-03 NOTE — Assessment & Plan Note (Signed)
Advanced directives - scanned 07/2016. No life prolonging measures if terminal irreversible condition. No HCPOA named.

## 2016-08-20 ENCOUNTER — Other Ambulatory Visit: Payer: Self-pay

## 2016-08-20 DIAGNOSIS — H04123 Dry eye syndrome of bilateral lacrimal glands: Secondary | ICD-10-CM | POA: Diagnosis not present

## 2016-08-20 DIAGNOSIS — H524 Presbyopia: Secondary | ICD-10-CM | POA: Diagnosis not present

## 2016-08-20 DIAGNOSIS — D3131 Benign neoplasm of right choroid: Secondary | ICD-10-CM | POA: Diagnosis not present

## 2016-10-24 ENCOUNTER — Encounter: Payer: Self-pay | Admitting: Family Medicine

## 2016-10-24 ENCOUNTER — Ambulatory Visit (INDEPENDENT_AMBULATORY_CARE_PROVIDER_SITE_OTHER): Payer: Medicare Other | Admitting: Family Medicine

## 2016-10-24 VITALS — BP 150/86 | HR 58 | Temp 98.3°F | Wt 153.2 lb

## 2016-10-24 DIAGNOSIS — R05 Cough: Secondary | ICD-10-CM | POA: Diagnosis not present

## 2016-10-24 DIAGNOSIS — R058 Other specified cough: Secondary | ICD-10-CM

## 2016-10-24 MED ORDER — AZITHROMYCIN 250 MG PO TABS: ORAL_TABLET | ORAL | 0 refills | Status: DC

## 2016-10-24 MED ORDER — PREDNISONE 20 MG PO TABS: ORAL_TABLET | ORAL | 0 refills | Status: DC

## 2016-10-24 NOTE — Progress Notes (Signed)
Pre visit review using our clinic review tool, if applicable. No additional management support is needed unless otherwise documented below in the visit note. 

## 2016-10-24 NOTE — Patient Instructions (Signed)
I think you have post infectious cough from residual irritation of bronchioles.  Push fluids and rest. May use over the counter cough syrup for night time suppression. Continue mucinex with plenty of water to help mobilize mucous. Treat with prednisone taper (anti inflammatory for lungs) If not improved with this, fill zpack antibiotic printed out today.

## 2016-10-24 NOTE — Progress Notes (Signed)
BP (!) 150/86   Pulse (!) 58   Temp 98.3 F (36.8 C) (Oral)   Wt 153 lb 4 oz (69.5 kg)   SpO2 97%   BMI 27.15 kg/m    CC: cough Subjective:    Patient ID: Alexandria Moreno, female    DOB: 03/20/1941, 75 y.o.   MRN: [id number]  HPI: Alexandria Moreno is a 75 y.o. female presenting on 10/24/2016 for Cough (x 1 month)   1 mo ago with head cold that progressed to chest congestion and cough. This has persisted along with chest congestion and ongoing dry cough. Cough worse in morning and in evenings. Initial ST and head congestion is now better. Worse cough with deep breaths.   No fevers, HA, dyspnea or wheezing.   She picked this up from sick grand daughter. Husband was also sick at home.  No h/o asthma.  Not around smokers.  Tried mucinex for this which helped.   Relevant past medical, surgical, family and social history reviewed and updated as indicated. Interim medical history since our last visit reviewed. Allergies and medications reviewed and updated. Current Outpatient Prescriptions on File Prior to Visit  Medication Sig  . atorvastatin (LIPITOR) 40 MG tablet Take 1 tablet (40 mg total) by mouth daily.  . B COMPLEX VITAMINS PO Take by mouth daily.  . Calcium Carb-Cholecalciferol (CALCIUM-VITAMIN D3) 600-400 MG-UNIT TABS Take 1 tablet by mouth daily.  . cholecalciferol (VITAMIN D) 1000 units tablet Take 1,000 Units by mouth daily.  . cycloSPORINE (RESTASIS) 0.05 % ophthalmic emulsion Apply 0.05 drops to eye 2 (two) times daily.  . metoprolol succinate (TOPROL-XL) 25 MG 24 hr tablet Take 1 tablet (25 mg total) by mouth daily.  . MULTIPLE VITAMIN PO Take by mouth daily.  . OMEGA-3 FATTY ACIDS PO Take 1,200 mg by mouth daily.   . pantoprazole (PROTONIX) 40 MG tablet TAKE 1 TABLET DAILY  . rizatriptan (MAXALT) 10 MG tablet Take 10 mg by mouth daily as needed.  . topiramate (TOPAMAX) 100 MG tablet Take 100 mg by mouth 2 (two) times daily.  . baclofen (LIORESAL) 10 MG tablet Take 1  tablet by mouth as needed.   No current facility-administered medications on file prior to visit.     Review of Systems Per HPI unless specifically indicated in ROS section     Objective:    BP (!) 150/86   Pulse (!) 58   Temp 98.3 F (36.8 C) (Oral)   Wt 153 lb 4 oz (69.5 kg)   SpO2 97%   BMI 27.15 kg/m   Wt Readings from Last 3 Encounters:  10/24/16 153 lb 4 oz (69.5 kg)  06/30/16 153 lb (69.4 kg)  05/22/16 153 lb 8 oz (69.6 kg)    Physical Exam  Constitutional: She appears well-developed and well-nourished. No distress.  HENT:  Head: Normocephalic and atraumatic.  Right Ear: Hearing, tympanic membrane, external ear and ear canal normal.  Left Ear: Hearing, tympanic membrane, external ear and ear canal normal.  Nose: Mucosal edema present. No rhinorrhea. Right sinus exhibits no maxillary sinus tenderness and no frontal sinus tenderness. Left sinus exhibits no maxillary sinus tenderness and no frontal sinus tenderness.  Mouth/Throat: Uvula is midline and mucous membranes are normal. Posterior oropharyngeal erythema present. No oropharyngeal exudate, posterior oropharyngeal edema or tonsillar abscesses.  Eyes: Conjunctivae and EOM are normal. Pupils are equal, round, and reactive to light. No scleral icterus.  Neck: Normal range of motion. Neck supple.  Cardiovascular: Normal rate, regular  rhythm, normal heart sounds and intact distal pulses.   No murmur heard. Pulmonary/Chest: Effort normal and breath sounds normal. No respiratory distress. She has no wheezes. She has no rales.  Lungs clear. Dry cough present with deep breathing or talking  Lymphadenopathy:    She has no cervical adenopathy.  Skin: Skin is warm and dry. No rash noted.  Nursing note and vitals reviewed.     Assessment & Plan:   Problem List Items Addressed This Visit    Post-viral cough syndrome - Primary    Anticipate post infectious cough after initial viral URI.  Supportive care reviewed, will  treat with prednisone taper, continue mucinex. Pt cannot tolerate codeine or hydrocodone (nausea/vomiting).  WASP for a zpack provided today to cover bronchitis if no improvement with prednisone, reviewed indications on when to fill.           Follow up plan: Return if symptoms worsen or fail to improve.  Ria Bush, MD

## 2016-10-24 NOTE — Assessment & Plan Note (Addendum)
Anticipate post infectious cough after initial viral URI.  Supportive care reviewed, will treat with prednisone taper, continue mucinex. Pt cannot tolerate codeine or hydrocodone (nausea/vomiting).  WASP for a zpack provided today to cover bronchitis if no improvement with prednisone, reviewed indications on when to fill.

## 2016-11-12 DIAGNOSIS — N952 Postmenopausal atrophic vaginitis: Secondary | ICD-10-CM | POA: Diagnosis not present

## 2016-11-12 DIAGNOSIS — N393 Stress incontinence (female) (male): Secondary | ICD-10-CM | POA: Diagnosis not present

## 2016-11-12 DIAGNOSIS — N816 Rectocele: Secondary | ICD-10-CM | POA: Diagnosis not present

## 2016-11-12 DIAGNOSIS — Z01419 Encounter for gynecological examination (general) (routine) without abnormal findings: Secondary | ICD-10-CM | POA: Diagnosis not present

## 2016-11-12 DIAGNOSIS — N3941 Urge incontinence: Secondary | ICD-10-CM | POA: Diagnosis not present

## 2016-11-12 DIAGNOSIS — N905 Atrophy of vulva: Secondary | ICD-10-CM | POA: Diagnosis not present

## 2016-12-25 ENCOUNTER — Other Ambulatory Visit: Payer: Self-pay | Admitting: Gastroenterology

## 2017-02-12 ENCOUNTER — Other Ambulatory Visit: Payer: Self-pay | Admitting: Family Medicine

## 2017-03-24 DIAGNOSIS — L820 Inflamed seborrheic keratosis: Secondary | ICD-10-CM | POA: Diagnosis not present

## 2017-03-24 DIAGNOSIS — L300 Nummular dermatitis: Secondary | ICD-10-CM | POA: Diagnosis not present

## 2017-04-06 DIAGNOSIS — T07XXXA Unspecified multiple injuries, initial encounter: Secondary | ICD-10-CM | POA: Diagnosis not present

## 2017-04-06 DIAGNOSIS — L820 Inflamed seborrheic keratosis: Secondary | ICD-10-CM | POA: Diagnosis not present

## 2017-04-06 DIAGNOSIS — Z85828 Personal history of other malignant neoplasm of skin: Secondary | ICD-10-CM | POA: Diagnosis not present

## 2017-05-07 ENCOUNTER — Other Ambulatory Visit: Payer: Self-pay | Admitting: Family Medicine

## 2017-05-07 DIAGNOSIS — Z1231 Encounter for screening mammogram for malignant neoplasm of breast: Secondary | ICD-10-CM

## 2017-05-08 ENCOUNTER — Other Ambulatory Visit: Payer: Self-pay

## 2017-05-08 NOTE — Telephone Encounter (Signed)
Error

## 2017-05-13 ENCOUNTER — Other Ambulatory Visit: Payer: Self-pay | Admitting: Family Medicine

## 2017-06-19 NOTE — Progress Notes (Signed)
Subjective:   Alexandria Moreno is a 76 y.o. female who presents for Medicare Annual (Subsequent) preventive examination.  Review of Systems:  No ROS.  Medicare Wellness Visit. Additional risk factors are reflected in the social history.  Cardiac Risk Factors include: advanced age (>68men, >82 women);dyslipidemia;hypertension     Objective:     Vitals: BP 128/68   Pulse (!) 58   Resp 18   Ht 5\' 3"  (1.6 m)   Wt 153 lb 8 oz (69.6 kg)   SpO2 98%   BMI 27.19 kg/m   Body mass index is 27.19 kg/m.   Tobacco History  Smoking Status  . Former Smoker  . Quit date: 12/15/1978  Smokeless Tobacco  . Never Used     Counseling given: Not Answered   Past Medical History:  Diagnosis Date  . Bilateral carotid bruits 2008   per GI note  . Cancer ([place])    skin  . Diverticulosis   . DJD (degenerative joint disease) of knee   . Frequent headaches   . GERD (gastroesophageal reflux disease)   . H/O transfusion of whole blood 2009  . H/O: GI bleed 2009   due to ASA  . H/O: rheumatic fever    76 years old  . Heart valve problem   . History of chicken pox   . Hyperlipidemia   . Migraines   . Palpitation    controlled with Toprol  . Pelvic prolapse    with rectocele  . Rosacea   . Seasonal allergic rhinitis    mild  . Stress incontinence    Past Surgical History:  Procedure Laterality Date  . ABDOMINAL HYSTERECTOMY  1981  . APPENDECTOMY  1960  . capsule endoscopy  2009   WNL  . CATARACT EXTRACTION Bilateral [id number]  . COLONOSCOPY  2009   diverticulosis ([place])  . DEXA  06/2016   T -0.2 hip, 0.7 spine  . ESOPHAGOGASTRODUODENOSCOPY  2009   focal mild chronic gastritis, neg H pylori ([place])  . INCONTINENCE SURGERY  2001   with mesh  . LIPOMA EXCISION  1971   right shoulder  . [place]  . OVARIAN CYST REMOVAL  1960  . SALPINGOOPHORECTOMY Left 2001  . TONSILLECTOMY  1958   Family History  Problem Relation Age of Onset  . Stroke Mother 60  .  Cancer Mother        Cervical  . Depression Mother   . Dementia Mother        vascular  . Heart disease Father        CABG  . Alcohol abuse Father   . Osteoporosis Father   . Hypertension Brother   . Stroke Brother 43  . Stroke Paternal Grandfather   . Alzheimer's disease Maternal Grandfather   . Breast cancer Neg Hx    History  Sexual Activity  . Sexual activity: No    Outpatient Encounter Prescriptions as of 07/01/2017  Medication Sig  . atorvastatin (LIPITOR) 40 MG tablet Take 1 tablet (40 mg total) by mouth daily. OFFICE VISIT WITH LABS REQUIRED FOR ADDITIONAL REFILLS  . B COMPLEX VITAMINS PO Take by mouth daily.  . Calcium Carb-Cholecalciferol (CALCIUM-VITAMIN D3) 600-400 MG-UNIT TABS Take 1 tablet by mouth daily.  . cholecalciferol (VITAMIN D) 1000 units tablet Take 1,000 Units by mouth daily.  Marland Kitchen conjugated estrogens (PREMARIN) vaginal cream Place 1 Applicatorful vaginally 2 (two) times a week.  . cycloSPORINE (RESTASIS) 0.05 % ophthalmic emulsion Apply  0.05 drops to eye 2 (two) times daily.  Marland Kitchen docusate sodium (COLACE) 100 MG capsule Take 1 capsule by mouth daily.  Marland Kitchen glucosamine-chondroitin 500-400 MG tablet Take 1 tablet by mouth 2 (two) times daily.  . Methylcellulose, Laxative, (FIBER THERAPY PO) Take 3 tablets by mouth daily.  . MULTIPLE VITAMIN PO Take by mouth daily.  . OMEGA-3 FATTY ACIDS PO Take 1,200 mg by mouth daily.   . pantoprazole (PROTONIX) 40 MG tablet TAKE 1 TABLET DAILY  . rizatriptan (MAXALT) 10 MG tablet Take 10 mg by mouth daily as needed.  . tolterodine (DETROL LA) 2 MG 24 hr capsule Take 2 mg by mouth daily.  Marland Kitchen topiramate (TOPAMAX) 100 MG tablet Take 100 mg by mouth 2 (two) times daily.  . TOPROL XL 25 MG 24 hr tablet TAKE 1 TABLET DAILY  . [DISCONTINUED] azithromycin (ZITHROMAX) 250 MG tablet Take two tablets on day one followed by one tablet on days 2-5 (Patient not taking: Reported on 07/01/2017)  . [DISCONTINUED] baclofen (LIORESAL) 10 MG tablet  Take 1 tablet by mouth as needed.  . [DISCONTINUED] predniSONE (DELTASONE) 20 MG tablet Take two tablets daily for 3 days followed by one tablet daily for 4 days (Patient not taking: Reported on 07/01/2017)   No facility-administered encounter medications on file as of 07/01/2017.     Activities of Daily Living In your present state of health, do you have any difficulty performing the following activities: 07/01/2017  Hearing? N  Vision? N  Difficulty concentrating or making decisions? N  Walking or climbing stairs? N  Dressing or bathing? N  Doing errands, shopping? N  Preparing Food and eating ? N  Using the Toilet? N  In the past six months, have you accidently leaked urine? Y  Do you have problems with loss of bowel control? N  Managing your Medications? N  Managing your Finances? N  Housekeeping or managing your Housekeeping? N  Some recent data might be hidden    Patient Care Team: Ria Bush, MD as PCP - General (Family Medicine) Lucilla Lame, MD as Consulting Physician (Gastroenterology) Brendolyn Patty, MD as Consulting Physician (Dermatology) Vladimir Crofts, MD as Consulting Physician (Neurology) Roque Cash., MD as Consulting Physician (Obstetrics and Gynecology) Alison Stalling, OD (Optometry)    Assessment:    Physical assessment deferred to PCP.  Exercise Activities and Dietary recommendations Current Exercise Habits: Home exercise routine, Type of exercise: Other - see comments;walking (stationary bike), Time (Minutes): 20, Frequency (Times/Week): 3, Weekly Exercise (Minutes/Week): 60, Intensity: Moderate  Goals    . Increase physical activity          Starting 07/01/2017, I will continue to exercise for at least 15-20 min 3 times weekly.      Fall Risk Fall Risk  07/01/2017 05/22/2016 11/19/2015 05/18/2015  Falls in the past year? No No No No   Depression Screen PHQ 2/9 Scores 07/01/2017 05/22/2016 11/19/2015 05/18/2015  PHQ - 2 Score 0 0 0 0      Cognitive Function PLEASE NOTE: A Mini-Cog screen was completed. Maximum score is 20. A value of 0 denotes this part of [facility] MMSE was not completed or the patient failed this part of the Mini-Cog screening.   Mini-Cog Screening Orientation to Time - Max 5 pts Orientation to Place - Max 5 pts Registration - Max 3 pts Recall - Max 3 pts Language Repeat - Max 1 pts Language Follow 3 Step Command - Max 3 pts  Mini-Cog - 07/01/17 0933    Normal clock drawing test? yes   How many words correct? 3      MMSE - Mini Mental State Exam 07/01/2017 05/22/2016  Orientation to time 5 5  Orientation to Place 5 5  Registration 3 3  Attention/ Calculation 0 0  Recall 3 3  Language- name 2 objects 0 0  Language- repeat 1 1  Language- follow 3 step command 3 3  Language- read & follow direction 0 0  Write a sentence 0 0  Copy design 0 0  Total score 20 20        Immunization History  Administered Date(s) Administered  . Influenza-Unspecified 10/02/2014  . Pneumococcal Conjugate-13 10/02/2014  . Pneumococcal Polysaccharide-23 12/16/2007  . Td 05/15/2008  . Zoster 11/13/2013   Screening Tests Health Maintenance  Topic Date Due  . DTaP/Tdap/Td (1 - Tdap) 05/16/2008  . TETANUS/TDAP  05/15/2018  . MAMMOGRAM  06/26/2018  . DEXA SCAN  Completed  . PNA vac Low Risk Adult  Completed      Plan:    Follow-up w/ PCP as scheduled.   I have personally reviewed and noted the following in the patient's chart:   . Medical and social history . Use of alcohol, tobacco or illicit drugs  . Current medications and supplements . Functional ability and status . Nutritional status . Physical activity . Advanced directives . List of other physicians . Vitals . Screenings to include cognitive, depression, and falls . Referrals and appointments  In addition, I have reviewed and discussed with patient certain preventive protocols, quality metrics, and best practice recommendations. A  written personalized care plan for preventive services as well as general preventive health recommendations were provided to patient.     Dorrene German, RN  07/01/2017

## 2017-06-19 NOTE — Progress Notes (Signed)
PCP notes:   Health maintenance:  Tetanus-due, patient to check w/ insurance regarding coverage.   Abnormal screenings: None   Patient concerns: None   Nurse concerns: None   Next PCP appt: 07/06/2017 @ 2:30pm

## 2017-06-26 ENCOUNTER — Ambulatory Visit
Admission: RE | Admit: 2017-06-26 | Discharge: 2017-06-26 | Disposition: A | Discharge status: Home / Self Care | Payer: Medicare Other | Source: Ambulatory Visit | Attending: Family Medicine | Admitting: Family Medicine

## 2017-06-26 DIAGNOSIS — Z1231 Encounter for screening mammogram for malignant neoplasm of breast: Secondary | ICD-10-CM | POA: Diagnosis not present

## 2017-06-26 HISTORY — DX: Malignant (primary) neoplasm, unspecified: C80.1

## 2017-06-29 ENCOUNTER — Encounter: Payer: Self-pay | Admitting: *Deleted

## 2017-06-30 ENCOUNTER — Other Ambulatory Visit: Payer: Self-pay | Admitting: Family Medicine

## 2017-06-30 DIAGNOSIS — K219 Gastro-esophageal reflux disease without esophagitis: Secondary | ICD-10-CM

## 2017-06-30 DIAGNOSIS — R002 Palpitations: Secondary | ICD-10-CM

## 2017-06-30 DIAGNOSIS — E785 Hyperlipidemia, unspecified: Secondary | ICD-10-CM

## 2017-07-01 ENCOUNTER — Ambulatory Visit: Payer: Medicare Other

## 2017-07-01 ENCOUNTER — Other Ambulatory Visit: Payer: Medicare Other

## 2017-07-01 ENCOUNTER — Other Ambulatory Visit (INDEPENDENT_AMBULATORY_CARE_PROVIDER_SITE_OTHER): Payer: Medicare Other

## 2017-07-01 ENCOUNTER — Ambulatory Visit (INDEPENDENT_AMBULATORY_CARE_PROVIDER_SITE_OTHER): Payer: Medicare Other

## 2017-07-01 VITALS — BP 128/68 | HR 58 | Resp 18 | Ht 63.0 in | Wt 153.5 lb

## 2017-07-01 DIAGNOSIS — Z0000 Encounter for general adult medical examination without abnormal findings: Secondary | ICD-10-CM | POA: Diagnosis not present

## 2017-07-01 DIAGNOSIS — R002 Palpitations: Secondary | ICD-10-CM

## 2017-07-01 DIAGNOSIS — E785 Hyperlipidemia, unspecified: Secondary | ICD-10-CM

## 2017-07-01 LAB — CBC WITH DIFFERENTIAL/PLATELET
BASOS PCT: 1 % (ref 0.0–3.0)
Basophils Absolute: 0 10*3/uL (ref 0.0–0.1)
EOS PCT: 3.6 % (ref 0.0–5.0)
Eosinophils Absolute: 0.1 10*3/uL (ref 0.0–0.7)
HCT: 46.4 % — ABNORMAL HIGH (ref 36.0–46.0)
HEMOGLOBIN: 15.5 g/dL — AB (ref 12.0–15.0)
LYMPHS ABS: 1.5 10*3/uL (ref 0.7–4.0)
Lymphocytes Relative: 36 % (ref 12.0–46.0)
MCHC: 33.5 g/dL (ref 30.0–36.0)
MCV: 93.2 fl (ref 78.0–100.0)
MONO ABS: 0.2 10*3/uL (ref 0.1–1.0)
Monocytes Relative: 4.4 % (ref 3.0–12.0)
Neutro Abs: 2.2 10*3/uL (ref 1.4–7.7)
Neutrophils Relative %: 55 % (ref 43.0–77.0)
Platelets: 148 10*3/uL — ABNORMAL LOW (ref 150.0–400.0)
RBC: 4.98 Mil/uL (ref 3.87–5.11)
RDW: 13.1 % (ref 11.5–15.5)
WBC: 4.1 10*3/uL (ref 4.0–10.5)

## 2017-07-01 LAB — COMPREHENSIVE METABOLIC PANEL
ALT: 16 U/L (ref 0–35)
AST: 20 U/L (ref 0–37)
Albumin: 4.3 g/dL (ref 3.5–5.2)
Alkaline Phosphatase: 71 U/L (ref 39–117)
BUN: 17 mg/dL (ref 6–23)
CHLORIDE: 108 meq/L (ref 96–112)
CO2: 23 meq/L (ref 19–32)
CREATININE: 0.8 mg/dL (ref 0.40–1.20)
Calcium: 9.6 mg/dL (ref 8.4–10.5)
GFR: 74.09 mL/min (ref >60.00)
Glucose, Bld: 91 mg/dL (ref 70–99)
Potassium: 4.1 mEq/L (ref 3.5–5.1)
SODIUM: 139 meq/L (ref 135–145)
Total Bilirubin: 0.4 mg/dL (ref 0.2–1.2)
Total Protein: 6.8 g/dL (ref 6.0–8.3)

## 2017-07-01 LAB — LIPID PANEL
CHOL/HDL RATIO: 4
Cholesterol: 161 mg/dL (ref 0–200)
HDL: 44.5 mg/dL (ref >39.00)
LDL CALC: 79 mg/dL (ref 0–99)
NONHDL: 116.61
Triglycerides: 189 mg/dL — ABNORMAL HIGH (ref 0.0–149.0)
VLDL: 37.8 mg/dL (ref 0.0–40.0)

## 2017-07-01 LAB — TSH: TSH: 5.07 u[IU]/mL — ABNORMAL HIGH (ref 0.35–4.50)

## 2017-07-01 NOTE — Progress Notes (Signed)
I reviewed health advisor's note, was available for consultation, and agree with documentation and plan.  

## 2017-07-01 NOTE — Patient Instructions (Addendum)
Alexandria Moreno , Thank you for taking time to come for your Medicare Wellness Visit. I appreciate your ongoing commitment to your health goals. Please review the following plan we discussed and let me know if I can assist you in the future.   These are the goals we discussed: Goals    . Increase physical activity          Starting 07/01/2017, I will continue to exercise for at least 15-20 min 3 times weekly.       This is a list of the screening recommended for you and due dates:  Health Maintenance  Topic Date Due  . DTaP/Tdap/Td vaccine (1 - Tdap) 05/16/2008  . Tetanus Vaccine  05/15/2018  . Mammogram  06/26/2018  . DEXA scan (bone density measurement)  Completed  . Pneumonia vaccines  Completed   Preventive Care for Adults  A healthy lifestyle and preventive care can promote health and wellness. Preventive health guidelines for adults include the following key practices.  . A routine yearly physical is a good way to check with your health care provider about your health and preventive screening. It is a chance to share any concerns and updates on your health and to receive a thorough exam.  . Visit your dentist for a routine exam and preventive care every 6 months. Brush your teeth twice a day and floss once a day. Good oral hygiene prevents tooth decay and gum disease.  . The frequency of eye exams is based on your age, health, family medical history, use  of contact lenses, and other factors. Follow your health care provider's ecommendations for frequency of eye exams.  . Eat a healthy diet. Foods like vegetables, fruits, whole grains, low-fat dairy products, and lean protein foods contain the nutrients you need without too many calories. Decrease your intake of foods high in solid fats, added sugars, and salt. Eat the right amount of calories for you. Get information about a proper diet from your health care provider, if necessary.  . Regular physical exercise is one of the most  important things you can do for your health. Most adults should get at least 150 minutes of moderate-intensity exercise (any activity that increases your heart rate and causes you to sweat) each week. In addition, most adults need muscle-strengthening exercises on 2 or more days a week.  Silver Sneakers may be a benefit available to you. To determine eligibility, you may visit the website: www.silversneakers.com or contact program at 709-558-1992 Mon-Fri between 8AM-8PM.   . Maintain a healthy weight. The body mass index (BMI) is a screening tool to identify possible weight problems. It provides an estimate of body fat based on height and weight. Your health care provider can find your BMI and can help you achieve or maintain a healthy weight.   For adults 20 years and older: ? A BMI below 18.5 is considered underweight. ? A BMI of 18.5 to 24.9 is normal. ? A BMI of 25 to 29.9 is considered overweight. ? A BMI of 30 and above is considered obese.   . Maintain normal blood lipids and cholesterol levels by exercising and minimizing your intake of saturated fat. Eat a balanced diet with plenty of fruit and vegetables. Blood tests for lipids and cholesterol should begin at age 68 and be repeated every 5 years. If your lipid or cholesterol levels are high, you are over 50, or you are at high risk for heart disease, you may need your cholesterol levels checked  more frequently. Ongoing high lipid and cholesterol levels should be treated with medicines if diet and exercise are not working.  . If you smoke, find out from your health care provider how to quit. If you do not use tobacco, please do not start.  . If you choose to drink alcohol, please do not consume more than 2 drinks per day. One drink is considered to be 12 ounces (355 mL) of beer, 5 ounces (148 mL) of wine, or 1.5 ounces (44 mL) of liquor.  . If you are 12-72 years old, ask your health care provider if you should take aspirin to prevent  strokes.  . Use sunscreen. Apply sunscreen liberally and repeatedly throughout the day. You should seek shade when your shadow is shorter than you. Protect yourself by wearing long sleeves, pants, a wide-brimmed hat, and sunglasses year round, whenever you are outdoors.  . Once a month, do a whole body skin exam, using a mirror to look at the skin on your back. Tell your health care provider of new moles, moles that have irregular borders, moles that are larger than a pencil eraser, or moles that have changed in shape or color.

## 2017-07-02 ENCOUNTER — Other Ambulatory Visit (INDEPENDENT_AMBULATORY_CARE_PROVIDER_SITE_OTHER): Payer: Medicare Other

## 2017-07-02 DIAGNOSIS — R946 Abnormal results of thyroid function studies: Secondary | ICD-10-CM | POA: Diagnosis not present

## 2017-07-02 DIAGNOSIS — G43119 Migraine with aura, intractable, without status migrainosus: Secondary | ICD-10-CM | POA: Diagnosis not present

## 2017-07-02 LAB — T4, FREE: Free T4: 0.73 ng/dL (ref 0.60–1.60)

## 2017-07-06 ENCOUNTER — Ambulatory Visit: Payer: Medicare Other | Admitting: Family Medicine

## 2017-07-13 ENCOUNTER — Encounter: Payer: Self-pay | Admitting: Family Medicine

## 2017-07-13 ENCOUNTER — Ambulatory Visit (INDEPENDENT_AMBULATORY_CARE_PROVIDER_SITE_OTHER): Payer: Medicare Other | Admitting: Family Medicine

## 2017-07-13 VITALS — BP 124/64 | HR 65 | Temp 97.5°F | Ht 63.0 in | Wt 154.5 lb

## 2017-07-13 DIAGNOSIS — E039 Hypothyroidism, unspecified: Secondary | ICD-10-CM | POA: Insufficient documentation

## 2017-07-13 DIAGNOSIS — N393 Stress incontinence (female) (male): Secondary | ICD-10-CM | POA: Diagnosis not present

## 2017-07-13 DIAGNOSIS — M25512 Pain in left shoulder: Secondary | ICD-10-CM | POA: Diagnosis not present

## 2017-07-13 DIAGNOSIS — M7502 Adhesive capsulitis of left shoulder: Secondary | ICD-10-CM | POA: Insufficient documentation

## 2017-07-13 DIAGNOSIS — R490 Dysphonia: Secondary | ICD-10-CM | POA: Diagnosis not present

## 2017-07-13 DIAGNOSIS — E038 Other specified hypothyroidism: Secondary | ICD-10-CM

## 2017-07-13 DIAGNOSIS — E785 Hyperlipidemia, unspecified: Secondary | ICD-10-CM | POA: Diagnosis not present

## 2017-07-13 DIAGNOSIS — N816 Rectocele: Secondary | ICD-10-CM | POA: Diagnosis not present

## 2017-07-13 DIAGNOSIS — Z7189 Other specified counseling: Secondary | ICD-10-CM

## 2017-07-13 MED ORDER — METOPROLOL SUCCINATE ER 25 MG PO TB24: 25.0000 mg | ORAL_TABLET | Freq: Every day | ORAL | 3 refills | Status: DC

## 2017-07-13 MED ORDER — ATORVASTATIN CALCIUM 40 MG PO TABS: 40.0000 mg | ORAL_TABLET | Freq: Every day | ORAL | 3 refills | Status: DC

## 2017-07-13 NOTE — Assessment & Plan Note (Signed)
Denies hypothyroid sxs. I asked her to return in 4 mo for recheck TFTs.

## 2017-07-13 NOTE — Patient Instructions (Addendum)
If interested, check with pharmacy about new 2 shot shingles series (shingrix)  For hoarseness - take one protonix twice daily for next 2 weeks. If no better with this, let me know for ENT referral.  HCPOA forms provided today.  I do think you have rotator cuff injury/tendonitis. Treat with over the counter NSAID (ibuprofen or aleve). Do exercises provided today. If not improving, let us know.  Return in 4 months for labs to recheck thyroid function Return in 1 year for medicare wellness with Virl Axe and follow up visit with me.   Health Maintenance, Female Adopting a healthy lifestyle and getting preventive care can go a long way to promote health and wellness. Talk with your health care provider about what schedule of regular examinations is right for you. This is a good chance for you to check in with your provider about disease prevention and staying healthy. In between checkups, there are plenty of things you can do on your own. Experts have done a lot of research about which lifestyle changes and preventive measures are most likely to keep you healthy. Ask your health care provider for more information. Weight and diet Eat a healthy diet  Be sure to include plenty of vegetables, fruits, low-fat dairy products, and lean protein.  Do not eat a lot of foods high in solid fats, added sugars, or salt.  Get regular exercise. This is one of the most important things you can do for your health. ? Most adults should exercise for at least 150 minutes each week. The exercise should increase your heart rate and make you sweat (moderate-intensity exercise). ? Most adults should also do strengthening exercises at least twice a week. This is in addition to the moderate-intensity exercise.  Maintain a healthy weight  Body mass index (BMI) is a measurement that can be used to identify possible weight problems. It estimates body fat based on height and weight. Your health care provider can help determine  your BMI and help you achieve or maintain a healthy weight.  For females 71 years of age and older: ? A BMI below 18.5 is considered underweight. ? A BMI of 18.5 to 24.9 is normal. ? A BMI of 25 to 29.9 is considered overweight. ? A BMI of 30 and above is considered obese.  Watch levels of cholesterol and blood lipids  You should start having your blood tested for lipids and cholesterol at 76 years of age, then have this test every 5 years.  You may need to have your cholesterol levels checked more often if: ? Your lipid or cholesterol levels are high. ? You are older than 76 years of age. ? You are at high risk for heart disease.  Cancer screening Lung Cancer  Lung cancer screening is recommended for adults 43-16 years old who are at high risk for lung cancer because of a history of smoking.  A yearly low-dose CT scan of the lungs is recommended for people who: ? Currently smoke. ? Have quit within the past 15 years. ? Have at least a 30-pack-year history of smoking. A pack year is smoking an average of one pack of cigarettes a day for 1 year.  Yearly screening should continue until it has been 15 years since you quit.  Yearly screening should stop if you develop a health problem that would prevent you from having lung cancer treatment.  Breast Cancer  Practice breast self-awareness. This means understanding how your breasts normally appear and feel.  It also  means doing regular breast self-exams. Let your health care provider know about any changes, no matter how small.  If you are in your 20s or 30s, you should have a clinical breast exam (CBE) by a health care provider every 1-3 years as part of a regular health exam.  If you are 6 or older, have a CBE every year. Also consider having a breast X-ray (mammogram) every year.  If you have a family history of breast cancer, talk to your health care provider about genetic screening.  If you are at high risk for breast  cancer, talk to your health care provider about having an MRI and a mammogram every year.  Breast cancer gene (BRCA) assessment is recommended for women who have family members with BRCA-related cancers. BRCA-related cancers include: ? Breast. ? Ovarian. ? Tubal. ? Peritoneal cancers.  Results of the assessment will determine the need for genetic counseling and BRCA1 and BRCA2 testing.  Cervical Cancer Your health care provider may recommend that you be screened regularly for cancer of the pelvic organs (ovaries, uterus, and vagina). This screening involves a pelvic examination, including checking for microscopic changes to the surface of your cervix (Pap test). You may be encouraged to have this screening done every 3 years, beginning at age 20.  For women ages 68-65, health care providers may recommend pelvic exams and Pap testing every 3 years, or they may recommend the Pap and pelvic exam, combined with testing for human papilloma virus (HPV), every 5 years. Some types of HPV increase your risk of cervical cancer. Testing for HPV may also be done on women of any age with unclear Pap test results.  Other health care providers may not recommend any screening for nonpregnant women who are considered low risk for pelvic cancer and who do not have symptoms. Ask your health care provider if a screening pelvic exam is right for you.  If you have had past treatment for cervical cancer or a condition that could lead to cancer, you need Pap tests and screening for cancer for at least 20 years after your treatment. If Pap tests have been discontinued, your risk factors (such as having a new sexual partner) need to be reassessed to determine if screening should resume. Some women have medical problems that increase the chance of getting cervical cancer. In these cases, your health care provider may recommend more frequent screening and Pap tests.  Colorectal Cancer  This type of cancer can be detected  and often prevented.  Routine colorectal cancer screening usually begins at 76 years of age and continues through 76 years of age.  Your health care provider may recommend screening at an earlier age if you have risk factors for colon cancer.  Your health care provider may also recommend using home test kits to check for hidden blood in the stool.  A small camera at the end of a tube can be used to examine your colon directly (sigmoidoscopy or colonoscopy). This is done to check for the earliest forms of colorectal cancer.  Routine screening usually begins at age 20.  Direct examination of the colon should be repeated every 5-10 years through 76 years of age. However, you may need to be screened more often if early forms of precancerous polyps or small growths are found.  Skin Cancer  Check your skin from head to toe regularly.  Tell your health care provider about any new moles or changes in moles, especially if there is a change in  a mole's shape or color.  Also tell your health care provider if you have a mole that is larger than the size of a pencil eraser.  Always use sunscreen. Apply sunscreen liberally and repeatedly throughout the day.  Protect yourself by wearing long sleeves, pants, a wide-brimmed hat, and sunglasses whenever you are outside.  Heart disease, diabetes, and high blood pressure  High blood pressure causes heart disease and increases the risk of stroke. High blood pressure is more likely to develop in: ? People who have blood pressure in the high end of the normal range (130-139/85-89 mm Hg). ? People who are overweight or obese. ? People who are African American.  If you are 33-57 years of age, have your blood pressure checked every 3-5 years. If you are 26 years of age or older, have your blood pressure checked every year. You should have your blood pressure measured twice-once when you are at a hospital or clinic, and once when you are not at a hospital or  clinic. Record the average of the two measurements. To check your blood pressure when you are not at a hospital or clinic, you can use: ? An automated blood pressure machine at a pharmacy. ? A home blood pressure monitor.  If you are between 58 years and 39 years old, ask your health care provider if you should take aspirin to prevent strokes.  Have regular diabetes screenings. This involves taking a blood sample to check your fasting blood sugar level. ? If you are at a normal weight and have a low risk for diabetes, have this test once every three years after 76 years of age. ? If you are overweight and have a high risk for diabetes, consider being tested at a younger age or more often. Preventing infection Hepatitis B  If you have a higher risk for hepatitis B, you should be screened for this virus. You are considered at high risk for hepatitis B if: ? You were born in a country where hepatitis B is common. Ask your health care provider which countries are considered high risk. ? Your parents were born in a high-risk country, and you have not been immunized against hepatitis B (hepatitis B vaccine). ? You have HIV or AIDS. ? You use needles to inject street drugs. ? You live with someone who has hepatitis B. ? You have had sex with someone who has hepatitis B. ? You get hemodialysis treatment. ? You take certain medicines for conditions, including cancer, organ transplantation, and autoimmune conditions.  Hepatitis C  Blood testing is recommended for: ? Everyone born from [birth year] through 1965. ? Anyone with known risk factors for hepatitis C.  Sexually transmitted infections (STIs)  You should be screened for sexually transmitted infections (STIs) including gonorrhea and chlamydia if: ? You are sexually active and are younger than 76 years of age. ? You are older than 76 years of age and your health care provider tells you that you are at risk for this type of infection. ? Your  sexual activity has changed since you were last screened and you are at an increased risk for chlamydia or gonorrhea. Ask your health care provider if you are at risk.  If you do not have HIV, but are at risk, it may be recommended that you take a prescription medicine daily to prevent HIV infection. This is called pre-exposure prophylaxis (PrEP). You are considered at risk if: ? You are sexually active and do not regularly use condoms  or know the HIV status of your partner(s). ? You take drugs by injection. ? You are sexually active with a partner who has HIV.  Talk with your health care provider about whether you are at high risk of being infected with HIV. If you choose to begin PrEP, you should first be tested for HIV. You should then be tested every 3 months for as long as you are taking PrEP. Pregnancy  If you are premenopausal and you may become pregnant, ask your health care provider about preconception counseling.  If you may become pregnant, take 400 to 800 micrograms (mcg) of folic acid every day.  If you want to prevent pregnancy, talk to your health care provider about birth control (contraception). Osteoporosis and menopause  Osteoporosis is a disease in which the bones lose minerals and strength with aging. This can result in serious bone fractures. Your risk for osteoporosis can be identified using a bone density scan.  If you are 1 years of age or older, or if you are at risk for osteoporosis and fractures, ask your health care provider if you should be screened.  Ask your health care provider whether you should take a calcium or vitamin D supplement to lower your risk for osteoporosis.  Menopause may have certain physical symptoms and risks.  Hormone replacement therapy may reduce some of these symptoms and risks. Talk to your health care provider about whether hormone replacement therapy is right for you. Follow these instructions at home:  Schedule regular health,  dental, and eye exams.  Stay current with your immunizations.  Do not use any tobacco products including cigarettes, chewing tobacco, or electronic cigarettes.  If you are pregnant, do not drink alcohol.  If you are breastfeeding, limit how much and how often you drink alcohol.  Limit alcohol intake to no more than 1 drink per day for nonpregnant women. One drink equals 12 ounces of beer, 5 ounces of wine, or 1 ounces of hard liquor.  Do not use street drugs.  Do not share needles.  Ask your health care provider for help if you need support or information about quitting drugs.  Tell your health care provider if you often feel depressed.  Tell your health care provider if you have ever been abused or do not feel safe at home. This information is not intended to replace advice given to you by your health care provider. Make sure you discuss any questions you have with your health care provider. Document Released: 06/16/2011 Document Revised: 05/08/2016 Document Reviewed: 09/04/2015 Elsevier Interactive Patient Education  [employer].

## 2017-07-13 NOTE — Progress Notes (Signed)
BP 124/64   Pulse 65   Temp (!) 97.5 F (36.4 C) (Oral)   Ht 5\' 3"  (1.6 m)   Wt 154 lb 8 oz (70.1 kg)   SpO2 98%   BMI 27.37 kg/m    CC: medicare wellness visit Subjective:    Patient ID: Alexandria Moreno, female    DOB: [birth year], 76 y.o.   MRN: [id number]  HPI: Alexandria Moreno is a 76 y.o. female presenting on 07/13/2017 for Annual Exam (Medicare pt 2); Shoulder Pain (L); and Hoarse   Saw Hoyle Sauer last week for medicare wellness visit. Note reviewed.   Recently started on detrol to build bladder strength in h/o stress exercises - no significant improvement. She does have mesh and sling in place.   Subclinical hypothyroidism - denies significant hypothyroidism. No significant hypothyroid symptoms. She does notice some hair loss as well as constipation (daily stool softener).   Ongoing hoarseness - present for months. Denies gerd or significant allergic rhinitis. She does take protonix 40mg  daily. Significant h/o tobacco abuse.   Ongoing L shoulder pain worse with reaching behind L back.   Dr Manuella Ghazi rec against antihistamines/benadryl and steroids while on topiramate.   Preventative: Last well woman Dr Teryl [facility] saw 08/2016. Known h/o rectocele  COLONOSCOPY/capsule endoscopy Date: 2009 diverticulosis ([place])  Mammogram WNL 06/2017 DEXA Date: 06/2016 T -0.2 hip, 0.7 spine Flu ALLERGY prevnar 2015, pneumovax 2009 Td 2009 zostavax 2014 shingrix - discussed Advanced directives - scanned 07/2016. No life prolonging measures if terminal irreversible condition. No HCPOA named - HCPOA form provided today.  Seat belt use discussed.  Sunscreen use discussed. No changing moles on skin. Ex smoker - quit 1980. Alcohol - rare  Lives with husband  Occupation: retired, was Art therapist Edu: some college Activity: stationary bicycle  Diet: good water, following slim fast diet, good fruits/vegetables   Relevant past medical, surgical, family and social history  reviewed and updated as indicated. Interim medical history since our last visit reviewed. Allergies and medications reviewed and updated. Outpatient Medications Prior to Visit  Medication Sig Dispense Refill  . B COMPLEX VITAMINS PO Take by mouth daily.    . Calcium Carb-Cholecalciferol (CALCIUM-VITAMIN D3) 600-400 MG-UNIT TABS Take 1 tablet by mouth daily.    . cholecalciferol (VITAMIN D) 1000 units tablet Take 1,000 Units by mouth daily.    Marland Kitchen conjugated estrogens (PREMARIN) vaginal cream Place 1 Applicatorful vaginally 2 (two) times a week.    . cycloSPORINE (RESTASIS) 0.05 % ophthalmic emulsion Apply 0.05 drops to eye 2 (two) times daily.    Marland Kitchen docusate sodium (COLACE) 100 MG capsule Take 1 capsule by mouth daily.    Marland Kitchen glucosamine-chondroitin 500-400 MG tablet Take 1 tablet by mouth 2 (two) times daily.    . Methylcellulose, Laxative, (FIBER THERAPY PO) Take 3 tablets by mouth daily.    . MULTIPLE VITAMIN PO Take by mouth daily.    . OMEGA-3 FATTY ACIDS PO Take 1,200 mg by mouth daily.     . pantoprazole (PROTONIX) 40 MG tablet TAKE 1 TABLET DAILY 90 tablet 3  . rizatriptan (MAXALT) 10 MG tablet Take 10 mg by mouth daily as needed.    . tolterodine (DETROL LA) 2 MG 24 hr capsule Take 2 mg by mouth daily.    Marland Kitchen topiramate (TOPAMAX) 100 MG tablet Take 100 mg by mouth 2 (two) times daily.    Marland Kitchen atorvastatin (LIPITOR) 40 MG tablet Take 1 tablet (40 mg total) by mouth daily. OFFICE VISIT  WITH LABS REQUIRED FOR ADDITIONAL REFILLS 30 tablet 0  . TOPROL XL 25 MG 24 hr tablet TAKE 1 TABLET DAILY 90 tablet 1   No facility-administered medications prior to visit.      Per HPI unless specifically indicated in ROS section below Review of Systems     Objective:    BP 124/64   Pulse 65   Temp (!) 97.5 F (36.4 C) (Oral)   Ht 5\' 3"  (1.6 m)   Wt 154 lb 8 oz (70.1 kg)   SpO2 98%   BMI 27.37 kg/m   Wt Readings from Last 3 Encounters:  07/13/17 154 lb 8 oz (70.1 kg)  07/01/17 153 lb 8 oz (69.6  kg)  10/24/16 153 lb 4 oz (69.5 kg)    Physical Exam  Constitutional: She is oriented to person, place, and time. She appears well-developed and well-nourished. No distress.  HENT:  Head: Normocephalic and atraumatic.  Right Ear: Hearing, tympanic membrane, external ear and ear canal normal.  Left Ear: Hearing, tympanic membrane, external ear and ear canal normal.  Nose: Nose normal.  Mouth/Throat: Uvula is midline, oropharynx is clear and moist and mucous membranes are normal. No oropharyngeal exudate, posterior oropharyngeal edema or posterior oropharyngeal erythema.  Eyes: Pupils are equal, round, and reactive to light. Conjunctivae and EOM are normal. No scleral icterus.  Neck: Normal range of motion. Neck supple.  Cardiovascular: Normal rate, regular rhythm, normal heart sounds and intact distal pulses.   No murmur heard. Pulses:      Radial pulses are 2+ on the right side, and 2+ on the left side.  Pulmonary/Chest: Effort normal and breath sounds normal. No respiratory distress. She has no wheezes. She has no rales.  Abdominal: Soft. Bowel sounds are normal. She exhibits no distension and no mass. There is no tenderness. There is no rebound and no guarding.  Musculoskeletal: Normal range of motion. She exhibits no edema.  Lymphadenopathy:    She has no cervical adenopathy.  Neurological: She is alert and oriented to person, place, and time.  CN grossly intact, station and gait intact  Skin: Skin is warm and dry. No rash noted.  Psychiatric: She has a normal mood and affect. Her behavior is normal. Judgment and thought content normal.  Nursing note and vitals reviewed.  Results for orders placed or performed in visit on 07/02/17  T4, free  Result Value Ref Range   Free T4 0.73 0.60 - 1.60 ng/dL   Lab Results  Component Value Date   CHOL 161 07/01/2017   HDL 44.50 07/01/2017   LDLCALC 79 07/01/2017   TRIG 189.0 (H) 07/01/2017   CHOLHDL 4 07/01/2017       Assessment &  Plan:   Problem List Items Addressed This Visit    Advanced care planning/counseling discussion    Advanced directives - scanned 07/2016. No life prolonging measures if terminal irreversible condition. No HCPOA named - HCPOA form provided today.       Dyslipidemia - Primary    Chronic, stable on atorvastatin. Continue.  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      Hoarseness    Not appreciated today but pt endorses ongoing for months. In h/o GERD, possible contribution. Will increase protonix to 40mg  bid x 2 wks. If not improved with this, would consider ENT referral. Pt agrees with plan.       Left shoulder pain    Anticipate RTC injury - discussed with patient. rec exercises provided today. rec OTC NSAIDs. Pt agrees with plan.       Stress incontinence, female    Followed by GYN - on detrol LA daily.       Subclinical hypothyroidism    Denies hypothyroid sxs. I asked her to return in 4 mo for recheck TFTs.       Relevant Medications   metoprolol succinate (TOPROL XL) 25 MG 24 hr tablet       Follow up plan: Return in about 1 year (around 07/13/2018) for medicare wellness visit.  Ria Bush, MD

## 2017-07-13 NOTE — Assessment & Plan Note (Signed)
Followed by GYN

## 2017-07-13 NOTE — Assessment & Plan Note (Signed)
Chronic, stable on atorvastatin. Continue.  The 10-year ASCVD risk score Mikey Bussing DC Brooke Bonito., et al., 2013) is: 16.5%   Values used to calculate the score:     Age: 76 years     Sex: Female     Is Non-Hispanic African American: No     Diabetic: No     Tobacco smoker: No     Systolic Blood Pressure: 045 mmHg     Is BP treated: No     HDL Cholesterol: 44.5 mg/dL     Total Cholesterol: 161 mg/dL

## 2017-07-13 NOTE — Assessment & Plan Note (Signed)
Not appreciated today but pt endorses ongoing for months. In h/o GERD, possible contribution. Will increase protonix to 40mg  bid x 2 wks. If not improved with this, would consider ENT referral. Pt agrees with plan.

## 2017-07-13 NOTE — Assessment & Plan Note (Addendum)
Advanced directives - scanned 07/2016. No life prolonging measures if terminal irreversible condition. No HCPOA named - HCPOA form provided today.

## 2017-07-13 NOTE — Assessment & Plan Note (Addendum)
Anticipate RTC injury - discussed with patient. rec exercises provided today. rec OTC NSAIDs. Pt agrees with plan.

## 2017-07-13 NOTE — Assessment & Plan Note (Signed)
Followed by GYN - on detrol LA daily.

## 2017-07-21 DIAGNOSIS — Z1283 Encounter for screening for malignant neoplasm of skin: Secondary | ICD-10-CM | POA: Diagnosis not present

## 2017-07-21 DIAGNOSIS — L821 Other seborrheic keratosis: Secondary | ICD-10-CM | POA: Diagnosis not present

## 2017-07-21 DIAGNOSIS — D1800 Hemangioma unspecified site: Secondary | ICD-10-CM | POA: Diagnosis not present

## 2017-07-21 DIAGNOSIS — D485 Neoplasm of uncertain behavior of skin: Secondary | ICD-10-CM | POA: Diagnosis not present

## 2017-07-21 DIAGNOSIS — L820 Inflamed seborrheic keratosis: Secondary | ICD-10-CM | POA: Diagnosis not present

## 2017-07-21 DIAGNOSIS — Z85828 Personal history of other malignant neoplasm of skin: Secondary | ICD-10-CM | POA: Diagnosis not present

## 2017-07-21 DIAGNOSIS — L578 Other skin changes due to chronic exposure to nonionizing radiation: Secondary | ICD-10-CM | POA: Diagnosis not present

## 2017-07-21 DIAGNOSIS — C4441 Basal cell carcinoma of skin of scalp and neck: Secondary | ICD-10-CM | POA: Diagnosis not present

## 2017-07-21 DIAGNOSIS — C44619 Basal cell carcinoma of skin of left upper limb, including shoulder: Secondary | ICD-10-CM | POA: Diagnosis not present

## 2017-07-21 DIAGNOSIS — L718 Other rosacea: Secondary | ICD-10-CM | POA: Diagnosis not present

## 2017-08-18 DIAGNOSIS — D3131 Benign neoplasm of right choroid: Secondary | ICD-10-CM | POA: Diagnosis not present

## 2017-08-18 DIAGNOSIS — H43813 Vitreous degeneration, bilateral: Secondary | ICD-10-CM | POA: Diagnosis not present

## 2017-08-18 DIAGNOSIS — H524 Presbyopia: Secondary | ICD-10-CM | POA: Diagnosis not present

## 2017-08-18 DIAGNOSIS — H1852 Epithelial (juvenile) corneal dystrophy: Secondary | ICD-10-CM | POA: Diagnosis not present

## 2017-09-07 DIAGNOSIS — Z85828 Personal history of other malignant neoplasm of skin: Secondary | ICD-10-CM | POA: Diagnosis not present

## 2017-09-07 DIAGNOSIS — C44619 Basal cell carcinoma of skin of left upper limb, including shoulder: Secondary | ICD-10-CM | POA: Diagnosis not present

## 2017-09-07 DIAGNOSIS — C4441 Basal cell carcinoma of skin of scalp and neck: Secondary | ICD-10-CM | POA: Diagnosis not present

## 2017-10-15 ENCOUNTER — Other Ambulatory Visit: Payer: Self-pay | Admitting: Family Medicine

## 2017-10-15 ENCOUNTER — Ambulatory Visit (INDEPENDENT_AMBULATORY_CARE_PROVIDER_SITE_OTHER): Payer: Medicare Other | Admitting: Family Medicine

## 2017-10-15 ENCOUNTER — Encounter: Payer: Self-pay | Admitting: Family Medicine

## 2017-10-15 VITALS — BP 122/64 | HR 60 | Temp 97.9°F | Wt 153.0 lb

## 2017-10-15 DIAGNOSIS — G8929 Other chronic pain: Secondary | ICD-10-CM | POA: Diagnosis not present

## 2017-10-15 DIAGNOSIS — E039 Hypothyroidism, unspecified: Secondary | ICD-10-CM

## 2017-10-15 DIAGNOSIS — E038 Other specified hypothyroidism: Secondary | ICD-10-CM

## 2017-10-15 DIAGNOSIS — D751 Secondary polycythemia: Secondary | ICD-10-CM | POA: Diagnosis not present

## 2017-10-15 DIAGNOSIS — M25512 Pain in left shoulder: Secondary | ICD-10-CM | POA: Diagnosis not present

## 2017-10-15 LAB — CBC WITH DIFFERENTIAL/PLATELET
BASOS ABS: 0 10*3/uL (ref 0.0–0.1)
Basophils Relative: 0.8 % (ref 0.0–3.0)
EOS ABS: 0.1 10*3/uL (ref 0.0–0.7)
Eosinophils Relative: 2.5 % (ref 0.0–5.0)
HCT: 48.4 % — ABNORMAL HIGH (ref 36.0–46.0)
Hemoglobin: 16.2 g/dL — ABNORMAL HIGH (ref 12.0–15.0)
LYMPHS ABS: 1.7 10*3/uL (ref 0.7–4.0)
Lymphocytes Relative: 32.4 % (ref 12.0–46.0)
MCHC: 33.4 g/dL (ref 30.0–36.0)
MCV: 94.9 fl (ref 78.0–100.0)
MONO ABS: 0.2 10*3/uL (ref 0.1–1.0)
Monocytes Relative: 4 % (ref 3.0–12.0)
NEUTROS ABS: 3.2 10*3/uL (ref 1.4–7.7)
NEUTROS PCT: 60.3 % (ref 43.0–77.0)
PLATELETS: 161 10*3/uL (ref 150.0–400.0)
RBC: 5.1 Mil/uL (ref 3.87–5.11)
RDW: 13.1 % (ref 11.5–15.5)
WBC: 5.3 10*3/uL (ref 4.0–10.5)

## 2017-10-15 LAB — TSH: TSH: 3.73 u[IU]/mL (ref 0.35–4.50)

## 2017-10-15 LAB — T4, FREE: Free T4: 0.74 ng/dL (ref 0.60–1.60)

## 2017-10-15 MED ORDER — NAPROXEN 375 MG PO TABS: 375.0000 mg | ORAL_TABLET | Freq: Two times a day (BID) | ORAL | 0 refills | Status: DC

## 2017-10-15 NOTE — Assessment & Plan Note (Signed)
Mild. Update CBC.

## 2017-10-15 NOTE — Assessment & Plan Note (Signed)
Update TFTs ° °

## 2017-10-15 NOTE — Patient Instructions (Addendum)
Labs today I think you have rotator cuff issue - treat with naprosyn 375mg  twice daily with meals for 5 days then as needed. Ice or heating pad to the shoulder.  Schedule appointment with Dr Lorelei Pont for further evaluation of left shoulder.

## 2017-10-15 NOTE — Progress Notes (Signed)
BP 122/64 (BP Location: Left Arm, Patient Position: Sitting, Cuff Size: Normal)   Pulse 60   Temp 97.9 F (36.6 C) (Oral)   Wt 153 lb (69.4 kg)   SpO2 100%   BMI 27.10 kg/m    CC: f/u shoulder pain  Subjective:    Patient ID: Matty Deamer, female    DOB: 1941-06-08, 76 y.o.   MRN: [id number]  HPI: Yatziri Wainwright is a 76 y.o. female presenting on 10/15/2017 for Shoulder Pain (follow-up. Has used exercises recommended by Dr. Danise Mina, no change in pain in ROM) and Neck Pain (on left side. Pain was sharp for 5-10 mins. Occurred last week after cleaning house. One time occurence. Concerned due to valve issue)   Ongoing L shoulder pain since June, thought RTC injury rec treatment with home exercise program and NSAIDs. Significant pain with reaching backwards, raising arm, hooking bra. She has been doing exercises provided with limited improvement. She is also using sparing aleve which helps.   She realized that pain started after reaching to back seat of car to lift set of water bottles.   She had episode last week of sharp neck pain at left neck that traveled up to occiput. This lasted 10-15 min and then resolved.   Relevant past medical, surgical, family and social history reviewed and updated as indicated. Interim medical history since our last visit reviewed. Allergies and medications reviewed and updated. Outpatient Medications Prior to Visit  Medication Sig Dispense Refill  . atorvastatin (LIPITOR) 40 MG tablet Take 1 tablet (40 mg total) by mouth daily. 90 tablet 3  . B COMPLEX VITAMINS PO Take by mouth daily.    . Calcium Carb-Cholecalciferol (CALCIUM-VITAMIN D3) 600-400 MG-UNIT TABS Take 1 tablet by mouth daily.    . cholecalciferol (VITAMIN D) 1000 units tablet Take 1,000 Units by mouth daily.    . cycloSPORINE (RESTASIS) 0.05 % ophthalmic emulsion Apply 0.05 drops to eye 2 (two) times daily.    Marland Kitchen docusate sodium (COLACE) 100 MG capsule Take 1 capsule by mouth daily.    Marland Kitchen  glucosamine-chondroitin 500-400 MG tablet Take 1 tablet by mouth 2 (two) times daily.    . Methylcellulose, Laxative, (FIBER THERAPY PO) Take 3 tablets by mouth daily.    . metoprolol succinate (TOPROL XL) 25 MG 24 hr tablet Take 1 tablet (25 mg total) by mouth daily. 90 tablet 3  . MULTIPLE VITAMIN PO Take by mouth daily.    . OMEGA-3 FATTY ACIDS PO Take 1,200 mg by mouth daily.     . pantoprazole (PROTONIX) 40 MG tablet TAKE 1 TABLET DAILY 90 tablet 3  . rizatriptan (MAXALT) 10 MG tablet Take 10 mg by mouth daily as needed.    . topiramate (TOPAMAX) 100 MG tablet Take 100 mg by mouth 2 (two) times daily.    Marland Kitchen conjugated estrogens (PREMARIN) vaginal cream Place 1 Applicatorful vaginally 2 (two) times a week.    . tolterodine (DETROL LA) 2 MG 24 hr capsule Take 2 mg by mouth daily.     No facility-administered medications prior to visit.      Per HPI unless specifically indicated in ROS section below Review of Systems     Objective:    BP 122/64 (BP Location: Left Arm, Patient Position: Sitting, Cuff Size: Normal)   Pulse 60   Temp 97.9 F (36.6 C) (Oral)   Wt 153 lb (69.4 kg)   SpO2 100%   BMI 27.10 kg/m   Wt Readings from Last 3  Encounters:  10/15/17 153 lb (69.4 kg)  07/13/17 154 lb 8 oz (70.1 kg)  07/01/17 153 lb 8 oz (69.6 kg)    Physical Exam  Constitutional: She is oriented to person, place, and time. She appears well-developed and well-nourished. No distress.  HENT:  Mouth/Throat: Oropharynx is clear and moist. No oropharyngeal exudate.  Neck: Normal range of motion. Neck supple. Carotid bruit is not present.  Cardiovascular: Normal rate, regular rhythm, normal heart sounds and intact distal pulses.   No murmur heard. Pulmonary/Chest: Effort normal and breath sounds normal. No respiratory distress. She has no wheezes. She has no rales.  Musculoskeletal: She exhibits no edema.  No midline spine or paraspinous mm tenderness R shoulder WNL L shoulder exam: No  deformity of shoulders on inspection. Tender to palpation anterior shoulder at [facility] joint and subdeltoid bursa.  Limited ROM in abduction past 90 degrees, both active and passive. Better ROM with forward flexion testing. Neg drop arm sign + pain with testing SITS in int rotation. + pain with empty can sign. Neg Yerguson, Speed test. No impingement. No pain with rotation of humeral head in [facility] joint.   Lymphadenopathy:    She has no cervical adenopathy.  Neurological: She is alert and oriented to person, place, and time.  Skin: Skin is warm and dry. No rash noted.  Nursing note and vitals reviewed.  Lab Results  Component Value Date   CREATININE 0.80 07/01/2017       Assessment & Plan:   Problem List Items Addressed This Visit    Left shoulder pain - Primary    Ongoing over >3 months now. Anticipate ongoing rotator cuff injury - tendonitis vs partial tear with ongoing pain, with bursitis and possibly developing adhesive capsulitis. rec ongoing exercises, add naprosyn 375mg  BID x 5-7 days with meals then PRN (caution in h/o GIB). Steroid injection may be helpful - I asked her to return to see Dr Lorelei Pont our sports medicine doctor for further eval. Pt agrees with plan.       Polycythemia    Mild. Update CBC.       Relevant Orders   CBC with Differential/Platelet   Subclinical hypothyroidism    Update TFTs.       Relevant Orders   TSH   T4, free       Follow up plan: Return if symptoms worsen or fail to improve.  Ria Bush, MD

## 2017-10-15 NOTE — Assessment & Plan Note (Signed)
Ongoing over >3 months now. Anticipate ongoing rotator cuff injury - tendonitis vs partial tear with ongoing pain, with bursitis and possibly developing adhesive capsulitis. rec ongoing exercises, add naprosyn 375mg  BID x 5-7 days with meals then PRN (caution in h/o GIB). Steroid injection may be helpful - I asked her to return to see Dr Lorelei Pont our sports medicine doctor for further eval. Pt agrees with plan.

## 2017-10-21 ENCOUNTER — Encounter: Payer: Self-pay | Admitting: Family Medicine

## 2017-10-21 ENCOUNTER — Ambulatory Visit (INDEPENDENT_AMBULATORY_CARE_PROVIDER_SITE_OTHER): Payer: Medicare Other | Admitting: Family Medicine

## 2017-10-21 VITALS — BP 110/70 | HR 66 | Temp 97.8°F | Ht 63.0 in | Wt 156.8 lb

## 2017-10-21 DIAGNOSIS — M7502 Adhesive capsulitis of left shoulder: Secondary | ICD-10-CM

## 2017-10-21 MED ORDER — METHYLPREDNISOLONE ACETATE 40 MG/ML IJ SUSP
80.0000 mg | Freq: Once | INTRAMUSCULAR | Status: AC
  Administered 2017-10-21: 80 mg via INTRA_ARTICULAR

## 2017-10-21 NOTE — Progress Notes (Signed)
Dr. Frederico Hamman T. Genesi Stefanko, MD, [facility] [place], [address] Phone: [phone] Fax: [phone]  10/21/2017  Patient: Alexandria Moreno, MRN: [id number], DOB: [birth year], 76 y.o.  Primary Physician:  Ria Bush, MD   Chief Complaint  Patient presents with  . Shoulder Pain    Left since June   Subjective:   Alexandria Moreno is a 75 y.o. very pleasant female patient who presents with the following:  L shoulder pain since June 2018. Reached back and lifted up and over the seat. Started getting sore then. Hurts much of the time. At this point, the primary issue is a dull ache and pain throughout the shoulder region that radiates down her arm somewhat. She also has a distinct restriction in motion in all planes of movement. Her strength is preserved in the lower parts of her shoulder motion. She has not had any prior significant injuries, fractures, or surgery.  She is right-hand dominant.  Past Medical History, Surgical History, Social History, Family History, Problem List, Medications, and Allergies have been reviewed and updated if relevant.  Patient Active Problem List   Diagnosis Date Noted  . Polycythemia 10/15/2017  . Hoarseness 07/13/2017  . Left shoulder pain 07/13/2017  . 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 Post-viral cough syndrome 10/24/2016  . Advanced care planning/counseling discussion 06/30/2016  . Chronic left SI joint pain 01/08/2016  . Palpitation 11/19/2015  . Post-nasal drainage 11/19/2015  . Chronic migraine 05/18/2015  . Allergic rhinitis 03/19/2015  . 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 Atypical migraine 03/19/2015  . Dyslipidemia 03/19/2015  . GERD (gastroesophageal reflux disease) 03/19/2015  . H/O: rheumatic fever 03/19/2015  . H/O adenomatous polyp of colon 03/19/2015  . H/O surgical procedure 03/19/2015  . 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 Acne erythematosa  03/19/2015  . 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 Stress incontinence, female 03/19/2015    Past Medical History:  Diagnosis Date  . Bilateral carotid bruits 2008   per GI note  . Cancer ([place])    skin  . Diverticulosis   . DJD (degenerative joint disease) of knee   . Frequent headaches   . GERD (gastroesophageal reflux disease)   . H/O transfusion of whole blood 2009  . H/O: GI bleed 2009   due to ASA  . H/O: rheumatic fever    76 years old  . Heart valve problem   . History of chicken pox   . Hyperlipidemia   . Migraines   . Palpitation    controlled with Toprol  . Pelvic prolapse    with rectocele  . Rosacea   . Seasonal allergic rhinitis    mild  . Stress incontinence     Past Surgical History:  Procedure Laterality Date  . ABDOMINAL HYSTERECTOMY  1981  . APPENDECTOMY  1960  . capsule endoscopy  2009   WNL  . CATARACT EXTRACTION Bilateral [id number]  . COLONOSCOPY  2009   diverticulosis ([place])  . DEXA  06/2016   T -0.2 hip, 0.7 spine  . ESOPHAGOGASTRODUODENOSCOPY  2009   focal mild chronic gastritis, neg H pylori ([place])  . INCONTINENCE SURGERY  2001   with mesh  . LIPOMA EXCISION  1971   right shoulder  . [place]  . OVARIAN CYST REMOVAL  1960  . SALPINGOOPHORECTOMY Left 2001  . TONSILLECTOMY  1958    Social History   Socioeconomic History  .  Marital status: Married    Spouse name: Not on file  . Number of children: Not on file  . Years of education: Not on file  . Highest education level: Not on file  Social Needs  . Financial resource strain: Not on file  . Food insecurity - worry: Not on file  . Food insecurity - inability: Not on file  . Transportation needs - medical: Not on file  . Transportation needs - non-medical: Not on file  Occupational History  . Not on file  Tobacco Use  . Smoking status: Former Smoker    Last attempt to quit: 12/15/1978    Years since quitting: 38.8  . Smokeless tobacco:  Never Used  Substance and Sexual Activity  . Alcohol use: No    Alcohol/week: 0.0 oz    Comment: Rare  . Drug use: No  . Sexual activity: No  Other Topics Concern  . Not on file  Social History Narrative   Lives with husband    Occupation: retired, was Art therapist   Edu: some college   Activity: stationary bicycle   Diet: good water, following slim fast diet, good vegetables    Family History  Problem Relation Age of Onset  . Stroke Mother 55  . Cancer Mother        Cervical  . Depression Mother   . Dementia Mother        vascular  . Heart disease Father        CABG  . Alcohol abuse Father   . Osteoporosis Father   . Hypertension Brother   . Stroke Brother 35  . Stroke Paternal Grandfather   . Alzheimer's disease Maternal Grandfather   . Breast cancer Neg Hx     Allergies  Allergen Reactions  . Aspirin Other (See Comments)    GI bleed  . Baclofen Diarrhea  . Codeine Nausea And Vomiting  . Imitrex  [Sumatriptan]     Palpatations  . Influenza Vaccines Swelling  . Vicodin  [Hydrocodone-Acetaminophen] Nausea And Vomiting    Medication list reviewed and updated in full in [place].  GEN: No fevers, chills. Nontoxic. Primarily MSK c/o today. MSK: Detailed in the HPI GI: tolerating PO intake without difficulty Neuro: No numbness, parasthesias, or tingling associated. Otherwise the pertinent positives of the ROS are noted above.   Objective:   BP 110/70   Pulse 66   Temp 97.8 F (36.6 C) (Oral)   Ht 5\' 3"  (1.6 m)   Wt 156 lb 12 oz (71.1 kg)   BMI 27.77 kg/m    GEN: WDWN, NAD, Non-toxic, Alert & Oriented x 3 HEENT: Atraumatic, Normocephalic.  Ears and Nose: No external deformity. EXTR: No clubbing/cyanosis/edema NEURO: Normal gait.  PSYCH: Normally interactive. Conversant. Not depressed or anxious appearing.  Calm demeanor.   Shoulder: R and L Inspection: No muscle wasting or winging Ecchymosis/edema: neg  AC joint, scapula, clavicle:  NT Cervical spine: NT, full ROM Spurling's: neg ABNORMAL SIDE TESTED: L UNLESS OTHERWISE NOTED, THE CONTRALATERAL SIDE HAS FULL RANGE OF MOTION. Abduction: 5/5, LIMITED TO 90 DEGREES Flexion: 5/5, LIMITED TO 125 DEGNO ROM  IR, lift-off: 5/5. TESTED AT 90 DEGREES OF ABDUCTION, LIMITED TO 0 DEGREES ER at neutral:  5/5, TESTED AT 90 DEGREES OF ABDUCTION, LIMITED TO 65 DEGREES AC crossover and compression: PAIN Drop Test: neg Empty Can: neg Supraspinatus insertion: NT Bicipital groove: NT ALL OTHER SPECIAL TESTING EQUIVOCAL GIVEN LOSS OF MOTION C5-T1 intact Sensation intact Grip 5/5  Radiology: No results found.   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 Emphasized adherence and recommended formal PT.  Tylenol for pain relief  Patient will be sent for formal PT for aggressive frozen shoulder ROM. Will need RTC str and scapular stabilization to fix underlying mechanics.  Intraarticular corticosteroid injections in Adhesive Capsulitis have clearly been shown to be of benefit.   Intrarticular Shoulder Injection, L Verbal consent was obtained from the patient. Risks including infection explained and contrasted with benefits and alternatives. Patient prepped with Chloraprep and Ethyl Chloride used for anesthesia. An intraarticular shoulder injection was performed using the posterior approach. The patient tolerated the procedure well and had decreased pain post injection. No complications. Injection: 8 cc of Lidocaine 1% and 2 mL Depo-Medrol 40 mg. Needle: 21 gauge, 2 in  Follow-up: Return in about 2 months (around 12/21/2017).  Future Appointments  Date Time Provider [place]  10/27/2017  1:30 PM Lucilla Lame, MD [facility] None   12/21/2017  2:00 PM Owens Loffler, MD [facility]  07/07/2018  9:45 AM Eustace Pen, LPN [facility]  08/17/4267  2:30 PM Ria Bush, MD [facility]    Meds ordered this encounter  Medications  . methylPREDNISolone acetate (DEPO-MEDROL) injection 80 mg   Medications Discontinued During This Encounter  Medication Reason  . naproxen (NAPROSYN) 375 MG tablet    Orders Placed This Encounter  Procedures  . Ambulatory referral to Physical Therapy    Signed,  Frederico Hamman T. Elisa Sorlie, MD   Allergies as of 10/21/2017      Reactions   Aspirin Other (See Comments)   GI bleed   Baclofen Diarrhea   Codeine Nausea And Vomiting   Imitrex  [sumatriptan]    Palpatations   Influenza Vaccines Swelling   Vicodin  [hydrocodone-acetaminophen] Nausea And Vomiting      Medication List        Accurate as of 10/21/17 11:59 PM. Always use your most recent med list.          atorvastatin 40 MG tablet Commonly known as:  LIPITOR Take 1 tablet (40 mg total) by mouth daily.   B COMPLEX VITAMINS PO Take by mouth daily.   Calcium-Vitamin D3 600-400 MG-UNIT Tabs Take 1 tablet by mouth daily.   cholecalciferol 1000 units tablet Commonly known as:  VITAMIN D Take 1,000 Units by mouth daily.   cycloSPORINE 0.05 % ophthalmic emulsion Commonly known as:  RESTASIS Apply 0.05 drops to eye 2 (two) times daily.   docusate sodium 100 MG capsule Commonly known as:  COLACE Take 1 capsule by mouth daily.   FIBER THERAPY PO Take 3 tablets by mouth daily.   glucosamine-chondroitin 500-400 MG tablet Take 1 tablet by mouth 2 (two) times daily.   metoprolol succinate 25 MG 24 hr tablet Commonly known as:  TOPROL XL Take 1 tablet (25 mg total) by mouth daily.   MULTIPLE VITAMIN PO Take by mouth daily.   OMEGA-3 FATTY ACIDS PO Take 1,200 mg by mouth daily.   pantoprazole 40 MG tablet Commonly known as:  PROTONIX TAKE 1 TABLET DAILY   rizatriptan 10 MG tablet Commonly known  as:  MAXALT Take 10 mg by mouth daily as needed.   topiramate 100 MG tablet Commonly known as:  TOPAMAX Take 100  mg by mouth 2 (two) times daily.

## 2017-10-27 ENCOUNTER — Ambulatory Visit (INDEPENDENT_AMBULATORY_CARE_PROVIDER_SITE_OTHER): Payer: Medicare Other | Admitting: Gastroenterology

## 2017-10-27 ENCOUNTER — Other Ambulatory Visit: Payer: Self-pay

## 2017-10-27 ENCOUNTER — Encounter: Payer: Self-pay | Admitting: Gastroenterology

## 2017-10-27 VITALS — BP 147/76 | HR 61 | Temp 97.5°F | Ht 63.0 in | Wt 154.8 lb

## 2017-10-27 DIAGNOSIS — R1310 Dysphagia, unspecified: Secondary | ICD-10-CM | POA: Diagnosis not present

## 2017-10-27 DIAGNOSIS — K219 Gastro-esophageal reflux disease without esophagitis: Secondary | ICD-10-CM | POA: Diagnosis not present

## 2017-10-27 NOTE — Progress Notes (Signed)
Gastroenterology Consultation  Referring Provider:     Ria Bush, MD Primary Care Physician:  Ria Bush, MD Primary Gastroenterologist:  Dr. Allen Norris     Reason for Consultation:     GERD        HPI:   Alexandria Moreno is a 76 y.o. y/o female referred for consultation & management of GERD by Dr. Ria Bush, MD.  This patient comes in today with a history of heartburn.  The patient had been helped in the past by Protonix but states it is not working for her any longer.  There is no report of any nausea vomiting fevers or chills. He does report that she has some dysphagia with drinking water and feeling like it goes down the wrong tube and she chokes on it.  There is no report of any unexplained weight loss.  She takes her medication in the evening and states that she does that because she has a lot of burning in her esophagus at night.  The patient has been taking chewable Alka-Seltzer to help with the heartburn.  There is no report of any black stools or bloody stools.  Past Medical History:  Diagnosis Date  . Bilateral carotid bruits 2008   per GI note  . Cancer ([place])    skin  . Diverticulosis   . DJD (degenerative joint disease) of knee   . Frequent headaches   . GERD (gastroesophageal reflux disease)   . H/O transfusion of whole blood 2009  . H/O: GI bleed 2009   due to ASA  . H/O: rheumatic fever    76 years old  . Heart valve problem   . History of chicken pox   . Hyperlipidemia   . Migraines   . Palpitation    controlled with Toprol  . Pelvic prolapse    with rectocele  . Rosacea   . Seasonal allergic rhinitis    mild  . Stress incontinence     Past Surgical History:  Procedure Laterality Date  . ABDOMINAL HYSTERECTOMY  1981  . APPENDECTOMY  1960  . capsule endoscopy  2009   WNL  . CATARACT EXTRACTION Bilateral [id number]  . COLONOSCOPY  2009   diverticulosis ([place])  . DEXA  06/2016   T -0.2 hip, 0.7 spine  . ESOPHAGOGASTRODUODENOSCOPY   2009   focal mild chronic gastritis, neg H pylori ([place])  . INCONTINENCE SURGERY  2001   with mesh  . LIPOMA EXCISION  1971   right shoulder  . [place]  . OVARIAN CYST REMOVAL  1960  . SALPINGOOPHORECTOMY Left 2001  . TONSILLECTOMY  1958    Prior to Admission medications   Medication Sig Start Date End Date Taking? Authorizing Provider  atorvastatin (LIPITOR) 40 MG tablet Take 1 tablet (40 mg total) by mouth daily. 07/13/17  Yes Ria Bush, MD  B COMPLEX VITAMINS PO Take by mouth daily.   Yes [provider]  Calcium Carb-Cholecalciferol (CALCIUM-VITAMIN D3) 600-400 MG-UNIT TABS Take 1 tablet by mouth daily.   Yes [provider]  cholecalciferol (VITAMIN D) 1000 units tablet Take 1,000 Units by mouth daily.   Yes [provider]  cycloSPORINE (RESTASIS) 0.05 % ophthalmic emulsion Apply 0.05 drops to eye 2 (two) times daily.   Yes [provider]  docusate sodium (COLACE) 100 MG capsule Take 1 capsule by mouth daily.   Yes [provider]  glucosamine-chondroitin 500-400 MG tablet Take 1 tablet by mouth 2 (  two) times daily.   Yes [provider]  Methylcellulose, Laxative, (FIBER THERAPY PO) Take 3 tablets by mouth daily.   Yes [provider]  metoprolol succinate (TOPROL XL) 25 MG 24 hr tablet Take 1 tablet (25 mg total) by mouth daily. 07/13/17  Yes Ria Bush, MD  MULTIPLE VITAMIN PO Take by mouth daily.   Yes Roselee Nova, MD  OMEGA-3 FATTY ACIDS PO Take 1,200 mg by mouth daily.  02/27/14  Yes Roselee Nova, MD  pantoprazole (PROTONIX) 40 MG tablet TAKE 1 TABLET DAILY 12/26/16  Yes Lucilla Lame, MD  rizatriptan (MAXALT) 10 MG tablet Take 10 mg by mouth daily as needed.   Yes Roselee Nova, MD  topiramate (TOPAMAX) 100 MG tablet Take 100 mg by mouth 2 (two) times daily.   Yes [provider]    Family History  Problem Relation Age of Onset  . Stroke Mother 62  . Cancer  Mother        Cervical  . Depression Mother   . Dementia Mother        vascular  . Heart disease Father        CABG  . Alcohol abuse Father   . Osteoporosis Father   . Hypertension Brother   . Stroke Brother 87  . Stroke Paternal Grandfather   . Alzheimer's disease Maternal Grandfather   . Breast cancer Neg Hx      Social History   Tobacco Use  . Smoking status: Former Smoker    Last attempt to quit: 12/15/1978    Years since quitting: 38.8  . Smokeless tobacco: Never Used  Substance Use Topics  . Alcohol use: No    Alcohol/week: 0.0 oz    Comment: Rare  . Drug use: No    Allergies as of 10/27/2017 - Review Complete 10/27/2017  Allergen Reaction Noted  . Aspirin Other (See Comments) 03/19/2015  . Baclofen Diarrhea 06/27/2016  . Codeine Nausea And Vomiting 03/19/2015  . Imitrex  [sumatriptan]  03/19/2015  . Influenza vaccines Swelling 01/08/2016  . Vicodin  [hydrocodone-acetaminophen] Nausea And Vomiting 03/19/2015    Review of Systems:    All systems reviewed and negative except where noted in HPI.   Physical Exam:  BP (!) 147/76 (BP Location: Right Arm, Patient Position: Sitting, Cuff Size: Normal)   Pulse 61   Temp (!) 97.5 F (36.4 C) (Oral)   Ht 5\' 3"  (1.6 m)   Wt 154 lb 12.8 oz (70.2 kg)   BMI 27.42 kg/m  No LMP recorded. Patient has had a hysterectomy. Psych:  Alert and cooperative. Normal mood and affect. General:   Alert,  Well-developed, well-nourished, pleasant and cooperative in NAD Head:  Normocephalic and atraumatic. Eyes:  Sclera clear, no icterus.   Conjunctiva pink. Ears:  Normal auditory acuity. Nose:  No deformity, discharge, or lesions. Mouth:  No deformity or lesions,oropharynx pink & moist. Neck:  Supple; no masses or thyromegaly. Lungs:  Respirations even and unlabored.  Clear throughout to auscultation.   No wheezes, crackles, or rhonchi. No acute distress. Heart:  Regular rate and rhythm; no murmurs, clicks, rubs, or  gallops. Abdomen:  Normal bowel sounds.  No bruits.  Soft, non-tender and non-distended without masses, hepatosplenomegaly or hernias noted.  No guarding or rebound tenderness.  Negative Carnett sign.   Rectal:  Deferred.  Msk:  Symmetrical without gross deformities.  Good, equal movement & strength bilaterally. Pulses:  Normal pulses noted. Extremities:  No clubbing  or edema.  No cyanosis. Neurologic:  Alert and oriented x3;  grossly normal neurologically. Skin:  Intact without significant lesions or rashes.  No jaundice. Lymph Nodes:  No significant cervical adenopathy. Psych:  Alert and cooperative. Normal mood and affect.  Imaging Studies: No results found.  Assessment and Plan:   Alexandria Moreno is a 76 y.o. y/o female who has a history of heartburn that is not being helped with her Protonix. Patient is also concerned about the side effects of long-term PPI use.  The patient has been explained the side effects of PPI use and the consequences of long-standing heartburn.  The patient would like to stay on a PPI but states that her present PPIs not working.  The patient has been given samples of Dexilant and has been told to try this.  The patient will also be set up for an EGD and colonoscopy.  The EGD for her dysphagia and chronic reflux and the colonoscopy because she is due for a screening colonoscopy. I have discussed risks & benefits which include, but are not limited to, bleeding, infection, perforation & drug reaction.  The patient agrees with this plan & written consent will be obtained.     Lucilla Lame, MD. [name]   Note: This dictation was prepared with Dragon dictation along with smaller phrase technology. Any transcriptional errors that result from this process are unintentional.

## 2017-10-29 DIAGNOSIS — M6281 Muscle weakness (generalized): Secondary | ICD-10-CM | POA: Diagnosis not present

## 2017-10-29 DIAGNOSIS — M25612 Stiffness of left shoulder, not elsewhere classified: Secondary | ICD-10-CM | POA: Diagnosis not present

## 2017-10-29 DIAGNOSIS — M25512 Pain in left shoulder: Secondary | ICD-10-CM | POA: Diagnosis not present

## 2017-11-02 ENCOUNTER — Other Ambulatory Visit: Payer: Self-pay

## 2017-11-02 ENCOUNTER — Other Ambulatory Visit: Payer: Self-pay | Admitting: Gastroenterology

## 2017-11-02 DIAGNOSIS — M25612 Stiffness of left shoulder, not elsewhere classified: Secondary | ICD-10-CM | POA: Diagnosis not present

## 2017-11-02 DIAGNOSIS — M25512 Pain in left shoulder: Secondary | ICD-10-CM | POA: Diagnosis not present

## 2017-11-02 DIAGNOSIS — M6281 Muscle weakness (generalized): Secondary | ICD-10-CM | POA: Diagnosis not present

## 2017-11-02 DIAGNOSIS — Z1211 Encounter for screening for malignant neoplasm of colon: Secondary | ICD-10-CM

## 2017-11-02 NOTE — Telephone Encounter (Signed)
*  STAT* If patient is at the pharmacy, call can be transferred to refill team.   1. Which medications need to be refilled? (please list name of each medication and dose if known) Dexilant  2. Which pharmacy/location (including street and city if local pharmacy) is medication to be sent to? [facility]   3. Do they need a 30 day or 90 day supply? 90 day

## 2017-11-03 ENCOUNTER — Other Ambulatory Visit: Payer: Self-pay

## 2017-11-03 MED ORDER — DEXLANSOPRAZOLE 60 MG PO CPDR: 60.0000 mg | DELAYED_RELEASE_CAPSULE | Freq: Every day | ORAL | 3 refills | Status: DC

## 2017-11-03 NOTE — Telephone Encounter (Signed)
Rx for Dexilant has been [facility] to [facility] per pt request.

## 2017-11-13 ENCOUNTER — Other Ambulatory Visit: Payer: Medicare Other

## 2017-11-16 DIAGNOSIS — M25612 Stiffness of left shoulder, not elsewhere classified: Secondary | ICD-10-CM | POA: Diagnosis not present

## 2017-11-16 DIAGNOSIS — M25512 Pain in left shoulder: Secondary | ICD-10-CM | POA: Diagnosis not present

## 2017-11-16 DIAGNOSIS — M6281 Muscle weakness (generalized): Secondary | ICD-10-CM | POA: Diagnosis not present

## 2017-11-17 ENCOUNTER — Telehealth: Payer: Self-pay | Admitting: Gastroenterology

## 2017-11-17 NOTE — Telephone Encounter (Signed)
Please call patient. She is worried about if it snows this weekend and she does her prep along with another question. Please call

## 2017-11-18 NOTE — Telephone Encounter (Signed)
Advised pt that she will be notified by [facility] if there will be any reschedules due to the weather. Advised pt to contact our office Monday morning for an update.

## 2017-11-19 DIAGNOSIS — M6281 Muscle weakness (generalized): Secondary | ICD-10-CM | POA: Diagnosis not present

## 2017-11-19 DIAGNOSIS — M25612 Stiffness of left shoulder, not elsewhere classified: Secondary | ICD-10-CM | POA: Diagnosis not present

## 2017-11-19 DIAGNOSIS — M25512 Pain in left shoulder: Secondary | ICD-10-CM | POA: Diagnosis not present

## 2017-11-23 ENCOUNTER — Other Ambulatory Visit: Payer: Self-pay

## 2017-11-23 DIAGNOSIS — Z1211 Encounter for screening for malignant neoplasm of colon: Secondary | ICD-10-CM

## 2017-11-24 ENCOUNTER — Encounter: Admission: RE | Payer: Self-pay | Source: Ambulatory Visit

## 2017-11-24 ENCOUNTER — Ambulatory Visit: Admission: RE | Admit: 2017-11-24 | Payer: Medicare Other | Source: Ambulatory Visit | Admitting: Gastroenterology

## 2017-11-24 SURGERY — COLONOSCOPY WITH PROPOFOL
Anesthesia: General

## 2017-11-25 ENCOUNTER — Encounter: Payer: Self-pay | Admitting: Family Medicine

## 2017-11-25 ENCOUNTER — Ambulatory Visit (INDEPENDENT_AMBULATORY_CARE_PROVIDER_SITE_OTHER): Payer: Medicare Other | Admitting: Family Medicine

## 2017-11-25 ENCOUNTER — Ambulatory Visit (INDEPENDENT_AMBULATORY_CARE_PROVIDER_SITE_OTHER)
Admission: RE | Admit: 2017-11-25 | Discharge: 2017-11-25 | Disposition: A | Discharge status: Home / Self Care | Payer: Medicare Other | Source: Ambulatory Visit | Attending: Family Medicine | Admitting: Family Medicine

## 2017-11-25 ENCOUNTER — Ambulatory Visit: Payer: Medicare Other | Admitting: Family Medicine

## 2017-11-25 VITALS — BP 110/64 | HR 60 | Temp 97.5°F | Ht 63.0 in | Wt 157.8 lb

## 2017-11-25 DIAGNOSIS — M25512 Pain in left shoulder: Secondary | ICD-10-CM

## 2017-11-25 DIAGNOSIS — M7502 Adhesive capsulitis of left shoulder: Secondary | ICD-10-CM

## 2017-11-25 DIAGNOSIS — M19012 Primary osteoarthritis, left shoulder: Secondary | ICD-10-CM | POA: Diagnosis not present

## 2017-11-25 NOTE — Progress Notes (Signed)
Dr. Frederico Hamman T. Janaria Mccammon, MD, [facility] [place], [address] Phone: [phone] Fax: [phone]  11/25/2017  Patient: Alexandria Moreno, MRN: [id number], DOB: October 22, 1941, 76 y.o.  Primary Physician:  Ria Bush, MD   Chief Complaint  Patient presents with  . Follow-up    Left Shoulder Pain   Subjective:   Alexandria Moreno is a 76 y.o. very pleasant female patient who presents with the following:  F/u L shoulder: Pleasant patient who I saw not quite 5 weeks ago, who presents in follow-up for left-sided shoulder pain.  She had some adhesive capsulitis as well as significant pain on the left shoulder on the last office visit.  She has been very compliant with her home rehab program, and she has been doing formal physical therapy.  She has had more pain than expected and some weakness on her rehab, so she is back here for evaluation and recheck.  Pain with doing rehab -   Past Medical History, Surgical History, Social History, Family History, Problem List, Medications, and Allergies have been reviewed and updated if relevant.  Patient Active Problem List   Diagnosis Date Noted  . Polycythemia 10/15/2017  . Hoarseness 07/13/2017  . Left shoulder pain 07/13/2017  . 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 Post-viral cough syndrome 10/24/2016  . Advanced care planning/counseling discussion 06/30/2016  . Chronic left SI joint pain 01/08/2016  . Palpitation 11/19/2015  . Post-nasal drainage 11/19/2015  . Chronic migraine 05/18/2015  . Allergic rhinitis 03/19/2015  . 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 Atypical migraine 03/19/2015  . Dyslipidemia 03/19/2015  . GERD (gastroesophageal reflux disease) 03/19/2015  . H/O: rheumatic fever 03/19/2015  . H/O adenomatous polyp of colon 03/19/2015  . H/O surgical procedure 03/19/2015  . 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 Acne erythematosa 03/19/2015  . 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 Stress incontinence, female 03/19/2015    Past Medical History:  Diagnosis Date  . Bilateral carotid bruits 2008   per GI note  . Cancer ([place])    skin  . Diverticulosis   . DJD (degenerative joint disease) of knee   . Frequent headaches   . GERD (gastroesophageal reflux disease)   . H/O transfusion of whole blood 2009  . H/O: GI bleed 2009   due to ASA  . H/O: rheumatic fever    76 years old  . Heart valve problem   . History of chicken pox   . Hyperlipidemia   . Migraines   . Palpitation    controlled with Toprol  . Pelvic prolapse    with rectocele  . Rosacea   . Seasonal allergic rhinitis    mild  . Stress incontinence     Past Surgical History:  Procedure Laterality Date  . ABDOMINAL HYSTERECTOMY  1981  . APPENDECTOMY  1960  . capsule endoscopy  2009   WNL  . CATARACT EXTRACTION Bilateral [id number]  . COLONOSCOPY  2009   diverticulosis ([place])  . DEXA  06/2016   T -0.2 hip, 0.7 spine  . ESOPHAGOGASTRODUODENOSCOPY  2009   focal mild chronic gastritis, neg H pylori ([place])  . INCONTINENCE SURGERY  2001   with mesh  . LIPOMA EXCISION  1971   right shoulder  . [place]  . OVARIAN CYST REMOVAL  1960  . SALPINGOOPHORECTOMY Left 2001  . TONSILLECTOMY  1958    Social History   Socioeconomic History  .  Marital status: Married    Spouse name: Not on file  . Number of children: Not on file  . Years of education: Not on file  . Highest education level: Not on file  Social Needs  . Financial resource strain: Not on file  . Food insecurity - worry: Not on file  . Food insecurity - inability: Not on file  . Transportation needs - medical: Not on file  . Transportation needs - non-medical: Not on file  Occupational History  . Not on file  Tobacco Use  . Smoking status: Former Smoker    Last attempt to quit: 12/15/1978    Years since quitting: 38.9  . Smokeless tobacco: Never Used    Substance and Sexual Activity  . Alcohol use: No    Alcohol/week: 0.0 oz    Comment: Rare  . Drug use: No  . Sexual activity: No  Other Topics Concern  . Not on file  Social History Narrative   Lives with husband    Occupation: retired, was Art therapist   Edu: some college   Activity: stationary bicycle   Diet: good water, following slim fast diet, good vegetables    Family History  Problem Relation Age of Onset  . Stroke Mother 69  . Cancer Mother        Cervical  . Depression Mother   . Dementia Mother        vascular  . Heart disease Father        CABG  . Alcohol abuse Father   . Osteoporosis Father   . Hypertension Brother   . Stroke Brother 8  . Stroke Paternal Grandfather   . Alzheimer's disease Maternal Grandfather   . Breast cancer Neg Hx     Allergies  Allergen Reactions  . Aspirin Other (See Comments)    GI bleed  . Baclofen Diarrhea  . Codeine Nausea And Vomiting  . Imitrex  [Sumatriptan]     Palpatations  . Influenza Vaccines Swelling  . Vicodin  [Hydrocodone-Acetaminophen] Nausea And Vomiting    Medication list reviewed and updated in full in [place].  GEN: No fevers, chills. Nontoxic. Primarily MSK c/o today. MSK: Detailed in the HPI GI: tolerating PO intake without difficulty Neuro: No numbness, parasthesias, or tingling associated. Otherwise the pertinent positives of the ROS are noted above.   Objective:   BP 110/64   Pulse 60   Temp (!) 97.5 F (36.4 C) (Oral)   Ht 5\' 3"  (1.6 m)   Wt 157 lb 12 oz (71.6 kg)   BMI 27.94 kg/m    GEN: WDWN, NAD, Non-toxic, Alert & Oriented x 3 HEENT: Atraumatic, Normocephalic.  Ears and Nose: No external deformity. EXTR: No clubbing/cyanosis/edema NEURO: Normal gait.  PSYCH: Normally interactive. Conversant. Not depressed or anxious appearing.  Calm demeanor.   Shoulder: R and L Inspection: No muscle wasting or winging Ecchymosis/edema: neg  AC joint, scapula, clavicle:  NT Cervical spine: NT, full ROM Spurling's: neg ABNORMAL SIDE TESTED: L UNLESS OTHERWISE NOTED, THE CONTRALATERAL SIDE HAS FULL RANGE OF MOTION. Abduction: 4/5, LIMITED TO 145 DEGREES Flexion: 4/5, LIMITED TO 165 DEGNO ROM  IR, lift-off: 5/5. TESTED AT 90 DEGREES OF ABDUCTION, LIMITED TO 0 DEGREES ER at neutral:  4/5, TESTED AT 90 DEGREES OF ABDUCTION, LIMITED TO 75 DEGREES AC crossover and compression: PAIN Drop Test: causes pain Empty Can: neg Supraspinatus insertion: NT Bicipital groove: NT ALL OTHER SPECIAL TESTING EQUIVOCAL GIVEN LOSS OF MOTION C5-T1 intact Sensation intact  Grip 5/5    Radiology: Dg Shoulder Left  Result Date: 11/26/2017 CLINICAL DATA:  76 year old female with a history of possible rotator cuff tear EXAM: LEFT SHOULDER - 2+ VIEW COMPARISON:  None. FINDINGS: No acute fracture identified. Glenohumeral joint appears located. Unremarkable appearance the proximal humerus. The acromiohumeral interval is not significantly narrowed. No radiopaque foreign body. Degenerative changes at the acromioclavicular joint. IMPRESSION: Negative for acute bony abnormality. Degenerative changes at the acromioclavicular joint. Electronically Signed   By: Corrie Mckusick D.O.   On: 11/26/2017 08:28     Assessment and Plan:   Left shoulder pain, unspecified chronicity - Plan: DG Shoulder Left, MR Shoulder Left Wo Contrast  Adhesive capsulitis of left shoulder - Plan: DG Shoulder Left  Frozen shoulder, with concern for possible other occult injury to the shoulder.  With weakness and pain, pain with drop test, failure to improve after multiple months of conservative care including physical therapy, obtain an MRI of the left shoulder to evaluate for potential rotator cuff tear.  Follow-up: No Follow-up on file.  Future Appointments  Date Time Provider [place]  12/21/2017  2:00 PM Owens Loffler, MD [facility]  07/07/2018  9:45 AM Eustace Pen, LPN [facility]   07/17/1516  2:30 PM Ria Bush, MD [facility]    No orders of the defined types were placed in this encounter.  Medications Discontinued During This Encounter  Medication Reason  . pantoprazole (PROTONIX) 40 MG tablet Change in therapy   Orders Placed This Encounter  Procedures  . DG Shoulder Left  . MR Shoulder Left Wo Contrast    Signed,  Jourdan Maldonado T. Clevland Cork, MD   Allergies as of 11/25/2017      Reactions   Aspirin Other (See Comments)   GI bleed   Baclofen Diarrhea   Codeine Nausea And Vomiting   Imitrex  [sumatriptan]    Palpatations   Influenza Vaccines Swelling   Vicodin  [hydrocodone-acetaminophen] Nausea And Vomiting      Medication List        Accurate as of 11/25/17 11:59 PM. Always use your most recent med list.          atorvastatin 40 MG tablet Commonly known as:  LIPITOR Take 1 tablet (40 mg total) by mouth daily.   B COMPLEX VITAMINS PO Take by mouth daily.   Calcium-Vitamin D3 600-400 MG-UNIT Tabs Take 1 tablet by mouth daily.   chlorhexidine 0.12 % solution Commonly known as:  PERIDEX   cholecalciferol 1000 units tablet Commonly known as:  VITAMIN D Take 1,000 Units by mouth daily.   cycloSPORINE 0.05 % ophthalmic emulsion Commonly known as:  RESTASIS Apply 0.05 drops to eye 2 (two) times daily.   dexlansoprazole 60 MG capsule Commonly known as:  DEXILANT Take 1 capsule (60 mg total) by mouth daily.   docusate sodium 100 MG capsule Commonly known as:  COLACE Take 1 capsule by mouth daily.   FIBER THERAPY PO Take 3 tablets by mouth daily.   glucosamine-chondroitin 500-400 MG tablet Take 1 tablet by mouth 2 (two) times daily.   metoprolol succinate 25 MG 24 hr tablet Commonly known as:  TOPROL XL Take 1 tablet (25 mg total) by mouth daily.   MULTIPLE VITAMIN PO Take by mouth daily.   OMEGA-3 FATTY ACIDS PO Take 1,200 mg by mouth daily.   rizatriptan 10 MG tablet Commonly known as:  MAXALT Take 10 mg by mouth  daily as needed.   topiramate 100 MG tablet  Commonly known as:  TOPAMAX Take 100 mg by mouth 2 (two) times daily.

## 2017-11-25 NOTE — Patient Instructions (Signed)

## 2017-11-26 ENCOUNTER — Encounter: Payer: Self-pay | Admitting: Family Medicine

## 2017-11-30 ENCOUNTER — Telehealth: Payer: Self-pay | Admitting: Gastroenterology

## 2017-11-30 ENCOUNTER — Encounter: Payer: Self-pay | Admitting: *Deleted

## 2017-11-30 NOTE — Telephone Encounter (Signed)
Patient Alexandria Moreno and wants to know if she can take topamas 100 mg in the morning before her procedure?

## 2017-11-30 NOTE — Telephone Encounter (Signed)
Advised pt to hold her topamax tomorrow until after her procedure is done.

## 2017-12-01 ENCOUNTER — Ambulatory Visit: Payer: Medicare Other | Admitting: Anesthesiology

## 2017-12-01 ENCOUNTER — Encounter: Payer: Self-pay | Admitting: *Deleted

## 2017-12-01 ENCOUNTER — Encounter
Admission: RE | Disposition: A | Discharge status: Home / Self Care | Payer: Self-pay | Source: Ambulatory Visit | Attending: Gastroenterology

## 2017-12-01 ENCOUNTER — Ambulatory Visit
Admission: RE | Admit: 2017-12-01 | Discharge: 2017-12-01 | Disposition: A | Discharge status: Home / Self Care | Payer: Medicare Other | Source: Ambulatory Visit | Attending: Gastroenterology | Admitting: Gastroenterology

## 2017-12-01 DIAGNOSIS — K219 Gastro-esophageal reflux disease without esophagitis: Secondary | ICD-10-CM | POA: Diagnosis not present

## 2017-12-01 DIAGNOSIS — R1310 Dysphagia, unspecified: Secondary | ICD-10-CM | POA: Diagnosis not present

## 2017-12-01 DIAGNOSIS — K5730 Diverticulosis of large intestine without perforation or abscess without bleeding: Secondary | ICD-10-CM | POA: Diagnosis not present

## 2017-12-01 DIAGNOSIS — E785 Hyperlipidemia, unspecified: Secondary | ICD-10-CM | POA: Diagnosis not present

## 2017-12-01 DIAGNOSIS — R12 Heartburn: Secondary | ICD-10-CM

## 2017-12-01 DIAGNOSIS — Z79899 Other long term (current) drug therapy: Secondary | ICD-10-CM | POA: Insufficient documentation

## 2017-12-01 DIAGNOSIS — K5790 Diverticulosis of intestine, part unspecified, without perforation or abscess without bleeding: Secondary | ICD-10-CM | POA: Diagnosis not present

## 2017-12-01 DIAGNOSIS — Z85828 Personal history of other malignant neoplasm of skin: Secondary | ICD-10-CM | POA: Diagnosis not present

## 2017-12-01 DIAGNOSIS — Z87891 Personal history of nicotine dependence: Secondary | ICD-10-CM | POA: Insufficient documentation

## 2017-12-01 DIAGNOSIS — Z1211 Encounter for screening for malignant neoplasm of colon: Secondary | ICD-10-CM | POA: Diagnosis not present

## 2017-12-01 DIAGNOSIS — R1319 Other dysphagia: Secondary | ICD-10-CM

## 2017-12-01 DIAGNOSIS — D126 Benign neoplasm of colon, unspecified: Secondary | ICD-10-CM | POA: Diagnosis not present

## 2017-12-01 DIAGNOSIS — K635 Polyp of colon: Secondary | ICD-10-CM | POA: Diagnosis not present

## 2017-12-01 DIAGNOSIS — D120 Benign neoplasm of cecum: Secondary | ICD-10-CM | POA: Insufficient documentation

## 2017-12-01 DIAGNOSIS — G43909 Migraine, unspecified, not intractable, without status migrainosus: Secondary | ICD-10-CM | POA: Insufficient documentation

## 2017-12-01 HISTORY — PX: COLONOSCOPY WITH PROPOFOL: SHX5780

## 2017-12-01 HISTORY — PX: ESOPHAGOGASTRODUODENOSCOPY (EGD) WITH PROPOFOL: SHX5813

## 2017-12-01 SURGERY — COLONOSCOPY WITH PROPOFOL
Anesthesia: General

## 2017-12-01 MED ORDER — SODIUM CHLORIDE 0.9 % IV SOLN
INTRAVENOUS | Status: DC
  Administered 2017-12-01: 08:00:00 via INTRAVENOUS

## 2017-12-01 MED ORDER — PROPOFOL 500 MG/50ML IV EMUL
INTRAVENOUS | Status: DC | PRN
  Administered 2017-12-01: 120 ug/kg/min via INTRAVENOUS

## 2017-12-01 MED ORDER — EPHEDRINE SULFATE 50 MG/ML IJ SOLN
INTRAMUSCULAR | Status: DC | PRN
Start: 2017-12-01 — End: 2017-12-01
  Administered 2017-12-01: 10 mg via INTRAVENOUS

## 2017-12-01 MED ORDER — PROPOFOL 10 MG/ML IV BOLUS
INTRAVENOUS | Status: AC
  Filled 2017-12-01: qty 20

## 2017-12-01 MED ORDER — PROPOFOL 10 MG/ML IV BOLUS
INTRAVENOUS | Status: DC | PRN
  Administered 2017-12-01: 40 mg via INTRAVENOUS

## 2017-12-01 MED ORDER — LIDOCAINE HCL (PF) 2 % IJ SOLN
INTRAMUSCULAR | Status: AC
  Filled 2017-12-01: qty 10

## 2017-12-01 MED ORDER — EPHEDRINE SULFATE 50 MG/ML IJ SOLN
INTRAMUSCULAR | Status: AC
  Filled 2017-12-01: qty 1

## 2017-12-01 NOTE — H&P (Signed)
Alexandria Lame, MD [facility] [address], [place],  [address] [phone] Fax : [phone]  Primary Care Physician:  Ria Bush, MD Primary Gastroenterologist:  Dr. Allen Norris  Pre-Procedure History & Physical: HPI:  Alexandria Moreno is a 76 y.o. female is here for an endoscopy and colonoscopy.   Past Medical History:  Diagnosis Date  . Bilateral carotid bruits 2008   per GI note  . Cancer ([place])    skin  . Diverticulosis   . DJD (degenerative joint disease) of knee   . Frequent headaches   . GERD (gastroesophageal reflux disease)   . H/O transfusion of whole blood 2009  . H/O: GI bleed 2009   due to ASA  . H/O: rheumatic fever    77 years old  . Heart valve problem   . History of chicken pox   . Hyperlipidemia   . Migraines   . Palpitation    controlled with Toprol  . Pelvic prolapse    with rectocele  . Rosacea   . Seasonal allergic rhinitis    mild  . Stress incontinence     Past Surgical History:  Procedure Laterality Date  . ABDOMINAL HYSTERECTOMY  1981  . APPENDECTOMY  1960  . capsule endoscopy  2009   WNL  . CATARACT EXTRACTION Bilateral [id number]  . COLONOSCOPY  2009   diverticulosis ([place])  . DEXA  06/2016   T -0.2 hip, 0.7 spine  . ESOPHAGOGASTRODUODENOSCOPY  2009   focal mild chronic gastritis, neg H pylori ([place])  . INCONTINENCE SURGERY  2001   with mesh  . LIPOMA EXCISION  1971   right shoulder  . [place]  . OVARIAN CYST REMOVAL  1960  . SALPINGOOPHORECTOMY Left 2001  . TONSILLECTOMY  1958    Prior to Admission medications   Medication Sig Start Date End Date Taking? Authorizing Provider  atorvastatin (LIPITOR) 40 MG tablet Take 1 tablet (40 mg total) by mouth daily. 07/13/17  Yes Ria Bush, MD  B COMPLEX VITAMINS PO Take by mouth daily.   Yes [provider]  Calcium Carb-Cholecalciferol (CALCIUM-VITAMIN D3) 600-400 MG-UNIT TABS Take 1 tablet by mouth daily.   Yes [provider]  cholecalciferol (VITAMIN D) 1000 units tablet Take 1,000 Units by mouth daily.   Yes [provider]  cycloSPORINE (RESTASIS) 0.05 % ophthalmic emulsion Apply 0.05 drops to eye 2 (two) times daily.   Yes [provider]  dexlansoprazole (DEXILANT) 60 MG capsule Take 1 capsule (60 mg total) by mouth daily. 11/03/17  Yes Alexandria Lame, MD  docusate sodium (COLACE) 100 MG capsule Take 1 capsule by mouth daily.   Yes [provider]  glucosamine-chondroitin 500-400 MG tablet Take 1 tablet by mouth 2 (two) times daily.   Yes [provider]  metoprolol succinate (TOPROL XL) 25 MG 24 hr tablet Take 1 tablet (25 mg total) by mouth daily. 07/13/17  Yes Ria Bush, MD  MULTIPLE VITAMIN PO Take by mouth daily.   Yes Roselee Nova, MD  OMEGA-3 FATTY ACIDS PO Take 1,200 mg by mouth daily.  02/27/14  Yes Roselee Nova, MD  rizatriptan (MAXALT) 10 MG tablet Take 10 mg by mouth daily as needed.   Yes Roselee Nova, MD  topiramate (TOPAMAX) 100 MG tablet Take 100 mg by mouth 2 (two) times daily.   Yes [provider]  chlorhexidine (PERIDEX) 0.12 % solution  11/10/17   [provider]  Methylcellulose, Laxative, (FIBER THERAPY PO) Take 3 tablets by mouth daily.    [provider]    Allergies as of 11/23/2017 - Review Complete 10/27/2017  Allergen Reaction Noted  . Aspirin Other (See Comments) 03/19/2015  . Baclofen Diarrhea 06/27/2016  . Codeine Nausea And Vomiting 03/19/2015  . Imitrex  [sumatriptan]  03/19/2015  . Influenza vaccines Swelling 01/08/2016  . Vicodin  [hydrocodone-acetaminophen] Nausea And Vomiting 03/19/2015    Family History  Problem Relation Age of Onset  . Stroke Mother 38  . Cancer Mother        Cervical  . Depression Mother   . Dementia Mother        vascular  . Heart disease Father        CABG  . Alcohol abuse Father   . Osteoporosis Father   . Hypertension Brother   . Stroke  Brother 60  . Stroke Paternal Grandfather   . Alzheimer's disease Maternal Grandfather   . Breast cancer Neg Hx     Social History   Socioeconomic History  . Marital status: Married    Spouse name: Not on file  . Number of children: Not on file  . Years of education: Not on file  . Highest education level: Not on file  Social Needs  . Financial resource strain: Not on file  . Food insecurity - worry: Not on file  . Food insecurity - inability: Not on file  . Transportation needs - medical: Not on file  . Transportation needs - non-medical: Not on file  Occupational History  . Not on file  Tobacco Use  . Smoking status: Former Smoker    Last attempt to quit: 12/15/1978    Years since quitting: 38.9  . Smokeless tobacco: Never Used  Substance and Sexual Activity  . Alcohol use: No    Alcohol/week: 0.0 oz    Comment: Rare  . Drug use: No  . Sexual activity: No  Other Topics Concern  . Not on file  Social History Narrative   Lives with husband    Occupation: retired, was Art therapist   Edu: some college   Activity: stationary bicycle   Diet: good water, following slim fast diet, good vegetables    Review of Systems: See HPI, otherwise negative ROS  Physical Exam: BP 137/60   Pulse 65   Temp (!) 97 F (36.1 C) (Tympanic)   Resp 18   Ht 5\' 3"  (1.6 m)   Wt 148 lb (67.1 kg)   SpO2 100%   BMI 26.22 kg/m  General:   Alert,  pleasant and cooperative in NAD Head:  Normocephalic and atraumatic. Neck:  Supple; no masses or thyromegaly. Lungs:  Clear throughout to auscultation.    Heart:  Regular rate and rhythm. Abdomen:  Soft, nontender and nondistended. Normal bowel sounds, without guarding, and without rebound.   Neurologic:  Alert and  oriented x4;  grossly normal neurologically.  Impression/Plan: Alexandria Moreno is here for an endoscopy and colonoscopy to be performed for Dysphagia, GERD and screening colonoscopy  Risks, benefits, limitations, and  alternatives regarding  endoscopy and colonoscopy have been reviewed with the patient.  Questions have been answered.  All parties agreeable.   Alexandria Lame, MD  12/01/2017, 8:29 AM

## 2017-12-01 NOTE — Op Note (Signed)
[facility] Gastroenterology Patient Name: Alexandria Moreno Procedure Date: 12/01/2017 8:39 AM MRN: [id number] Account #: [id number] Date of Birth: 1941/06/29 Admit Type: Outpatient Age: 76 Room: [facility] ENDO ROOM 4 Gender: Female Note Status: Finalized Procedure:            Colonoscopy Indications:          Screening for colorectal malignant neoplasm Providers:            Lucilla Lame MD, MD Referring MD:         Ria Bush (Referring MD) Medicines:            Propofol per Anesthesia Complications:        No immediate complications. Procedure:            Pre-Anesthesia Assessment:                       - Prior to the procedure, a History and Physical was                        performed, and patient medications and allergies were                        reviewed. The patient's tolerance of previous                        anesthesia was also reviewed. The risks and benefits of                        the procedure and the sedation options and risks were                        discussed with the patient. All questions were                        answered, and informed consent was obtained. Prior                        Anticoagulants: The patient has taken no previous                        anticoagulant or antiplatelet agents. ASA Grade                        Assessment: II - A patient with mild systemic disease.                        After reviewing the risks and benefits, the patient was                        deemed in satisfactory condition to undergo the                        procedure.                       After obtaining informed consent, the colonoscope was                        passed under direct vision. Throughout the procedure,  the patient's blood pressure, pulse, and oxygen                        saturations were monitored continuously. The                        Colonoscope was introduced through the anus and         advanced to the the cecum, identified by appendiceal                        orifice and ileocecal valve. The colonoscopy was                        performed without difficulty. The patient tolerated the                        procedure well. The quality of the bowel preparation                        was excellent. Findings:      The perianal and digital rectal examinations were normal.      A 2 mm polyp was found in the cecum. The polyp was sessile. The polyp       was removed with a cold biopsy forceps. Resection and retrieval were       complete.      A few small-mouthed diverticula were found in the sigmoid colon. Impression:           - One 2 mm polyp in the cecum, removed with a cold                        biopsy forceps. Resected and retrieved.                       - Diverticulosis in the sigmoid colon. Recommendation:       - Discharge patient to home.                       - Resume previous diet.                       - Continue present medications.                       - Await pathology results. Procedure Code(s):    --- Professional ---                       [phone], Colonoscopy, flexible; with biopsy, single or                        multiple Diagnosis Code(s):    --- Professional ---                       Z12.11, Encounter for screening for malignant neoplasm                        of colon                       D12.0, Benign neoplasm of cecum CPT copyright 2016 American Medical Association. All rights reserved. The codes  documented in this report are preliminary and upon coder review may  be revised to meet current compliance requirements. Lucilla Lame MD, MD 12/01/2017 9:05:40 AM This report has been signed electronically. Number of Addenda: 0 Note Initiated On: 12/01/2017 8:39 AM Scope Withdrawal Time: 0 hours 6 minutes 14 seconds  Total Procedure Duration: 0 hours 9 minutes 4 seconds       [facility]

## 2017-12-01 NOTE — Anesthesia Preprocedure Evaluation (Signed)
Anesthesia Evaluation  Patient identified by MRN, date of birth, ID band Patient awake    Reviewed: Allergy & Precautions, NPO status , Patient's Chart, lab work & pertinent test results  Airway Mallampati: II       Dental  (+) Teeth Intact   Pulmonary former smoker,    breath sounds clear to auscultation       Cardiovascular Exercise Tolerance: Good  Rhythm:Regular     Neuro/Psych  Headaches,    GI/Hepatic Neg liver ROS, GERD  Medicated,  Endo/Other  Hypothyroidism   Renal/GU negative Renal ROS     Musculoskeletal   Abdominal Normal abdominal exam  (+)   Peds negative pediatric ROS (+)  Hematology negative hematology ROS (+)   Anesthesia Other Findings   Reproductive/Obstetrics                             Anesthesia Physical Anesthesia Plan  ASA: II  Anesthesia Plan: General   Post-op Pain Management:    Induction: Intravenous  PONV Risk Score and Plan: 0  Airway Management Planned: Natural Airway and Nasal Cannula  Additional Equipment:   Intra-op Plan:   Post-operative Plan:   Informed Consent: I have reviewed the patients History and Physical, chart, labs and discussed the procedure including the risks, benefits and alternatives for the proposed anesthesia with the patient or authorized representative who has indicated his/her understanding and acceptance.     Plan Discussed with: Surgeon  Anesthesia Plan Comments:         Anesthesia Quick Evaluation

## 2017-12-01 NOTE — Anesthesia Post-op Follow-up Note (Signed)
Anesthesia QCDR form completed.        

## 2017-12-01 NOTE — Op Note (Signed)
[facility] Patient Name: Alexandria Moreno Procedure Date: 12/01/2017 8:39 AM MRN: [id number] Account #: [id number] Date of Birth: 1941/11/24 Admit Type: Outpatient Age: 76 Room: [facility] ENDO ROOM 4 Gender: Female Note Status: Finalized Procedure:            Upper GI endoscopy Indications:          Dysphagia, Heartburn Providers:            Lucilla Lame MD, MD Referring MD:         Ria Bush (Referring MD) Medicines:            Propofol per Anesthesia Complications:        No immediate complications. Procedure:            Pre-Anesthesia Assessment:                       - Prior to the procedure, a History and Physical was                        performed, and patient medications and allergies were                        reviewed. The patient's tolerance of previous                        anesthesia was also reviewed. The risks and benefits of                        the procedure and the sedation options and risks were                        discussed with the patient. All questions were                        answered, and informed consent was obtained. Prior                        Anticoagulants: The patient has taken no previous                        anticoagulant or antiplatelet agents. ASA Grade                        Assessment: II - A patient with mild systemic disease.                        After reviewing the risks and benefits, the patient was                        deemed in satisfactory condition to undergo the                        procedure.                       After obtaining informed consent, the endoscope was                        passed under direct vision. Throughout the procedure,  the patient's blood pressure, pulse, and oxygen                        saturations were monitored continuously. The Endoscope                        was introduced through the mouth, and advanced to the       second part of duodenum. The upper GI endoscopy was                        accomplished without difficulty. The patient tolerated                        the procedure well. Findings:      The examined esophagus was normal.      The stomach was normal.      The examined duodenum was normal.      A TTS dilator was passed through the scope. Dilation with a 15-16.5-18       mm balloon dilator was performed to 18 mm in the entire esophagus. Impression:           - Normal esophagus.                       - Normal stomach.                       - Normal examined duodenum.                       - Dilation performed in the entire esophagus.                       - No specimens collected. Recommendation:       - Discharge patient to home.                       - Resume previous diet.                       - Continue present medications. Procedure Code(s):    --- Professional ---                       [phone], Esophagogastroduodenoscopy, flexible, transoral;                        with transendoscopic balloon dilation of esophagus                        (less than 30 mm diameter) Diagnosis Code(s):    --- Professional ---                       R13.10, Dysphagia, unspecified                       R12, Heartburn CPT copyright 2016 American Medical Association. All rights reserved. The codes documented in this report are preliminary and upon coder review may  be revised to meet current compliance requirements. Lucilla Lame MD, MD 12/01/2017 8:52:22 AM This report has been signed electronically. Number of Addenda: 0 Note Initiated On: 12/01/2017 8:39 AM      [facility]

## 2017-12-01 NOTE — Transfer of Care (Signed)
Immediate Anesthesia Transfer of Care Note  Patient: Alexandria Moreno  Procedure(s) Performed: COLONOSCOPY WITH PROPOFOL (N/A ) ESOPHAGOGASTRODUODENOSCOPY (EGD) WITH PROPOFOL (N/A )  Patient Location: PACU  Anesthesia Type:General  Level of Consciousness: awake  Airway & Oxygen Therapy: Patient Spontanous Breathing and Patient connected to nasal cannula oxygen  Post-op Assessment: Report given to RN and Post -op Vital signs reviewed and stable  Post vital signs: Reviewed  Last Vitals:  Vitals:   12/01/17 0744  BP: 137/60  Pulse: 65  Resp: 18  Temp: (!) 36.1 C  SpO2: 100%    Last Pain:  Vitals:   12/01/17 0744  TempSrc: Tympanic         Complications: No apparent anesthesia complications

## 2017-12-01 NOTE — Anesthesia Postprocedure Evaluation (Signed)
Anesthesia Post Note  Patient: Alexandria Moreno  Procedure(s) Performed: COLONOSCOPY WITH PROPOFOL (N/A ) ESOPHAGOGASTRODUODENOSCOPY (EGD) WITH PROPOFOL (N/A )  Patient location during evaluation: PACU Anesthesia Type: General Level of consciousness: awake Pain management: pain level controlled Vital Signs Assessment: post-procedure vital signs reviewed and stable Respiratory status: spontaneous breathing Cardiovascular status: stable Anesthetic complications: no     Last Vitals:  Vitals:   12/01/17 0919 12/01/17 0929  BP: (!) 117/95 (!) 114/54  Pulse: 76 61  Resp: 13 (!) 9  Temp:    SpO2: 100% 100%    Last Pain:  Vitals:   12/01/17 0929  TempSrc:   PainSc: 0-No pain                 VAN STAVEREN,Glorya Bartley

## 2017-12-02 ENCOUNTER — Encounter: Payer: Self-pay | Admitting: Gastroenterology

## 2017-12-02 LAB — SURGICAL PATHOLOGY

## 2017-12-05 ENCOUNTER — Encounter: Payer: Self-pay | Admitting: Family Medicine

## 2017-12-10 ENCOUNTER — Ambulatory Visit
Admission: RE | Admit: 2017-12-10 | Discharge: 2017-12-10 | Disposition: A | Discharge status: Home / Self Care | Payer: Medicare Other | Source: Ambulatory Visit | Attending: Family Medicine | Admitting: Family Medicine

## 2017-12-10 DIAGNOSIS — M19012 Primary osteoarthritis, left shoulder: Secondary | ICD-10-CM | POA: Diagnosis not present

## 2017-12-10 DIAGNOSIS — R937 Abnormal findings on diagnostic imaging of other parts of musculoskeletal system: Secondary | ICD-10-CM | POA: Diagnosis not present

## 2017-12-10 DIAGNOSIS — M25512 Pain in left shoulder: Secondary | ICD-10-CM | POA: Diagnosis not present

## 2017-12-21 ENCOUNTER — Ambulatory Visit: Payer: Medicare Other | Admitting: Family Medicine

## 2017-12-21 ENCOUNTER — Telehealth: Payer: Self-pay | Admitting: Gastroenterology

## 2017-12-21 DIAGNOSIS — M25512 Pain in left shoulder: Secondary | ICD-10-CM | POA: Diagnosis not present

## 2017-12-21 DIAGNOSIS — M6281 Muscle weakness (generalized): Secondary | ICD-10-CM | POA: Diagnosis not present

## 2017-12-21 DIAGNOSIS — M25612 Stiffness of left shoulder, not elsewhere classified: Secondary | ICD-10-CM | POA: Diagnosis not present

## 2017-12-21 NOTE — Telephone Encounter (Signed)
Patient called and left message on answering machine. She would like Alexandria Moreno to call her to discuss symptoms she is still having from her colonoscopy done a few weeks ago by Dr Allen Norris.

## 2017-12-22 NOTE — Telephone Encounter (Signed)
Pt stated she has been having diarrhea for 3 weeks since her colonoscopy. She says every time she eats she has to go to the bathroom. She has never had this issue in the past. Advised pt to increase her fiber and start a daily probiotic. Pt has also been advised she can add imodium but only take what is directed on the box. Pt will give this a try and call me back if no relief. Pt has also been advised to stay way from fried greasy foods for now.

## 2017-12-24 DIAGNOSIS — M25512 Pain in left shoulder: Secondary | ICD-10-CM | POA: Diagnosis not present

## 2017-12-24 DIAGNOSIS — M25612 Stiffness of left shoulder, not elsewhere classified: Secondary | ICD-10-CM | POA: Diagnosis not present

## 2017-12-24 DIAGNOSIS — M6281 Muscle weakness (generalized): Secondary | ICD-10-CM | POA: Diagnosis not present

## 2017-12-31 ENCOUNTER — Telehealth: Payer: Self-pay | Admitting: Gastroenterology

## 2017-12-31 NOTE — Telephone Encounter (Signed)
Patient called and is still struggling with diarrhea. She finished the probiotics, please call.

## 2017-12-31 NOTE — Telephone Encounter (Signed)
Please advise 

## 2017-12-31 NOTE — Telephone Encounter (Signed)
The patient had a screening colonoscopy.  To note the diarrhea started before or after the colonoscopy?

## 2018-01-01 NOTE — Telephone Encounter (Signed)
Diarrhea started after the colonoscopy.

## 2018-01-03 NOTE — Telephone Encounter (Signed)
Lets try her on Imodium before every meal and at bedtime

## 2018-01-04 ENCOUNTER — Telehealth: Payer: Self-pay | Admitting: Gastroenterology

## 2018-01-04 DIAGNOSIS — M25512 Pain in left shoulder: Secondary | ICD-10-CM | POA: Diagnosis not present

## 2018-01-04 DIAGNOSIS — M6281 Muscle weakness (generalized): Secondary | ICD-10-CM | POA: Diagnosis not present

## 2018-01-04 DIAGNOSIS — M25612 Stiffness of left shoulder, not elsewhere classified: Secondary | ICD-10-CM | POA: Diagnosis not present

## 2018-01-04 NOTE — Telephone Encounter (Signed)
LVM for pt to return my call.

## 2018-01-04 NOTE — Telephone Encounter (Signed)
PATIENT CALLED & L/M ON ANSWERING MACHINE & WOULD LIKE TO TALK WITH GINGER ABOUT RECURRING SYMPTOMS FROM COLONOSCOPY.PLEASE CALL PATIENT AT [phone].

## 2018-01-05 NOTE — Telephone Encounter (Signed)
Patient left a voice message for you to call her at [phone] regarding symptoms.

## 2018-01-05 NOTE — Telephone Encounter (Signed)
Spoke with pt and she stated her diarrhea is getting better. She is taking no more than 4 imodium a day. Will try to start reducing to see if diarrhea returns. She will call me if no improvement in her symptoms.

## 2018-01-07 NOTE — Telephone Encounter (Signed)
Pt called back this morning stating she thought she had turned a corner with the diarrhea, but it has returned. Can we send some Lomotil to her pharmacy to try? She said this has been going on since her colonoscopy.

## 2018-01-07 NOTE — Telephone Encounter (Signed)
Yes. Please give her lomotil and have her stop by for some VSL#3

## 2018-01-08 ENCOUNTER — Other Ambulatory Visit: Payer: Self-pay

## 2018-01-08 MED ORDER — DIPHENOXYLATE-ATROPINE 2.5-0.025 MG PO TABS: 1.0000 | ORAL_TABLET | Freq: Four times a day (QID) | ORAL | 5 refills | Status: DC | PRN

## 2018-01-08 NOTE — Telephone Encounter (Signed)
Pt notified of medication sent to her pharmacy. Pt will stop by to pick up VSL #3.

## 2018-01-11 DIAGNOSIS — M25612 Stiffness of left shoulder, not elsewhere classified: Secondary | ICD-10-CM | POA: Diagnosis not present

## 2018-01-11 DIAGNOSIS — M25512 Pain in left shoulder: Secondary | ICD-10-CM | POA: Diagnosis not present

## 2018-01-11 DIAGNOSIS — M6281 Muscle weakness (generalized): Secondary | ICD-10-CM | POA: Diagnosis not present

## 2018-01-14 DIAGNOSIS — M25612 Stiffness of left shoulder, not elsewhere classified: Secondary | ICD-10-CM | POA: Diagnosis not present

## 2018-01-14 DIAGNOSIS — M25512 Pain in left shoulder: Secondary | ICD-10-CM | POA: Diagnosis not present

## 2018-01-14 DIAGNOSIS — M6281 Muscle weakness (generalized): Secondary | ICD-10-CM | POA: Diagnosis not present

## 2018-01-18 ENCOUNTER — Encounter: Payer: Self-pay | Admitting: Family Medicine

## 2018-01-18 ENCOUNTER — Other Ambulatory Visit: Payer: Self-pay

## 2018-01-18 ENCOUNTER — Ambulatory Visit (INDEPENDENT_AMBULATORY_CARE_PROVIDER_SITE_OTHER): Payer: Medicare Other | Admitting: Family Medicine

## 2018-01-18 VITALS — BP 130/70 | HR 52 | Temp 98.5°F | Ht 63.0 in | Wt 153.5 lb

## 2018-01-18 DIAGNOSIS — M7502 Adhesive capsulitis of left shoulder: Secondary | ICD-10-CM | POA: Diagnosis not present

## 2018-01-18 NOTE — Progress Notes (Signed)
Dr. Frederico Hamman T. Shikita Vaillancourt, MD, [facility] [place], [address] Phone: [phone] Fax: [phone]  01/18/2018  Patient: Alexandria Moreno, MRN: [id number], DOB: 1941/04/09, 77 y.o.  Primary Physician:  Ria Bush, MD   Chief Complaint  Patient presents with  . Follow-up    Left Shoulder   Subjective:   Ninoska Goswick is a 77 y.o. very pleasant female patient who presents with the following:  F/u frozen shoulder: Last time I saw the patient, she was having quite a bit of pain and some dulling in her progress with her rehab.  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  She has been compliant with her rehab program and making progress now.  ROM is improving  Past Medical History, Surgical History, Social History, Family History, Problem List, Medications, and Allergies have been reviewed and updated if relevant.  Patient Active Problem List   Diagnosis Date Noted  . Heartburn   . Esophageal dysphagia   . Colon cancer screening   . Benign neoplasm of cecum   . Polycythemia 10/15/2017  . Hoarseness 07/13/2017  . Left shoulder pain 07/13/2017  . 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 Post-viral cough syndrome 10/24/2016  . Advanced care planning/counseling discussion 06/30/2016  . Chronic left SI joint pain 01/08/2016  . Palpitation 11/19/2015  . Post-nasal drainage 11/19/2015  . Chronic migraine 05/18/2015  . Allergic rhinitis 03/19/2015  . 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 Atypical migraine 03/19/2015  . Dyslipidemia 03/19/2015  . GERD (gastroesophageal reflux disease) 03/19/2015  . H/O: rheumatic fever 03/19/2015  . H/O adenomatous polyp of colon 03/19/2015  . H/O surgical procedure 03/19/2015  . 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 Acne  erythematosa 03/19/2015  . 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 Stress incontinence, female 03/19/2015    Past Medical History:  Diagnosis Date  . Bilateral carotid bruits 2008   per GI note  . Cancer ([place])    skin  . Diverticulosis   . DJD (degenerative joint disease) of knee   . Frequent headaches   . GERD (gastroesophageal reflux disease)   . H/O transfusion of whole blood 2009  . H/O: GI bleed 2009   due to ASA  . H/O: rheumatic fever    77 years old  . Heart valve problem   . History of chicken pox   . Hyperlipidemia   . Migraines   . Palpitation    controlled with Toprol  . Pelvic prolapse    with rectocele  . Rosacea   . Seasonal allergic rhinitis    mild  . Stress incontinence     Past Surgical History:  Procedure Laterality Date  . ABDOMINAL HYSTERECTOMY  1981  . APPENDECTOMY  1960  . capsule endoscopy  2009   WNL  . CATARACT EXTRACTION Bilateral [id number]  . COLONOSCOPY  2009   diverticulosis ([place])  . COLONOSCOPY WITH PROPOFOL N/A 12/01/2017   TA, rpt 5 yrs ([place])  . DEXA  06/2016   T -0.2 hip, 0.7 spine  . ESOPHAGOGASTRODUODENOSCOPY  2009   focal mild chronic gastritis, neg H pylori ([place])  . ESOPHAGOGASTRODUODENOSCOPY (EGD) WITH PROPOFOL N/A 12/01/2017   Procedure: ESOPHAGOGASTRODUODENOSCOPY (EGD) WITH PROPOFOL;  Surgeon: Lucilla Lame, MD;  Location: [facility] ENDOSCOPY;  Service: Endoscopy;  Laterality:  N/A;  . INCONTINENCE SURGERY  2001   with mesh  . LIPOMA EXCISION  1971   right shoulder  . [place]  . OVARIAN CYST REMOVAL  1960  . SALPINGOOPHORECTOMY Left 2001  . TONSILLECTOMY  1958    Social History   Socioeconomic History  . Marital status: Married    Spouse name: Not on file  . Number of children: Not on file  . Years of education: Not on file  . Highest education level: Not on file  Social Needs  . Financial resource strain: Not on file  . Food insecurity - worry: Not on file  . Food  insecurity - inability: Not on file  . Transportation needs - medical: Not on file  . Transportation needs - non-medical: Not on file  Occupational History  . Not on file  Tobacco Use  . Smoking status: Former Smoker    Last attempt to quit: 12/15/1978    Years since quitting: 39.1  . Smokeless tobacco: Never Used  Substance and Sexual Activity  . Alcohol use: No    Alcohol/week: 0.0 oz    Comment: Rare  . Drug use: No  . Sexual activity: No  Other Topics Concern  . Not on file  Social History Narrative   Lives with husband    Occupation: retired, was Art therapist   Edu: some college   Activity: stationary bicycle   Diet: good water, following slim fast diet, good vegetables    Family History  Problem Relation Age of Onset  . Stroke Mother 95  . Cancer Mother        Cervical  . Depression Mother   . Dementia Mother        vascular  . Heart disease Father        CABG  . Alcohol abuse Father   . Osteoporosis Father   . Hypertension Brother   . Stroke Brother 57  . Stroke Paternal Grandfather   . Alzheimer's disease Maternal Grandfather   . Breast cancer Neg Hx     Allergies  Allergen Reactions  . Aspirin Other (See Comments)    GI bleed  . Baclofen Diarrhea  . Codeine Nausea And Vomiting  . Imitrex  [Sumatriptan]     Palpatations  . Influenza Vaccines Swelling  . Vicodin  [Hydrocodone-Acetaminophen] Nausea And Vomiting    Medication list reviewed and updated in full in [place].  GEN: No fevers, chills. Nontoxic. Primarily MSK c/o today. MSK: Detailed in the HPI GI: tolerating PO intake without difficulty Neuro: No numbness, parasthesias, or tingling associated. Otherwise the pertinent positives of the ROS are noted above.   Objective:   BP 130/70   Pulse (!) 52   Temp 98.5 F (36.9 C) (Oral)   Ht 5\' 3"  (1.6 m)   Wt 153 lb 8 oz (69.6 kg)   BMI 27.19 kg/m    GEN: WDWN, NAD, Non-toxic, Alert & Oriented x 3 HEENT: Atraumatic,  Normocephalic.  Ears and Nose: No external deformity. EXTR: No clubbing/cyanosis/edema NEURO: Normal gait.  PSYCH: Normally interactive. Conversant. Not depressed or anxious appearing.  Calm demeanor.   Shoulder: R and L Inspection: No muscle wasting or winging Ecchymosis/edema: neg  AC joint, scapula, clavicle: NT Cervical spine: NT, full ROM Spurling's: neg ABNORMAL SIDE TESTED: L UNLESS OTHERWISE NOTED, THE CONTRALATERAL SIDE HAS FULL RANGE OF MOTION. Abduction: 5/5, LIMITED TO 160 DEGREES Flexion: 5/5, LIMITED TO 160 DEGNO ROM  IR, lift-off:  5/5. TESTED AT 90 DEGREES OF ABDUCTION, LIMITED TO 25 DEGREES ER at neutral:  5/5, TESTED AT 90 DEGREES OF ABDUCTION, LIMITED TO 75 DEGREES AC crossover and compression: PAIN Drop Test: neg Empty Can: neg Supraspinatus insertion: NT Bicipital groove: NT ALL OTHER SPECIAL TESTING EQUIVOCAL GIVEN LOSS OF MOTION C5-T1 intact Sensation intact Grip 5/5    Radiology: No results found.  Assessment and Plan:   Adhesive capsulitis of left shoulder  She is starting to make some decent progress and is in the thawing range.  Continue home rehab.  Follow-up: Return in about 2 months (around 03/18/2018).  Signed,  Maud Deed. Emeree Mahler, MD   Allergies as of 01/18/2018      Reactions   Aspirin Other (See Comments)   GI bleed   Baclofen Diarrhea   Codeine Nausea And Vomiting   Imitrex  [sumatriptan]    Palpatations   Influenza Vaccines Swelling   Vicodin  [hydrocodone-acetaminophen] Nausea And Vomiting      Medication List        Accurate as of 01/18/18  1:59 PM. Always use your most recent med list.          atorvastatin 40 MG tablet Commonly known as:  LIPITOR Take 1 tablet (40 mg total) by mouth daily.   B COMPLEX VITAMINS PO Take by mouth daily.   Calcium-Vitamin D3 600-400 MG-UNIT Tabs Take 1 tablet by mouth daily.   chlorhexidine 0.12 % solution Commonly known as:  PERIDEX   cholecalciferol 1000 units tablet Commonly  known as:  VITAMIN D Take 1,000 Units by mouth daily.   cycloSPORINE 0.05 % ophthalmic emulsion Commonly known as:  RESTASIS Apply 0.05 drops to eye 2 (two) times daily.   dexlansoprazole 60 MG capsule Commonly known as:  DEXILANT Take 1 capsule (60 mg total) by mouth daily.   diphenoxylate-atropine 2.5-0.025 MG tablet Commonly known as:  LOMOTIL Take 1 tablet by mouth 4 (four) times daily as needed for diarrhea or loose stools.   docusate sodium 100 MG capsule Commonly known as:  COLACE Take 1 capsule by mouth daily.   FIBER THERAPY PO Take 3 tablets by mouth daily.   glucosamine-chondroitin 500-400 MG tablet Take 1 tablet by mouth 2 (two) times daily.   metoprolol succinate 25 MG 24 hr tablet Commonly known as:  TOPROL XL Take 1 tablet (25 mg total) by mouth daily.   MULTIPLE VITAMIN PO Take by mouth daily.   OMEGA-3 FATTY ACIDS PO Take 1,200 mg by mouth daily.   Probiotic Pack Take 0.5 packets by mouth daily.   rizatriptan 10 MG tablet Commonly known as:  MAXALT Take 10 mg by mouth daily as needed.   topiramate 100 MG tablet Commonly known as:  TOPAMAX Take 100 mg by mouth 2 (two) times daily.

## 2018-03-08 DIAGNOSIS — L718 Other rosacea: Secondary | ICD-10-CM | POA: Diagnosis not present

## 2018-03-08 DIAGNOSIS — Z85828 Personal history of other malignant neoplasm of skin: Secondary | ICD-10-CM | POA: Diagnosis not present

## 2018-03-08 DIAGNOSIS — D229 Melanocytic nevi, unspecified: Secondary | ICD-10-CM | POA: Diagnosis not present

## 2018-03-08 DIAGNOSIS — L570 Actinic keratosis: Secondary | ICD-10-CM | POA: Diagnosis not present

## 2018-03-08 DIAGNOSIS — L821 Other seborrheic keratosis: Secondary | ICD-10-CM | POA: Diagnosis not present

## 2018-03-08 DIAGNOSIS — L304 Erythema intertrigo: Secondary | ICD-10-CM | POA: Diagnosis not present

## 2018-03-15 ENCOUNTER — Telehealth: Payer: Self-pay | Admitting: Gastroenterology

## 2018-03-15 NOTE — Telephone Encounter (Signed)
Pt scheduled for a follow up appt to discuss issues with diarrhea after colonoscopy.

## 2018-03-15 NOTE — Telephone Encounter (Signed)
Pt is having some complications from colonoscopy she would like to discuss them with Ginger to discuss them with Dr. Truett Perna [phone]

## 2018-03-24 ENCOUNTER — Other Ambulatory Visit: Payer: Self-pay

## 2018-03-24 ENCOUNTER — Ambulatory Visit (INDEPENDENT_AMBULATORY_CARE_PROVIDER_SITE_OTHER): Payer: Medicare Other | Admitting: Family Medicine

## 2018-03-24 ENCOUNTER — Encounter: Payer: Self-pay | Admitting: Family Medicine

## 2018-03-24 VITALS — BP 120/60 | HR 59 | Temp 97.7°F | Ht 63.0 in | Wt 154.5 lb

## 2018-03-24 DIAGNOSIS — M7502 Adhesive capsulitis of left shoulder: Secondary | ICD-10-CM | POA: Diagnosis not present

## 2018-03-24 NOTE — Progress Notes (Signed)
Dr. Frederico Hamman T. Yarethzy Croak, MD, [facility] [place], [address] Phone: [phone] Fax: [phone]  03/24/2018  Patient: Alexandria Moreno, MRN: [id number], DOB: 1941/11/19, 77 y.o.  Primary Physician:  Ria Bush, MD   Chief Complaint  Patient presents with  . Follow-up    Left Shoulder   Subjective:   Justice Aguirre is a 77 y.o. very pleasant female patient who presents with the following:  ROM is still mostly the same, pain at is the worst thing. Trying to do some rehab. Every day, but not the whole program. Some frustration, but slow progress.   Past Medical History, Surgical History, Social History, Family History, Problem List, Medications, and Allergies have been reviewed and updated if relevant.  Patient Active Problem List   Diagnosis Date Noted  . Heartburn   . Esophageal dysphagia   . Colon cancer screening   . Benign neoplasm of cecum   . Polycythemia 10/15/2017  . Hoarseness 07/13/2017  . Left shoulder pain 07/13/2017  . 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 Post-viral cough syndrome 10/24/2016  . Advanced care planning/counseling discussion 06/30/2016  . Chronic left SI joint pain 01/08/2016  . Palpitation 11/19/2015  . Post-nasal drainage 11/19/2015  . Chronic migraine 05/18/2015  . Allergic rhinitis 03/19/2015  . 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 Atypical migraine 03/19/2015  . Dyslipidemia 03/19/2015  . GERD (gastroesophageal reflux disease) 03/19/2015  . H/O: rheumatic fever 03/19/2015  . H/O adenomatous polyp of colon 03/19/2015  . H/O surgical procedure 03/19/2015  . 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 Acne erythematosa 03/19/2015  . 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 Stress incontinence, female 03/19/2015    Past Medical History:  Diagnosis Date  . Bilateral carotid bruits 2008   per GI note  . Cancer ([place])    skin  . Diverticulosis     . DJD (degenerative joint disease) of knee   . Frequent headaches   . GERD (gastroesophageal reflux disease)   . H/O transfusion of whole blood 2009  . H/O: GI bleed 2009   due to ASA  . H/O: rheumatic fever    77 years old  . Heart valve problem   . History of chicken pox   . Hyperlipidemia   . Migraines   . Palpitation    controlled with Toprol  . Pelvic prolapse    with rectocele  . Rosacea   . Seasonal allergic rhinitis    mild  . Stress incontinence     Past Surgical History:  Procedure Laterality Date  . ABDOMINAL HYSTERECTOMY  1981  . APPENDECTOMY  1960  . capsule endoscopy  2009   WNL  . CATARACT EXTRACTION Bilateral [id number]  . COLONOSCOPY  2009   diverticulosis ([place])  . COLONOSCOPY WITH PROPOFOL N/A 12/01/2017   TA, rpt 5 yrs ([place])  . DEXA  06/2016   T -0.2 hip, 0.7 spine  . ESOPHAGOGASTRODUODENOSCOPY  2009   focal mild chronic gastritis, neg H pylori ([place])  . ESOPHAGOGASTRODUODENOSCOPY (EGD) WITH PROPOFOL N/A 12/01/2017   Procedure: ESOPHAGOGASTRODUODENOSCOPY (EGD) WITH PROPOFOL;  Surgeon: Lucilla Lame, MD;  Location: [facility] ENDOSCOPY;  Service: Endoscopy;  Laterality: N/A;  . INCONTINENCE SURGERY  2001   with mesh  . LIPOMA EXCISION  1971   right shoulder  . [place]  . OVARIAN CYST REMOVAL  1960  . SALPINGOOPHORECTOMY Left 2001  . TONSILLECTOMY  1958  Social History   Socioeconomic History  . Marital status: Married    Spouse name: Not on file  . Number of children: Not on file  . Years of education: Not on file  . Highest education level: Not on file  Occupational History  . Not on file  Social Needs  . Financial resource strain: Not on file  . Food insecurity:    Worry: Not on file    Inability: Not on file  . Transportation needs:    Medical: Not on file    Non-medical: Not on file  Tobacco Use  . Smoking status: Former Smoker    Last attempt to quit: 12/15/1978    Years since quitting: 39.2  . Smokeless  tobacco: Never Used  Substance and Sexual Activity  . Alcohol use: No    Alcohol/week: 0.0 oz    Comment: Rare  . Drug use: No  . Sexual activity: Never  Lifestyle  . Physical activity:    Days per week: Not on file    Minutes per session: Not on file  . Stress: Not on file  Relationships  . Social connections:    Talks on phone: Not on file    Gets together: Not on file    Attends religious service: Not on file    Active member of club or organization: Not on file    Attends meetings of clubs or organizations: Not on file    Relationship status: Not on file  . Intimate partner violence:    Fear of current or ex partner: Not on file    Emotionally abused: Not on file    Physically abused: Not on file    Forced sexual activity: Not on file  Other Topics Concern  . Not on file  Social History Narrative   Lives with husband    Occupation: retired, was Art therapist   Edu: some college   Activity: stationary bicycle   Diet: good water, following slim fast diet, good vegetables    Family History  Problem Relation Age of Onset  . Stroke Mother 86  . Cancer Mother        Cervical  . Depression Mother   . Dementia Mother        vascular  . Heart disease Father        CABG  . Alcohol abuse Father   . Osteoporosis Father   . Hypertension Brother   . Stroke Brother 66  . Stroke Paternal Grandfather   . Alzheimer's disease Maternal Grandfather   . Breast cancer Neg Hx     Allergies  Allergen Reactions  . Aspirin Other (See Comments)    GI bleed  . Baclofen Diarrhea  . Codeine Nausea And Vomiting  . Imitrex  [Sumatriptan]     Palpatations  . Influenza Vaccines Swelling  . Vicodin  [Hydrocodone-Acetaminophen] Nausea And Vomiting    Medication list reviewed and updated in full in [place].  GEN: No fevers, chills. Nontoxic. Primarily MSK c/o today. MSK: Detailed in the HPI GI: tolerating PO intake without difficulty Neuro: No numbness, parasthesias,  or tingling associated. Otherwise the pertinent positives of the ROS are noted above.   Objective:   BP 120/60   Pulse (!) 59   Temp 97.7 F (36.5 C) (Oral)   Ht 5\' 3"  (1.6 m)   Wt 154 lb 8 oz (70.1 kg)   BMI 27.37 kg/m    GEN: WDWN, NAD, Non-toxic, Alert & Oriented x 3 HEENT:  Atraumatic, Normocephalic.  Ears and Nose: No external deformity. EXTR: No clubbing/cyanosis/edema NEURO: Normal gait.  PSYCH: Normally interactive. Conversant. Not depressed or anxious appearing.  Calm demeanor.   Shoulder: R and L Inspection: No muscle wasting or winging Ecchymosis/edema: neg  AC joint, scapula, clavicle: NT Cervical spine: NT, full ROM Spurling's: neg ABNORMAL SIDE TESTED: L UNLESS OTHERWISE NOTED, THE CONTRALATERAL SIDE HAS FULL RANGE OF MOTION. Abduction: 5/5, LIMITED TO 165 DEGREES Flexion: 5/5, LIMITED TO 160 DEGNO ROM  IR, lift-off: 5/5. TESTED AT 90 DEGREES OF ABDUCTION, LIMITED TO 5 DEGREES ER at neutral:  5/5, TESTED AT 90 DEGREES OF ABDUCTION, LIMITED TO 80 DEGREES AC crossover and compression: PAIN Drop Test: neg Empty Can: neg Supraspinatus insertion: NT Bicipital groove: NT ALL OTHER SPECIAL TESTING EQUIVOCAL GIVEN LOSS OF MOTION C5-T1 intact Sensation intact Grip 5/5    Radiology: No results found.  Assessment and Plan:   Adhesive capsulitis of left shoulder  Continues to make slow progress  Follow-up: Return in about 3 months (around 06/23/2018).  Signed,  Maud Deed. Daeja Helderman, MD   Allergies as of 03/24/2018      Reactions   Aspirin Other (See Comments)   GI bleed   Baclofen Diarrhea   Codeine Nausea And Vomiting   Imitrex  [sumatriptan]    Palpatations   Influenza Vaccines Swelling   Vicodin  [hydrocodone-acetaminophen] Nausea And Vomiting      Medication List        Accurate as of 03/24/18 11:05 AM. Always use your most recent med list.          atorvastatin 40 MG tablet Commonly known as:  LIPITOR Take 1 tablet (40 mg total) by  mouth daily.   B COMPLEX VITAMINS PO Take by mouth daily.   Calcium-Vitamin D3 600-400 MG-UNIT Tabs Take 1 tablet by mouth daily.   chlorhexidine 0.12 % solution Commonly known as:  PERIDEX   cholecalciferol 1000 units tablet Commonly known as:  VITAMIN D Take 1,000 Units by mouth daily.   cycloSPORINE 0.05 % ophthalmic emulsion Commonly known as:  RESTASIS Apply 0.05 drops to eye 2 (two) times daily.   dexlansoprazole 60 MG capsule Commonly known as:  DEXILANT Take 1 capsule (60 mg total) by mouth daily.   diphenoxylate-atropine 2.5-0.025 MG tablet Commonly known as:  LOMOTIL Take 1 tablet by mouth 4 (four) times daily as needed for diarrhea or loose stools.   docusate sodium 100 MG capsule Commonly known as:  COLACE Take 1 capsule by mouth daily.   FIBER THERAPY PO Take 3 tablets by mouth daily.   glucosamine-chondroitin 500-400 MG tablet Take 1 tablet by mouth 2 (two) times daily.   metoprolol succinate 25 MG 24 hr tablet Commonly known as:  TOPROL XL Take 1 tablet (25 mg total) by mouth daily.   MULTIPLE VITAMIN PO Take by mouth daily.   OMEGA-3 FATTY ACIDS PO Take 1,200 mg by mouth daily.   Probiotic Pack Take 0.5 packets by mouth daily.   PROBIOTIC PO Take 1 tablet by mouth daily.   rizatriptan 10 MG tablet Commonly known as:  MAXALT Take 10 mg by mouth daily as needed.   topiramate 100 MG tablet Commonly known as:  TOPAMAX Take 100 mg by mouth 2 (two) times daily.

## 2018-04-01 ENCOUNTER — Ambulatory Visit (INDEPENDENT_AMBULATORY_CARE_PROVIDER_SITE_OTHER): Payer: Medicare Other | Admitting: Gastroenterology

## 2018-04-01 ENCOUNTER — Encounter: Payer: Self-pay | Admitting: Gastroenterology

## 2018-04-01 VITALS — BP 119/59 | HR 61 | Ht 63.0 in | Wt 155.0 lb

## 2018-04-01 DIAGNOSIS — K591 Functional diarrhea: Secondary | ICD-10-CM

## 2018-04-01 NOTE — Progress Notes (Signed)
Primary Care Physician: Ria Bush, MD  Primary Gastroenterologist:  Dr. Lucilla Lame  No chief complaint on file.   HPI: Alexandria Moreno is a 77 y.o. female here for follow-up after having a colonoscopy at the end of last year.  At that time the patient was found to have a tubular adenoma.  The patient was having the procedure for screening.  She now reports that she has been having loose bowel movements since the procedure.  She reports that the diarrhea actually started approximately 2 weeks after the procedure.  She denies any sick contacts.  She also reports that the diarrhea does not wake her up in the middle the night but is usually during the day.  There is no foods that she states make her to have more diarrhea than not.  The patient also reports that she can have some days with soft stools and not having diarrhea but also it is accompanied by small hard balls of stool.  Current Outpatient Medications  Medication Sig Dispense Refill  . atorvastatin (LIPITOR) 40 MG tablet Take 1 tablet (40 mg total) by mouth daily. 90 tablet 3  . B COMPLEX VITAMINS PO Take by mouth daily.    . Calcium Carb-Cholecalciferol (CALCIUM-VITAMIN D3) 600-400 MG-UNIT TABS Take 1 tablet by mouth daily.    . chlorhexidine (PERIDEX) 0.12 % solution   0  . cholecalciferol (VITAMIN D) 1000 units tablet Take 1,000 Units by mouth daily.    . cycloSPORINE (RESTASIS) 0.05 % ophthalmic emulsion Apply 0.05 drops to eye 2 (two) times daily.    Marland Kitchen dexlansoprazole (DEXILANT) 60 MG capsule Take 1 capsule (60 mg total) by mouth daily. 90 capsule 3  . diphenoxylate-atropine (LOMOTIL) 2.5-0.025 MG tablet Take 1 tablet by mouth 4 (four) times daily as needed for diarrhea or loose stools. 90 tablet 5  . docusate sodium (COLACE) 100 MG capsule Take 1 capsule by mouth daily.    Marland Kitchen glucosamine-chondroitin 500-400 MG tablet Take 1 tablet by mouth 2 (two) times daily.    . Methylcellulose, Laxative, (FIBER THERAPY PO) Take 3  tablets by mouth daily.    . metoprolol succinate (TOPROL XL) 25 MG 24 hr tablet Take 1 tablet (25 mg total) by mouth daily. 90 tablet 3  . MULTIPLE VITAMIN PO Take by mouth daily.    . OMEGA-3 FATTY ACIDS PO Take 1,200 mg by mouth daily.     . Probiotic PACK Take 0.5 packets by mouth daily.    . Probiotic Product (PROBIOTIC PO) Take 1 tablet by mouth daily.    . rizatriptan (MAXALT) 10 MG tablet Take 10 mg by mouth daily as needed.    . topiramate (TOPAMAX) 100 MG tablet Take 100 mg by mouth 2 (two) times daily.     No current facility-administered medications for this visit.     Allergies as of 04/01/2018 - Review Complete 03/24/2018  Allergen Reaction Noted  . Aspirin Other (See Comments) 03/19/2015  . Baclofen Diarrhea 06/27/2016  . Codeine Nausea And Vomiting 03/19/2015  . Imitrex  [sumatriptan]  03/19/2015  . Influenza vaccines Swelling 01/08/2016  . Vicodin  [hydrocodone-acetaminophen] Nausea And Vomiting 03/19/2015    ROS:  General: Negative for anorexia, weight loss, fever, chills, fatigue, weakness. ENT: Negative for hoarseness, difficulty swallowing , nasal congestion. CV: Negative for chest pain, angina, palpitations, dyspnea on exertion, peripheral edema.  Respiratory: Negative for dyspnea at rest, dyspnea on exertion, cough, sputum, wheezing.  GI: See history of present illness. GU:  Negative  for dysuria, hematuria, urinary incontinence, urinary frequency, nocturnal urination.  Endo: Negative for unusual weight change.    Physical Examination:   There were no vitals taken for this visit.  General: Well-nourished, well-developed in no acute distress.  Eyes: No icterus. Conjunctivae pink. Mouth: Oropharyngeal mucosa moist and pink , no lesions erythema or exudate. Lungs: Clear to auscultation bilaterally. Non-labored. Heart: Regular rate and rhythm, no murmurs rubs or gallops.  Abdomen: Bowel sounds are normal, nontender, nondistended, no hepatosplenomegaly or  masses, no abdominal bruits or hernia , no rebound or guarding.   Extremities: No lower extremity edema. No clubbing or deformities. Neuro: Alert and oriented x 3.  Grossly intact. Skin: Warm and dry, no jaundice.   Psych: Alert and cooperative, normal mood and affect.  Labs:    Imaging Studies: No results found.  Assessment and Plan:   Alexandria Moreno is a 77 y.o. y/o female who comes in with loose bowel movements and soft stools that started 2 weeks after having her colonoscopy.  The patient has tried some over-the-counter probiotics but has been given samples of VSL#3.  The patient has also been told to increase fiber in her diet to see if she can bulk up her stools.  She will take antidiarrheal medication as needed to keep her diarrhea under control.  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  The patient states she understands the plan and agrees with it.    Lucilla Lame, MD. [name]   Note: This dictation was prepared with Dragon dictation along with smaller phrase technology. Any transcriptional errors that result from this process are unintentional.

## 2018-04-05 ENCOUNTER — Other Ambulatory Visit: Payer: Self-pay | Admitting: Gastroenterology

## 2018-04-05 NOTE — Telephone Encounter (Signed)
*  STAT* If patient is at the pharmacy, call can be transferred to refill team.   1. Which medications need to be refilled? (please list name of each medication and dose if known) Lomotil 2.5  2. Which pharmacy/location (including street and city if local pharmacy) is medication to be sent to? [facility]  3. Do they need a 30 day or 90 day supply? 90 day

## 2018-04-12 ENCOUNTER — Other Ambulatory Visit: Payer: Self-pay

## 2018-04-12 MED ORDER — DIPHENOXYLATE-ATROPINE 2.5-0.025 MG PO TABS: 1.0000 | ORAL_TABLET | Freq: Four times a day (QID) | ORAL | 3 refills | Status: DC

## 2018-04-12 NOTE — Telephone Encounter (Signed)
Refill for Lomotil has been sent to [facility] per pt request.

## 2018-04-19 ENCOUNTER — Other Ambulatory Visit: Payer: Self-pay

## 2018-04-19 ENCOUNTER — Telehealth: Payer: Self-pay | Admitting: Gastroenterology

## 2018-04-19 MED ORDER — DIPHENOXYLATE-ATROPINE 2.5-0.025 MG PO TABS: 1.0000 | ORAL_TABLET | Freq: Four times a day (QID) | ORAL | 3 refills | Status: DC

## 2018-04-19 NOTE — Telephone Encounter (Signed)
[facility] calling to get auth to refill rx dioxalate cb [phone] ref# [id number]

## 2018-04-19 NOTE — Telephone Encounter (Signed)
[facility]ed [facility] to give verbal order for her Lomotil. Hard copy rx was sent as well.

## 2018-05-17 ENCOUNTER — Other Ambulatory Visit: Payer: Self-pay | Admitting: Family Medicine

## 2018-05-17 DIAGNOSIS — Z1231 Encounter for screening mammogram for malignant neoplasm of breast: Secondary | ICD-10-CM

## 2018-06-21 DIAGNOSIS — G43119 Migraine with aura, intractable, without status migrainosus: Secondary | ICD-10-CM | POA: Diagnosis not present

## 2018-06-21 DIAGNOSIS — R252 Cramp and spasm: Secondary | ICD-10-CM | POA: Diagnosis not present

## 2018-06-22 NOTE — Progress Notes (Signed)
Dr. Frederico Hamman T. Josedejesus Marcum, MD, [facility] [place], [address] Phone: [phone] Fax: [phone]  06/23/2018  Patient: Alexandria Moreno, MRN: [id number], DOB: 11/29/1941, 77 y.o.  Primary Physician:  Ria Bush, MD   Chief Complaint  Patient presents with  . Follow-up    Left Shoulder   Subjective:   Alexandria Moreno is a 77 y.o. very pleasant female patient who presents with the following:  Pleasant lady who is been symptomatic with a frozen shoulder on the left side now for approximately 13 months.  She is here today in follow-up to recheck on her progress.  Patient is here now on really is stalled out with her frozen shoulder progress.  She has still a significant amount of loss of motion and is really not having any progress for a number of months.  She has been going to physical therapy, she is been compliant with her home exercise program, and she remains frustrated.  I did do an MRI of her shoulder a number of months ago, with her lack of resolution, significant pain, rule out rotator cuff tear, and there was none.  Or signs of frozen shoulder.  IROM - still with some limitations.  Can reach up and  Cannot really reach in the dryer.  Adapted.   03/24/2018 Last OV with Owens Loffler, MD  ROM is still mostly the same, pain at is the worst thing. Trying to do some rehab. Every day, but not the whole program. Some frustration, but slow progress.   Past Medical History, Surgical History, Social History, Family History, Problem List, Medications, and Allergies have been reviewed and updated if relevant.  Patient Active Problem List   Diagnosis Date Noted  . Heartburn   . Esophageal dysphagia   . Colon cancer screening   . Benign neoplasm of cecum   . Polycythemia 10/15/2017  . Hoarseness 07/13/2017  . Left shoulder pain 07/13/2017  . 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 Post-viral cough syndrome  10/24/2016  . Advanced care planning/counseling discussion 06/30/2016  . Chronic left SI joint pain 01/08/2016  . Palpitation 11/19/2015  . Post-nasal drainage 11/19/2015  . Chronic migraine 05/18/2015  . Allergic rhinitis 03/19/2015  . 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 Atypical migraine 03/19/2015  . Dyslipidemia 03/19/2015  . GERD (gastroesophageal reflux disease) 03/19/2015  . H/O: rheumatic fever 03/19/2015  . H/O adenomatous polyp of colon 03/19/2015  . H/O surgical procedure 03/19/2015  . 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 Acne erythematosa 03/19/2015  . 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 Stress incontinence, female 03/19/2015    Past Medical History:  Diagnosis Date  . Bilateral carotid bruits 2008   per GI note  . Cancer ([place])    skin  . Diverticulosis   . DJD (degenerative joint disease) of knee   . Frequent headaches   . GERD (gastroesophageal reflux disease)   . H/O transfusion of whole blood 2009  . H/O: GI bleed 2009   due to ASA  . H/O: rheumatic fever    77 years old  . Heart valve problem   . History of chicken pox   . Hyperlipidemia   . Migraines   . Palpitation    controlled with Toprol  . Pelvic prolapse    with rectocele  . Rosacea   . Seasonal allergic rhinitis    mild  . Stress incontinence     Past Surgical History:  Procedure Laterality  Date  . ABDOMINAL HYSTERECTOMY  1981  . APPENDECTOMY  1960  . capsule endoscopy  2009   WNL  . CATARACT EXTRACTION Bilateral [id number]  . COLONOSCOPY  2009   diverticulosis ([place])  . COLONOSCOPY WITH PROPOFOL N/A 12/01/2017   TA, rpt 5 yrs ([place])  . DEXA  06/2016   T -0.2 hip, 0.7 spine  . ESOPHAGOGASTRODUODENOSCOPY  2009   focal mild chronic gastritis, neg H pylori ([place])  . ESOPHAGOGASTRODUODENOSCOPY (EGD) WITH PROPOFOL N/A 12/01/2017   Procedure: ESOPHAGOGASTRODUODENOSCOPY (EGD) WITH PROPOFOL;  Surgeon: Lucilla Lame, MD;  Location: [facility] ENDOSCOPY;   Service: Endoscopy;  Laterality: N/A;  . INCONTINENCE SURGERY  2001   with mesh  . LIPOMA EXCISION  1971   right shoulder  . [place]  . OVARIAN CYST REMOVAL  1960  . SALPINGOOPHORECTOMY Left 2001  . TONSILLECTOMY  1958    Social History   Socioeconomic History  . Marital status: Married    Spouse name: Not on file  . Number of children: Not on file  . Years of education: Not on file  . Highest education level: Not on file  Occupational History  . Not on file  Social Needs  . Financial resource strain: Not on file  . Food insecurity:    Worry: Not on file    Inability: Not on file  . Transportation needs:    Medical: Not on file    Non-medical: Not on file  Tobacco Use  . Smoking status: Former Smoker    Last attempt to quit: 12/15/1978    Years since quitting: 39.5  . Smokeless tobacco: Never Used  Substance and Sexual Activity  . Alcohol use: No    Alcohol/week: 0.0 oz    Comment: Rare  . Drug use: No  . Sexual activity: Never  Lifestyle  . Physical activity:    Days per week: Not on file    Minutes per session: Not on file  . Stress: Not on file  Relationships  . Social connections:    Talks on phone: Not on file    Gets together: Not on file    Attends religious service: Not on file    Active member of club or organization: Not on file    Attends meetings of clubs or organizations: Not on file    Relationship status: Not on file  . Intimate partner violence:    Fear of current or ex partner: Not on file    Emotionally abused: Not on file    Physically abused: Not on file    Forced sexual activity: Not on file  Other Topics Concern  . Not on file  Social History Narrative   Lives with husband    Occupation: retired, was Art therapist   Edu: some college   Activity: stationary bicycle   Diet: good water, following slim fast diet, good vegetables    Family History  Problem Relation Age of Onset  . Stroke Mother 76  . Cancer Mother         Cervical  . Depression Mother   . Dementia Mother        vascular  . Heart disease Father        CABG  . Alcohol abuse Father   . Osteoporosis Father   . Hypertension Brother   . Stroke Brother 79  . Stroke Paternal Grandfather   . Alzheimer's disease Maternal Grandfather   . Breast cancer Neg Hx  Allergies  Allergen Reactions  . Aspirin Other (See Comments)    GI bleed  . Baclofen Diarrhea  . Codeine Nausea And Vomiting  . Imitrex  [Sumatriptan]     Palpatations  . Influenza Vaccines Swelling  . Vicodin  [Hydrocodone-Acetaminophen] Nausea And Vomiting    Medication list reviewed and updated in full in [facility].  GEN: No fevers, chills. Nontoxic. Primarily MSK c/o today. MSK: Detailed in the HPI GI: tolerating PO intake without difficulty Neuro: No numbness, parasthesias, or tingling associated. Otherwise the pertinent positives of the ROS are noted above.   Objective:   BP 120/60   Pulse (!) 58   Temp 97.7 F (36.5 C) (Oral)   Ht 5\' 3"  (1.6 m)   Wt 154 lb 8 oz (70.1 kg)   BMI 27.37 kg/m    GEN: WDWN, NAD, Non-toxic, Alert & Oriented x 3 HEENT: Atraumatic, Normocephalic.  Ears and Nose: No external deformity. EXTR: No clubbing/cyanosis/edema NEURO: Normal gait.  PSYCH: Normally interactive. Conversant. Not depressed or anxious appearing.  Calm demeanor.   Shoulder: R and L Inspection: No muscle wasting or winging Ecchymosis/edema: neg  AC joint, scapula, clavicle: NT Cervical spine: NT, full ROM Spurling's: neg ABNORMAL SIDE TESTED: L UNLESS OTHERWISE NOTED, THE CONTRALATERAL SIDE HAS FULL RANGE OF MOTION. Abduction: 5/5, LIMITED TO 130 DEGREES Flexion: 5/5, LIMITED TO 135 DEGNO ROM  IR, lift-off: 5/5. TESTED AT 90 DEGREES OF ABDUCTION, LIMITED TO  5 DEGREES ER at neutral:  5/5, TESTED AT 90 DEGREES OF ABDUCTION, LIMITED TO 10 DEGREES AC crossover and compression: PAIN Drop Test: neg Empty Can: neg Supraspinatus insertion:  NT Bicipital groove: NT ALL OTHER SPECIAL TESTING EQUIVOCAL GIVEN LOSS OF MOTION C5-T1 intact Sensation intact Grip 5/5    Radiology: Mr Shoulder Left Wo Contrast  Result Date: 12/10/2017 CLINICAL DATA:  77 year old with left shoulder pain and decreased range of motion for 6 months. No acute injury or prior relevant surgery. EXAM: MRI OF THE LEFT SHOULDER WITHOUT CONTRAST TECHNIQUE: Multiplanar, multisequence MR imaging of the shoulder was performed. No intravenous contrast was administered. COMPARISON:  Radiographs 11/25/2017. FINDINGS: Despite efforts by the technologist and patient, mild motion artifact is present on today's exam and could not be eliminated. This reduces exam sensitivity and specificity. Motion is greatest on the axial images, despite repeating this series. Rotator cuff:  Intact without significant tendinosis. Muscles:  No focal muscular atrophy or edema. Biceps long head:  Intact and normally positioned. Acromioclavicular Joint: The acromion is type 2. There are mild acromioclavicular degenerative changes. Trace fluid in the subacromial -subdeltoid bursa. Glenohumeral Joint: No significant shoulder joint effusion or glenohumeral arthropathy. There is possible mild thickening and edema of the joint capsule in the axillary recess, as can be seen with adhesive capsulitis. Labrum:  No evidence of labral tear or paralabral cyst. Bones: No acute or significant extra-articular osseous findings. Other: No significant soft tissue findings. IMPRESSION: 1. Mild joint capsular thickening and edema as can be seen with adhesive capsulitis. 2. No evidence of rotator cuff, labral or biceps tendon tear. 3. Mild acromioclavicular degenerative changes. Electronically Signed   By: Richardean Sale M.D.   On: 12/10/2017 12:37   Assessment and Plan:   Adhesive capsulitis of left shoulder - Plan: Ambulatory referral to Orthopedic Surgery  At 13 months, she continues to do poorly, failed a genuine  attempt at conservative care. We discussed options, and I am going to get her to see Shoulder surgeon to discuss potential definitive management.  Follow-up: No follow-ups on file.  Orders Placed This Encounter  Procedures  . Ambulatory referral to Orthopedic Surgery    Signed,  Frederico Hamman T. Ikhlas Albo, MD   Allergies as of 06/23/2018      Reactions   Aspirin Other (See Comments)   GI bleed   Baclofen Diarrhea   Codeine Nausea And Vomiting   Imitrex  [sumatriptan]    Palpatations   Influenza Vaccines Swelling   Vicodin  [hydrocodone-acetaminophen] Nausea And Vomiting      Medication List        Accurate as of 06/23/18 11:59 PM. Always use your most recent med list.          atorvastatin 40 MG tablet Commonly known as:  LIPITOR Take 1 tablet (40 mg total) by mouth daily.   B COMPLEX VITAMINS PO Take by mouth daily.   Calcium-Vitamin D3 600-400 MG-UNIT Tabs Take 1 tablet by mouth daily.   chlorhexidine 0.12 % solution Commonly known as:  PERIDEX   cholecalciferol 1000 units tablet Commonly known as:  VITAMIN D Take 1,000 Units by mouth daily.   CITRACAL PO Take 1 tablet by mouth at bedtime.   cycloSPORINE 0.05 % ophthalmic emulsion Commonly known as:  RESTASIS Apply 0.05 drops to eye 2 (two) times daily.   dexlansoprazole 60 MG capsule Commonly known as:  DEXILANT Take 1 capsule (60 mg total) by mouth daily.   diphenoxylate-atropine 2.5-0.025 MG tablet Commonly known as:  LOMOTIL Take 1 tablet by mouth 4 (four) times daily.   docusate sodium 100 MG capsule Commonly known as:  COLACE Take 1 capsule by mouth daily.   FIBER THERAPY PO Take 3 tablets by mouth daily.   glucosamine-chondroitin 500-400 MG tablet Take 1 tablet by mouth 2 (two) times daily.   metoprolol succinate 25 MG 24 hr tablet Commonly known as:  TOPROL XL Take 1 tablet (25 mg total) by mouth daily.   MULTIPLE VITAMIN PO Take by mouth daily.   OMEGA-3 FATTY ACIDS PO Take 1,200 mg  by mouth daily.   Probiotic Pack Take 0.5 packets by mouth daily.   PROBIOTIC PO Take 1 tablet by mouth daily.   rizatriptan 10 MG tablet Commonly known as:  MAXALT Take 10 mg by mouth daily as needed.   topiramate 100 MG tablet Commonly known as:  TOPAMAX Take 100 mg by mouth 2 (two) times daily.

## 2018-06-23 ENCOUNTER — Encounter: Payer: Self-pay | Admitting: Family Medicine

## 2018-06-23 ENCOUNTER — Ambulatory Visit (INDEPENDENT_AMBULATORY_CARE_PROVIDER_SITE_OTHER): Payer: Medicare Other | Admitting: Family Medicine

## 2018-06-23 VITALS — BP 120/60 | HR 58 | Temp 97.7°F | Ht 63.0 in | Wt 154.5 lb

## 2018-06-23 DIAGNOSIS — M7502 Adhesive capsulitis of left shoulder: Secondary | ICD-10-CM

## 2018-06-23 NOTE — Patient Instructions (Signed)
REFERRALS TO SPECIALISTS, SPECIAL TESTS (MRI, CT, ULTRASOUNDS)  [facility] or  [facility] will help you. ASK CHECK-IN FOR HELP.  Specialist appointment times vary a great deal, based on their schedule / openings. -- Some specialists have very long wait times. (Example. Dermatology)    

## 2018-06-24 ENCOUNTER — Encounter: Payer: Self-pay | Admitting: Family Medicine

## 2018-07-01 ENCOUNTER — Ambulatory Visit
Admission: RE | Admit: 2018-07-01 | Discharge: 2018-07-01 | Disposition: A | Discharge status: Home / Self Care | Payer: Medicare Other | Source: Ambulatory Visit | Attending: Family Medicine | Admitting: Family Medicine

## 2018-07-01 DIAGNOSIS — Z1231 Encounter for screening mammogram for malignant neoplasm of breast: Secondary | ICD-10-CM | POA: Insufficient documentation

## 2018-07-01 LAB — HM MAMMOGRAPHY

## 2018-07-02 ENCOUNTER — Encounter: Payer: Self-pay | Admitting: Family Medicine

## 2018-07-05 ENCOUNTER — Other Ambulatory Visit: Payer: Self-pay | Admitting: Family Medicine

## 2018-07-05 DIAGNOSIS — E038 Other specified hypothyroidism: Secondary | ICD-10-CM

## 2018-07-05 DIAGNOSIS — E785 Hyperlipidemia, unspecified: Secondary | ICD-10-CM

## 2018-07-05 DIAGNOSIS — D751 Secondary polycythemia: Secondary | ICD-10-CM

## 2018-07-05 DIAGNOSIS — E039 Hypothyroidism, unspecified: Secondary | ICD-10-CM

## 2018-07-07 ENCOUNTER — Ambulatory Visit (INDEPENDENT_AMBULATORY_CARE_PROVIDER_SITE_OTHER): Payer: Medicare Other

## 2018-07-07 ENCOUNTER — Ambulatory Visit: Payer: Medicare Other

## 2018-07-07 VITALS — BP 124/70 | HR 62 | Temp 98.3°F | Ht 63.0 in | Wt 154.0 lb

## 2018-07-07 DIAGNOSIS — Z0000 Encounter for general adult medical examination without abnormal findings: Secondary | ICD-10-CM | POA: Diagnosis not present

## 2018-07-07 DIAGNOSIS — E039 Hypothyroidism, unspecified: Secondary | ICD-10-CM

## 2018-07-07 DIAGNOSIS — E785 Hyperlipidemia, unspecified: Secondary | ICD-10-CM

## 2018-07-07 DIAGNOSIS — D751 Secondary polycythemia: Secondary | ICD-10-CM | POA: Diagnosis not present

## 2018-07-07 DIAGNOSIS — E038 Other specified hypothyroidism: Secondary | ICD-10-CM

## 2018-07-07 LAB — CBC WITH DIFFERENTIAL/PLATELET
BASOS PCT: 0.9 % (ref 0.0–3.0)
Basophils Absolute: 0 10*3/uL (ref 0.0–0.1)
EOS PCT: 3.1 % (ref 0.0–5.0)
Eosinophils Absolute: 0.1 10*3/uL (ref 0.0–0.7)
HEMATOCRIT: 45.3 % (ref 36.0–46.0)
HEMOGLOBIN: 15.3 g/dL — AB (ref 12.0–15.0)
LYMPHS ABS: 1.5 10*3/uL (ref 0.7–4.0)
Lymphocytes Relative: 32.5 % (ref 12.0–46.0)
MCHC: 33.9 g/dL (ref 30.0–36.0)
MCV: 92.1 fl (ref 78.0–100.0)
MONO ABS: 0.2 10*3/uL (ref 0.1–1.0)
MONOS PCT: 4.8 % (ref 3.0–12.0)
Neutro Abs: 2.6 10*3/uL (ref 1.4–7.7)
Neutrophils Relative %: 58.7 % (ref 43.0–77.0)
Platelets: 156 10*3/uL (ref 150.0–400.0)
RBC: 4.92 Mil/uL (ref 3.87–5.11)
RDW: 13.2 % (ref 11.5–15.5)
WBC: 4.5 10*3/uL (ref 4.0–10.5)

## 2018-07-07 LAB — COMPREHENSIVE METABOLIC PANEL
ALBUMIN: 4.2 g/dL (ref 3.5–5.2)
ALT: 20 U/L (ref 0–35)
AST: 20 U/L (ref 0–37)
Alkaline Phosphatase: 87 U/L (ref 39–117)
BUN: 17 mg/dL (ref 6–23)
CALCIUM: 9.5 mg/dL (ref 8.4–10.5)
CO2: 24 mEq/L (ref 19–32)
CREATININE: 0.74 mg/dL (ref 0.40–1.20)
Chloride: 110 mEq/L (ref 96–112)
GFR: 80.85 mL/min (ref >60.00)
Glucose, Bld: 94 mg/dL (ref 70–99)
Potassium: 4.4 mEq/L (ref 3.5–5.1)
SODIUM: 141 meq/L (ref 135–145)
TOTAL PROTEIN: 6.9 g/dL (ref 6.0–8.3)
Total Bilirubin: 0.4 mg/dL (ref 0.2–1.2)

## 2018-07-07 LAB — LIPID PANEL
CHOLESTEROL: 152 mg/dL (ref 0–200)
HDL: 40.6 mg/dL (ref >39.00)
LDL Cholesterol: 74 mg/dL (ref 0–99)
NonHDL: 111.62
TRIGLYCERIDES: 187 mg/dL — AB (ref 0.0–149.0)
Total CHOL/HDL Ratio: 4
VLDL: 37.4 mg/dL (ref 0.0–40.0)

## 2018-07-07 LAB — T4, FREE: Free T4: 0.76 ng/dL (ref 0.60–1.60)

## 2018-07-07 LAB — TSH: TSH: 3.75 u[IU]/mL (ref 0.35–4.50)

## 2018-07-07 NOTE — Progress Notes (Signed)
PCP notes:   Health maintenance:   Tetanus vaccine - postpone/insurance  Abnormal screenings:   None  Patient concerns:   None  Nurse concerns:  None  Next PCP appt:   07/16/18 @ 1430

## 2018-07-07 NOTE — Progress Notes (Signed)
I reviewed health advisor's note, was available for consultation, and agree with documentation and plan.   Signed,  Da Authement T. Apolonio Cutting, MD  

## 2018-07-07 NOTE — Patient Instructions (Signed)
Alexandria Moreno , Thank you for taking time to come for your Medicare Wellness Visit. I appreciate your ongoing commitment to your health goals. Please review the following plan we discussed and let me know if I can assist you in the future.   These are the goals we discussed: Goals    . Increase physical activity     Starting 07/07/2018, I will continue to exercise for 15 minutes 3 days per week.        This is a list of the screening recommended for you and due dates:  Health Maintenance  Topic Date Due  . DTaP/Tdap/Td vaccine (1 - Tdap) 07/08/2019*  . Tetanus Vaccine  07/08/2019*  . Mammogram  07/02/2019  . Colon Cancer Screening  12/01/2022  . DEXA scan (bone density measurement)  Completed  . Pneumonia vaccines  Completed  *Topic was postponed. The date shown is not the original due date.   Preventive Care for Adults  A healthy lifestyle and preventive care can promote health and wellness. Preventive health guidelines for adults include the following key practices.  . A routine yearly physical is a good way to check with your health care provider about your health and preventive screening. It is a chance to share any concerns and updates on your health and to receive a thorough exam.  . Visit your dentist for a routine exam and preventive care every 6 months. Brush your teeth twice a day and floss once a day. Good oral hygiene prevents tooth decay and gum disease.  . The frequency of eye exams is based on your age, health, family medical history, use  of contact lenses, and other factors. Follow your health care provider's recommendations for frequency of eye exams.  . Eat a healthy diet. Foods like vegetables, fruits, whole grains, low-fat dairy products, and lean protein foods contain the nutrients you need without too many calories. Decrease your intake of foods high in solid fats, added sugars, and salt. Eat the right amount of calories for you. Get information about a proper  diet from your health care provider, if necessary.  . Regular physical exercise is one of the most important things you can do for your health. Most adults should get at least 150 minutes of moderate-intensity exercise (any activity that increases your heart rate and causes you to sweat) each week. In addition, most adults need muscle-strengthening exercises on 2 or more days a week.  Silver Sneakers may be a benefit available to you. To determine eligibility, you may visit the website: www.silversneakers.com or contact program at (770) 329-4737 Mon-Fri between 8AM-8PM.   . Maintain a healthy weight. The body mass index (BMI) is a screening tool to identify possible weight problems. It provides an estimate of body fat based on height and weight. Your health care provider can find your BMI and can help you achieve or maintain a healthy weight.   For adults 20 years and older: ? A BMI below 18.5 is considered underweight. ? A BMI of 18.5 to 24.9 is normal. ? A BMI of 25 to 29.9 is considered overweight. ? A BMI of 30 and above is considered obese.   . Maintain normal blood lipids and cholesterol levels by exercising and minimizing your intake of saturated fat. Eat a balanced diet with plenty of fruit and vegetables. Blood tests for lipids and cholesterol should begin at age 48 and be repeated every 5 years. If your lipid or cholesterol levels are high, you are over 50, or  you are at high risk for heart disease, you may need your cholesterol levels checked more frequently. Ongoing high lipid and cholesterol levels should be treated with medicines if diet and exercise are not working.  . If you smoke, find out from your health care provider how to quit. If you do not use tobacco, please do not start.  . If you choose to drink alcohol, please do not consume more than 2 drinks per day. One drink is considered to be 12 ounces (355 mL) of beer, 5 ounces (148 mL) of wine, or 1.5 ounces (44 mL) of  liquor.  . If you are 20-67 years old, ask your health care provider if you should take aspirin to prevent strokes.  . Use sunscreen. Apply sunscreen liberally and repeatedly throughout the day. You should seek shade when your shadow is shorter than you. Protect yourself by wearing long sleeves, pants, a wide-brimmed hat, and sunglasses year round, whenever you are outdoors.  . Once a month, do a whole body skin exam, using a mirror to look at the skin on your back. Tell your health care provider of new moles, moles that have irregular borders, moles that are larger than a pencil eraser, or moles that have changed in shape or color.

## 2018-07-07 NOTE — Progress Notes (Signed)
Subjective:   Alexandria Moreno is a 77 y.o. female who presents for Medicare Annual (Subsequent) preventive examination.  Review of Systems:  N/A Cardiac Risk Factors include: advanced age (>38men, >88 women);dyslipidemia;hypertension     Objective:     Vitals: BP 124/70 (BP Location: Right Arm, Patient Position: Sitting, Cuff Size: Normal)   Pulse 62   Temp 98.3 F (36.8 C) (Oral)   Ht 5\' 3"  (1.6 m) Comment: no shoes  Wt 154 lb (69.9 kg)   SpO2 97%   BMI 27.28 kg/m   Body mass index is 27.28 kg/m.  Advanced Directives 07/07/2018 12/01/2017 07/01/2017 05/22/2016 11/19/2015  Does Patient Have a Medical Advance Directive? Yes Yes Yes Yes Yes  Type of Paramedic of [place];Living will [place];Living will [place];Living will [address];Living will Living will  Does patient want to make changes to medical advance directive? - - - No - Patient declined -  Copy of [place] in Chart? No - copy requested No - copy requested Yes No - copy requested No - copy requested    Tobacco Social History   Tobacco Use  Smoking Status Former Smoker  . Last attempt to quit: 12/15/1978  . Years since quitting: 39.5  Smokeless Tobacco Never Used     Counseling given: No   Clinical Intake:  Pre-visit preparation completed: Yes  Pain Score: 3      Nutritional Status: BMI 25 -29 Overweight Nutritional Risks: None Diabetes: No  How often do you need to have someone help you when you read instructions, pamphlets, or other written materials from your doctor or pharmacy?: 1 - Never What is the last grade level you completed in school?: 12th grade + some college  Interpreter Needed?: No  Comments: pt lives with spouse Information entered by :: LPinson, LPN  Past Medical History:  Diagnosis Date  . Bilateral carotid bruits 2008   per GI note  . Cancer ([place])    skin  . Diverticulosis     . DJD (degenerative joint disease) of knee   . Frequent headaches   . GERD (gastroesophageal reflux disease)   . H/O transfusion of whole blood 2009  . H/O: GI bleed 2009   due to ASA  . H/O: rheumatic fever    77 years old  . Heart valve problem   . History of chicken pox   . Hyperlipidemia   . Migraines   . Palpitation    controlled with Toprol  . Pelvic prolapse    with rectocele  . Rosacea   . Seasonal allergic rhinitis    mild  . Stress incontinence    Past Surgical History:  Procedure Laterality Date  . ABDOMINAL HYSTERECTOMY  1981  . APPENDECTOMY  1960  . capsule endoscopy  2009   WNL  . CATARACT EXTRACTION Bilateral [id number]  . COLONOSCOPY  2009   diverticulosis ([place])  . COLONOSCOPY WITH PROPOFOL N/A 12/01/2017   TA, rpt 5 yrs ([place])  . DEXA  06/2016   T -0.2 hip, 0.7 spine  . ESOPHAGOGASTRODUODENOSCOPY  2009   focal mild chronic gastritis, neg H pylori ([place])  . ESOPHAGOGASTRODUODENOSCOPY (EGD) WITH PROPOFOL N/A 12/01/2017   Procedure: ESOPHAGOGASTRODUODENOSCOPY (EGD) WITH PROPOFOL;  Surgeon: Lucilla Lame, MD;  Location: [facility] ENDOSCOPY;  Service: Endoscopy;  Laterality: N/A;  . INCONTINENCE SURGERY  2001   with mesh  . LIPOMA EXCISION  1971   right shoulder  . MOHS  SURGERY     [facility]  . OVARIAN CYST REMOVAL  1960  . SALPINGOOPHORECTOMY Left 2001  . TONSILLECTOMY  1958   Family History  Problem Relation Age of Onset  . Stroke Mother 21  . Cancer Mother        Cervical  . Depression Mother   . Dementia Mother        vascular  . Heart disease Father        CABG  . Alcohol abuse Father   . Osteoporosis Father   . Hypertension Brother   . Stroke Brother 26  . Stroke Paternal Grandfather   . Alzheimer's disease Maternal Grandfather   . Breast cancer Neg Hx    Social History   Socioeconomic History  . Marital status: Married    Spouse name: Not on file  . Number of children: Not on file  . Years of education: Not on file  . Highest  education level: Not on file  Occupational History  . Not on file  Social Needs  . Financial resource strain: Not on file  . Food insecurity:    Worry: Not on file    Inability: Not on file  . Transportation needs:    Medical: Not on file    Non-medical: Not on file  Tobacco Use  . Smoking status: Former Smoker    Last attempt to quit: 12/15/1978    Years since quitting: 39.5  . Smokeless tobacco: Never Used  Substance and Sexual Activity  . Alcohol use: No    Alcohol/week: 0.0 oz    Comment: Rare  . Drug use: No  . Sexual activity: Never  Lifestyle  . Physical activity:    Days per week: Not on file    Minutes per session: Not on file  . Stress: Not on file  Relationships  . Social connections:    Talks on phone: Not on file    Gets together: Not on file    Attends religious service: Not on file    Active member of club or organization: Not on file    Attends meetings of clubs or organizations: Not on file    Relationship status: Not on file  Other Topics Concern  . Not on file  Social History Narrative   Lives with husband    Occupation: retired, was Art therapist   Edu: some college   Activity: stationary bicycle   Diet: good water, following slim fast diet, good vegetables    Outpatient Encounter Medications as of 07/07/2018  Medication Sig  . atorvastatin (LIPITOR) 40 MG tablet Take 1 tablet (40 mg total) by mouth daily.  . B COMPLEX VITAMINS PO Take by mouth daily.  . Calcium Carb-Cholecalciferol (CALCIUM-VITAMIN D3) 600-400 MG-UNIT TABS Take 1 tablet by mouth daily.  . Calcium Citrate (CITRACAL PO) Take 1 tablet by mouth at bedtime.  . chlorhexidine (PERIDEX) 0.12 % solution   . cholecalciferol (VITAMIN D) 1000 units tablet Take 1,000 Units by mouth daily.  . cycloSPORINE (RESTASIS) 0.05 % ophthalmic emulsion Apply 0.05 drops to eye 2 (two) times daily.  Marland Kitchen dexlansoprazole (DEXILANT) 60 MG capsule Take 1 capsule (60 mg total) by mouth daily.  .  diphenoxylate-atropine (LOMOTIL) 2.5-0.025 MG tablet Take 1 tablet by mouth 4 (four) times daily.  Marland Kitchen docusate sodium (COLACE) 100 MG capsule Take 1 capsule by mouth daily.  Marland Kitchen glucosamine-chondroitin 500-400 MG tablet Take 1 tablet by mouth 2 (two) times daily.  . Methylcellulose, Laxative, (FIBER THERAPY PO) Take 3  tablets by mouth daily.  . metoprolol succinate (TOPROL XL) 25 MG 24 hr tablet Take 1 tablet (25 mg total) by mouth daily.  . MULTIPLE VITAMIN PO Take by mouth daily.  . OMEGA-3 FATTY ACIDS PO Take 1,200 mg by mouth daily.   . Probiotic PACK Take 0.5 packets by mouth daily.  . Probiotic Product (PROBIOTIC PO) Take 1 tablet by mouth daily.  . rizatriptan (MAXALT) 10 MG tablet Take 10 mg by mouth daily as needed.  . topiramate (TOPAMAX) 100 MG tablet Take 100 mg by mouth 2 (two) times daily.   No facility-administered encounter medications on file as of 07/07/2018.     Activities of Daily Living In your present state of health, do you have any difficulty performing the following activities: 07/07/2018  Hearing? N  Vision? N  Difficulty concentrating or making decisions? Y  Walking or climbing stairs? N  Dressing or bathing? N  Doing errands, shopping? N  Preparing Food and eating ? N  Using the Toilet? N  In the past six months, have you accidently leaked urine? Y  Do you have problems with loss of bowel control? N  Managing your Medications? N  Managing your Finances? N  Housekeeping or managing your Housekeeping? N  Some recent data might be hidden    Patient Care Team: Ria Bush, MD as PCP - General (Family Medicine) Lucilla Lame, MD as Consulting Physician (Gastroenterology) Brendolyn Patty, MD as Consulting Physician (Dermatology) Vladimir Crofts, MD as Consulting Physician (Neurology) Roque Cash., MD as Consulting Physician (Obstetrics and Gynecology) Alison Stalling, OD (Optometry)    Assessment:   This is a routine wellness examination for Jesyca.    Hearing Screening   125Hz  250Hz  500Hz  1000Hz  2000Hz  3000Hz  4000Hz  6000Hz  8000Hz   Right ear:   40 40 40  40    Left ear:   40 40 40  40    Vision Screening Comments: Vision exam in August 2018 with Dr. Olean Ree    Exercise Activities and Dietary recommendations Current Exercise Habits: Home exercise routine, Type of exercise: Other - see comments(stationary bike ), Time (Minutes): 15, Frequency (Times/Week): 3, Weekly Exercise (Minutes/Week): 45, Intensity: Moderate, Exercise limited by: None identified  Goals    . Increase physical activity     Starting 07/07/2018, I will continue to exercise for 15 minutes 3 days per week.        Fall Risk Fall Risk  07/07/2018 07/01/2017 05/22/2016 11/19/2015 05/18/2015  Falls in the past year? No No No No No   Depression Screen PHQ 2/9 Scores 07/07/2018 07/01/2017 05/22/2016 11/19/2015  PHQ - 2 Score 0 0 0 0  PHQ- 9 Score 0 - - -     Cognitive Function MMSE - Mini Mental State Exam 07/07/2018 07/01/2017 05/22/2016  Orientation to time 5 5 5   Orientation to Place 5 5 5   Registration 3 3 3   Attention/ Calculation 0 0 0  Recall 3 3 3   Language- name 2 objects 0 0 0  Language- repeat 1 1 1   Language- follow 3 step command 3 3 3   Language- read & follow direction 0 0 0  Write a sentence 0 0 0  Copy design 0 0 0  Total score 20 20 20      PLEASE NOTE: A Mini-Cog screen was completed. Maximum score is 20. A value of 0 denotes this part of [place] MMSE was not completed or the patient failed this part of the Mini-Cog screening.   Mini-Cog Screening  Orientation to Time - Max 5 pts Orientation to Place - Max 5 pts Registration - Max 3 pts Recall - Max 3 pts Language Repeat - Max 1 pts Language Follow 3 Step Command - Max 3 pts     Immunization History  Administered Date(s) Administered  . Influenza-Unspecified 10/02/2014  . Pneumococcal Conjugate-13 10/02/2014  . Pneumococcal Polysaccharide-23 12/16/2007  . Td 05/15/2008  . Zoster 11/13/2013     Screening Tests Health Maintenance  Topic Date Due  . DTaP/Tdap/Td (1 - Tdap) 07/08/2019 (Originally 05/16/2008)  . TETANUS/TDAP  07/08/2019 (Originally 05/15/2018)  . MAMMOGRAM  07/02/2019  . COLONOSCOPY  12/01/2022  . DEXA SCAN  Completed  . PNA vac Low Risk Adult  Completed       Plan:     I have personally reviewed, addressed, and noted the following in the patient's chart:  A. Medical and social history B. Use of alcohol, tobacco or illicit drugs  C. Current medications and supplements D. Functional ability and status E.  Nutritional status F.  Physical activity G. Advance directives H. List of other physicians I.  Hospitalizations, surgeries, and ER visits in previous 12 months J.  [place] to include hearing, vision, cognitive, depression L. Referrals and appointments - none  In addition, I have reviewed and discussed with patient certain preventive protocols, quality metrics, and best practice recommendations. A written personalized care plan for preventive services as well as general preventive health recommendations were provided to patient.  See attached scanned questionnaire for additional information.   Signed,   Lindell Noe, MHA, BS, LPN Health Coach

## 2018-07-08 ENCOUNTER — Other Ambulatory Visit: Payer: Self-pay | Admitting: Family Medicine

## 2018-07-09 ENCOUNTER — Ambulatory Visit: Payer: Medicare Other | Admitting: Family Medicine

## 2018-07-09 DIAGNOSIS — M7502 Adhesive capsulitis of left shoulder: Secondary | ICD-10-CM | POA: Diagnosis not present

## 2018-07-16 ENCOUNTER — Encounter: Payer: Self-pay | Admitting: Family Medicine

## 2018-07-16 ENCOUNTER — Ambulatory Visit (INDEPENDENT_AMBULATORY_CARE_PROVIDER_SITE_OTHER): Payer: Medicare Other | Admitting: Family Medicine

## 2018-07-16 VITALS — BP 124/62 | HR 62 | Temp 97.8°F | Ht 63.0 in | Wt 154.2 lb

## 2018-07-16 DIAGNOSIS — M7502 Adhesive capsulitis of left shoulder: Secondary | ICD-10-CM

## 2018-07-16 DIAGNOSIS — E785 Hyperlipidemia, unspecified: Secondary | ICD-10-CM

## 2018-07-16 DIAGNOSIS — G43709 Chronic migraine without aura, not intractable, without status migrainosus: Secondary | ICD-10-CM

## 2018-07-16 DIAGNOSIS — Z7189 Other specified counseling: Secondary | ICD-10-CM | POA: Diagnosis not present

## 2018-07-16 DIAGNOSIS — E039 Hypothyroidism, unspecified: Secondary | ICD-10-CM | POA: Diagnosis not present

## 2018-07-16 DIAGNOSIS — K219 Gastro-esophageal reflux disease without esophagitis: Secondary | ICD-10-CM | POA: Diagnosis not present

## 2018-07-16 DIAGNOSIS — IMO0002 Reserved for concepts with insufficient information to code with codable children: Secondary | ICD-10-CM

## 2018-07-16 DIAGNOSIS — D751 Secondary polycythemia: Secondary | ICD-10-CM

## 2018-07-16 DIAGNOSIS — N816 Rectocele: Secondary | ICD-10-CM

## 2018-07-16 DIAGNOSIS — E038 Other specified hypothyroidism: Secondary | ICD-10-CM

## 2018-07-16 LAB — POC URINALSYSI DIPSTICK (AUTOMATED)
BILIRUBIN UA: NEGATIVE
Glucose, UA: NEGATIVE
Ketones, UA: NEGATIVE
Leukocytes, UA: NEGATIVE
Nitrite, UA: NEGATIVE
PH UA: 6 (ref 5.0–8.0)
PROTEIN UA: NEGATIVE
Spec Grav, UA: 1.025 (ref 1.010–1.025)
Urobilinogen, UA: 0.2 E.U./dL

## 2018-07-16 MED ORDER — ATORVASTATIN CALCIUM 40 MG PO TABS: 40.0000 mg | ORAL_TABLET | Freq: Every day | ORAL | 3 refills | Status: DC

## 2018-07-16 MED ORDER — METOPROLOL SUCCINATE ER 25 MG PO TB24: 25.0000 mg | ORAL_TABLET | Freq: Every day | ORAL | 3 refills | Status: DC

## 2018-07-16 NOTE — Patient Instructions (Addendum)
If interested, check with pharmacy about new 2 shot shingles series (shingrix).  Urinalysis today. You are doing well today Return as needed or in1 year for next wellness visit and follow up Health Maintenance, Female Adopting a healthy lifestyle and getting preventive care can go a long way to promote health and wellness. Talk with your health care provider about what schedule of regular examinations is right for you. This is a good chance for you to check in with your provider about disease prevention and staying healthy. In between checkups, there are plenty of things you can do on your own. Experts have done a lot of research about which lifestyle changes and preventive measures are most likely to keep you healthy. Ask your health care provider for more information. Weight and diet Eat a healthy diet  Be sure to include plenty of vegetables, fruits, low-fat dairy products, and lean protein.  Do not eat a lot of foods high in solid fats, added sugars, or salt.  Get regular exercise. This is one of the most important things you can do for your health. ? Most adults should exercise for at least 150 minutes each week. The exercise should increase your heart rate and make you sweat (moderate-intensity exercise). ? Most adults should also do strengthening exercises at least twice a week. This is in addition to the moderate-intensity exercise.  Maintain a healthy weight  Body mass index (BMI) is a measurement that can be used to identify possible weight problems. It estimates body fat based on height and weight. Your health care provider can help determine your BMI and help you achieve or maintain a healthy weight.  For females 72 years of age and older: ? A BMI below 18.5 is considered underweight. ? A BMI of 18.5 to 24.9 is normal. ? A BMI of 25 to 29.9 is considered overweight. ? A BMI of 30 and above is considered obese.  Watch levels of cholesterol and blood lipids  You should start  having your blood tested for lipids and cholesterol at 77 years of age, then have this test every 5 years.  You may need to have your cholesterol levels checked more often if: ? Your lipid or cholesterol levels are high. ? You are older than 77 years of age. ? You are at high risk for heart disease.  Cancer screening Lung Cancer  Lung cancer screening is recommended for adults 65-36 years old who are at high risk for lung cancer because of a history of smoking.  A yearly low-dose CT scan of the lungs is recommended for people who: ? Currently smoke. ? Have quit within the past 15 years. ? Have at least a 30-pack-year history of smoking. A pack year is smoking an average of one pack of cigarettes a day for 1 year.  Yearly screening should continue until it has been 15 years since you quit.  Yearly screening should stop if you develop a health problem that would prevent you from having lung cancer treatment.  Breast Cancer  Practice breast self-awareness. This means understanding how your breasts normally appear and feel.  It also means doing regular breast self-exams. Let your health care provider know about any changes, no matter how small.  If you are in your 20s or 30s, you should have a clinical breast exam (CBE) by a health care provider every 1-3 years as part of a regular health exam.  If you are 41 or older, have a CBE every year. Also consider  having a breast X-ray (mammogram) every year.  If you have a family history of breast cancer, talk to your health care provider about genetic screening.  If you are at high risk for breast cancer, talk to your health care provider about having an MRI and a mammogram every year.  Breast cancer gene (BRCA) assessment is recommended for women who have family members with BRCA-related cancers. BRCA-related cancers include: ? Breast. ? Ovarian. ? Tubal. ? Peritoneal cancers.  Results of the assessment will determine the need for  genetic counseling and BRCA1 and BRCA2 testing.  Cervical Cancer Your health care provider may recommend that you be screened regularly for cancer of the pelvic organs (ovaries, uterus, and vagina). This screening involves a pelvic examination, including checking for microscopic changes to the surface of your cervix (Pap test). You may be encouraged to have this screening done every 3 years, beginning at age 48.  For women ages 22-65, health care providers may recommend pelvic exams and Pap testing every 3 years, or they may recommend the Pap and pelvic exam, combined with testing for human papilloma virus (HPV), every 5 years. Some types of HPV increase your risk of cervical cancer. Testing for HPV may also be done on women of any age with unclear Pap test results.  Other health care providers may not recommend any screening for nonpregnant women who are considered low risk for pelvic cancer and who do not have symptoms. Ask your health care provider if a screening pelvic exam is right for you.  If you have had past treatment for cervical cancer or a condition that could lead to cancer, you need Pap tests and screening for cancer for at least 20 years after your treatment. If Pap tests have been discontinued, your risk factors (such as having a new sexual partner) need to be reassessed to determine if screening should resume. Some women have medical problems that increase the chance of getting cervical cancer. In these cases, your health care provider may recommend more frequent screening and Pap tests.  Colorectal Cancer  This type of cancer can be detected and often prevented.  Routine colorectal cancer screening usually begins at 77 years of age and continues through 77 years of age.  Your health care provider may recommend screening at an earlier age if you have risk factors for colon cancer.  Your health care provider may also recommend using home test kits to check for hidden blood in the  stool.  A small camera at the end of a tube can be used to examine your colon directly (sigmoidoscopy or colonoscopy). This is done to check for the earliest forms of colorectal cancer.  Routine screening usually begins at age 65.  Direct examination of the colon should be repeated every 5-10 years through 77 years of age. However, you may need to be screened more often if early forms of precancerous polyps or small growths are found.  Skin Cancer  Check your skin from head to toe regularly.  Tell your health care provider about any new moles or changes in moles, especially if there is a change in a mole's shape or color.  Also tell your health care provider if you have a mole that is larger than the size of a pencil eraser.  Always use sunscreen. Apply sunscreen liberally and repeatedly throughout the day.  Protect yourself by wearing long sleeves, pants, a wide-brimmed hat, and sunglasses whenever you are outside.  Heart disease, diabetes, and high blood pressure  High blood pressure causes heart disease and increases the risk of stroke. High blood pressure is more likely to develop in: ? People who have blood pressure in the high end of the normal range (130-139/85-89 mm Hg). ? People who are overweight or obese. ? People who are African American.  If you are 14-51 years of age, have your blood pressure checked every 3-5 years. If you are 50 years of age or older, have your blood pressure checked every year. You should have your blood pressure measured twice-once when you are at a hospital or clinic, and once when you are not at a hospital or clinic. Record the average of the two measurements. To check your blood pressure when you are not at a hospital or clinic, you can use: ? An automated blood pressure machine at a pharmacy. ? A home blood pressure monitor.  If you are between 6 years and 19 years old, ask your health care provider if you should take aspirin to prevent  strokes.  Have regular diabetes screenings. This involves taking a blood sample to check your fasting blood sugar level. ? If you are at a normal weight and have a low risk for diabetes, have this test once every three years after 77 years of age. ? If you are overweight and have a high risk for diabetes, consider being tested at a younger age or more often. Preventing infection Hepatitis B  If you have a higher risk for hepatitis B, you should be screened for this virus. You are considered at high risk for hepatitis B if: ? You were born in a country where hepatitis B is common. Ask your health care provider which countries are considered high risk. ? Your parents were born in a high-risk country, and you have not been immunized against hepatitis B (hepatitis B vaccine). ? You have HIV or AIDS. ? You use needles to inject street drugs. ? You live with someone who has hepatitis B. ? You have had sex with someone who has hepatitis B. ? You get hemodialysis treatment. ? You take certain medicines for conditions, including cancer, organ transplantation, and autoimmune conditions.  Hepatitis C  Blood testing is recommended for: ? Everyone born from [birth year] through 1965. ? Anyone with known risk factors for hepatitis C.  Sexually transmitted infections (STIs)  You should be screened for sexually transmitted infections (STIs) including gonorrhea and chlamydia if: ? You are sexually active and are younger than 77 years of age. ? You are older than 77 years of age and your health care provider tells you that you are at risk for this type of infection. ? Your sexual activity has changed since you were last screened and you are at an increased risk for chlamydia or gonorrhea. Ask your health care provider if you are at risk.  If you do not have HIV, but are at risk, it may be recommended that you take a prescription medicine daily to prevent HIV infection. This is called pre-exposure prophylaxis  (PrEP). You are considered at risk if: ? You are sexually active and do not regularly use condoms or know the HIV status of your partner(s). ? You take drugs by injection. ? You are sexually active with a partner who has HIV.  Talk with your health care provider about whether you are at high risk of being infected with HIV. If you choose to begin PrEP, you should first be tested for HIV. You should then be tested every 3 months  for as long as you are taking PrEP. Pregnancy  If you are premenopausal and you may become pregnant, ask your health care provider about preconception counseling.  If you may become pregnant, take 400 to 800 micrograms (mcg) of folic acid every day.  If you want to prevent pregnancy, talk to your health care provider about birth control (contraception). Osteoporosis and menopause  Osteoporosis is a disease in which the bones lose minerals and strength with aging. This can result in serious bone fractures. Your risk for osteoporosis can be identified using a bone density scan.  If you are 8 years of age or older, or if you are at risk for osteoporosis and fractures, ask your health care provider if you should be screened.  Ask your health care provider whether you should take a calcium or vitamin D supplement to lower your risk for osteoporosis.  Menopause may have certain physical symptoms and risks.  Hormone replacement therapy may reduce some of these symptoms and risks. Talk to your health care provider about whether hormone replacement therapy is right for you. Follow these instructions at home:  Schedule regular health, dental, and eye exams.  Stay current with your immunizations.  Do not use any tobacco products including cigarettes, chewing tobacco, or electronic cigarettes.  If you are pregnant, do not drink alcohol.  If you are breastfeeding, limit how much and how often you drink alcohol.  Limit alcohol intake to no more than 1 drink per day for  nonpregnant women. One drink equals 12 ounces of beer, 5 ounces of wine, or 1 ounces of hard liquor.  Do not use street drugs.  Do not share needles.  Ask your health care provider for help if you need support or information about quitting drugs.  Tell your health care provider if you often feel depressed.  Tell your health care provider if you have ever been abused or do not feel safe at home. This information is not intended to replace advice given to you by your health care provider. Make sure you discuss any questions you have with your health care provider. Document Released: 06/16/2011 Document Revised: 05/08/2016 Document Reviewed: 09/04/2015 Elsevier Interactive Patient Education  [employer].

## 2018-07-16 NOTE — Progress Notes (Signed)
BP 124/62 (BP Location: Left Arm, Patient Position: Sitting, Cuff Size: Normal)   Pulse 62   Temp 97.8 F (36.6 C) (Oral)   Ht 5\' 3"  (1.6 m)   Wt 154 lb 4 oz (70 kg)   SpO2 97%   BMI 27.32 kg/m    CC: AMW f/u visit Subjective:    Patient ID: Alexandria Moreno, female    DOB: 03/24/1941, 77 y.o.   MRN: [id number]  HPI: Alexandria Moreno is a 77 y.o. female presenting on 07/16/2018 for Annual Exam (Pt 2.)   Saw Katha Cabal last week for medicare wellness visit. Note reviewed.    Sees neurology for migraines, on topamax 100mg  bid with triptan abortively. Considering amiovig.   Seeing sports medicine for chronic L shoulder adhesive capsulitis - referred to ortho for definitive management - Dr Lisette Grinder upcoming fluoroscopy guided shoulder steroid injection.  Colonoscopy [facility]) - developed diarrhea ?IBS after colonoscopy procedure. Started on probiotic and lomotil. Slowly recovering  Fungal infection around hysterectomy scar - TCI cream burned so she stopped this. Instead using lotrimin and antifungal powder with benefit.   Preventative: [facility]. Known h/o rectocele. S/p hysterectomy and ovaries removed for heavy bleeding (1981) COLONOSCOPY/capsule endoscopy Date: 2009 diverticulosis ([facility])  COLONOSCOPY WITH PROPOFOL 12/01/2017 TA, rpt 5 yrs ([facility]) Mammogram WNL 06/2018 Lung cancer screening - not eligible DEXA Date: 06/2016 T -0.2 hip, 0.7 spine Flu ALLERGY prevnar 2015, pneumovax 2009 Td 2009 zostavax 2014 shingrix - discussed Advanced directives - scanned 07/2016. No life prolonging measures if terminal irreversible condition. Separate HCPOA form filled out - Bruce husband then [employer] are [employer]. Scanned 08/2017. Seat belt use discussed.  Sunscreen use discussed. No changing moles on skin. Ex smoker - quit 1980. Alcohol - rare Dentist Q6 mo Eye exam - yearly  Lives with husband  Occupation: retired, was Art therapist Edu:  some college Activity: stationary bicycle  Diet: good water, following slim fast diet, good fruits/vegetables   Relevant past medical, surgical, family and social history reviewed and updated as indicated. Interim medical history since our last visit reviewed. Allergies and medications reviewed and updated. Outpatient Medications Prior to Visit  Medication Sig Dispense Refill  . B COMPLEX VITAMINS PO Take by mouth daily.    . Calcium Carb-Cholecalciferol (CALCIUM-VITAMIN D3) 600-400 MG-UNIT TABS Take 1 tablet by mouth daily.    . Calcium Citrate (CITRACAL PO) Take 1 tablet by mouth at bedtime.    . chlorhexidine (PERIDEX) 0.12 % solution   0  . cholecalciferol (VITAMIN D) 1000 units tablet Take 1,000 Units by mouth daily.    . cycloSPORINE (RESTASIS) 0.05 % ophthalmic emulsion Apply 0.05 drops to eye 2 (two) times daily.    Marland Kitchen dexlansoprazole (DEXILANT) 60 MG capsule Take 1 capsule (60 mg total) by mouth daily. 90 capsule 3  . diphenoxylate-atropine (LOMOTIL) 2.5-0.025 MG tablet Take 1 tablet by mouth 4 (four) times daily. 360 tablet 3  . docusate sodium (COLACE) 100 MG capsule Take 1 capsule by mouth daily.    Marland Kitchen glucosamine-chondroitin 500-400 MG tablet Take 1 tablet by mouth 2 (two) times daily.    . Methylcellulose, Laxative, (FIBER THERAPY PO) Take 3 tablets by mouth daily.    . MULTIPLE VITAMIN PO Take by mouth daily.    . OMEGA-3 FATTY ACIDS PO Take 1,200 mg by mouth daily.     . Probiotic PACK Take 0.5 packets by mouth daily.    . Probiotic Product (PROBIOTIC PO) Take  1 tablet by mouth daily.    . rizatriptan (MAXALT) 10 MG tablet Take 10 mg by mouth daily as needed.    . topiramate (TOPAMAX) 100 MG tablet Take 100 mg by mouth 2 (two) times daily.    Marland Kitchen atorvastatin (LIPITOR) 40 MG tablet TAKE 1 TABLET DAILY 90 tablet 0  . metoprolol succinate (TOPROL XL) 25 MG 24 hr tablet Take 1 tablet (25 mg total) by mouth daily. 90 tablet 3   No facility-administered medications prior to visit.       Per HPI unless specifically indicated in ROS section below Review of Systems     Objective:    BP 124/62 (BP Location: Left Arm, Patient Position: Sitting, Cuff Size: Normal)   Pulse 62   Temp 97.8 F (36.6 C) (Oral)   Ht 5\' 3"  (1.6 m)   Wt 154 lb 4 oz (70 kg)   SpO2 97%   BMI 27.32 kg/m   Wt Readings from Last 3 Encounters:  07/16/18 154 lb 4 oz (70 kg)  07/07/18 154 lb (69.9 kg)  06/23/18 154 lb 8 oz (70.1 kg)    Physical Exam  Constitutional: She is oriented to person, place, and time. She appears well-developed and well-nourished. No distress.  HENT:  Head: Normocephalic and atraumatic.  Right Ear: Hearing, tympanic membrane, external ear and ear canal normal.  Left Ear: Hearing, tympanic membrane, external ear and ear canal normal.  Nose: Nose normal.  Mouth/Throat: Uvula is midline, oropharynx is clear and moist and mucous membranes are normal. No oropharyngeal exudate, posterior oropharyngeal edema or posterior oropharyngeal erythema.  Eyes: Pupils are equal, round, and reactive to light. Conjunctivae and EOM are normal. No scleral icterus.  Neck: Normal range of motion. Neck supple. Carotid bruit is not present.  Cardiovascular: Normal rate, regular rhythm, normal heart sounds and intact distal pulses.  No murmur heard. Pulses:      Radial pulses are 2+ on the right side, and 2+ on the left side.  Pulmonary/Chest: Effort normal and breath sounds normal. No respiratory distress. She has no wheezes. She has no rales.  Abdominal: Soft. Bowel sounds are normal. She exhibits no distension and no mass. There is no tenderness. There is no rebound and no guarding.  Musculoskeletal: Normal range of motion. She exhibits no edema.  Lymphadenopathy:    She has no cervical adenopathy.  Neurological: She is alert and oriented to person, place, and time.  CN grossly intact, station and gait intact  Skin: Skin is warm and dry. No rash noted.  Psychiatric: She has a normal  mood and affect. Her behavior is normal. Judgment and thought content normal.  Nursing note and vitals reviewed.  Results for orders placed or performed in visit on 07/16/18  POCT Urinalysis Dipstick (Automated)  Result Value Ref Range   Color, UA yellow    Clarity, UA clear    Glucose, UA Negative Negative   Bilirubin, UA negative    Ketones, UA negative    Spec Grav, UA 1.025 1.010 - 1.025   Blood, UA +/-    pH, UA 6.0 5.0 - 8.0   Protein, UA Negative Negative   Urobilinogen, UA 0.2 0.2 or 1.0 E.U./dL   Nitrite, UA negative    Leukocytes, UA Negative Negative      Assessment & Plan:   Problem List Items Addressed This Visit    Subclinical hypothyroidism    TFTs stable.       Relevant Medications   metoprolol succinate (  TOPROL XL) 25 MG 24 hr tablet   Polycythemia - Primary    Mild, continue routine monitoring. Check UA - no significant hematuria.       Relevant Orders   POCT Urinalysis Dipstick (Automated) (Completed)   GERD (gastroesophageal reflux disease)    Chronic, stable. Continue dexilant.      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 Has stopped seeing GYN.       Dyslipidemia    Chronic, stable. Continue atorvastatin.  The 10-year ASCVD risk score Mikey Bussing DC Brooke Bonito., et al., 2013) is: 18.1%   Values used to calculate the score:     Age: 62 years     Sex: Female     Is Non-Hispanic African American: No     Diabetic: No     Tobacco smoker: No     Systolic Blood Pressure: 096 mmHg     Is BP treated: No     HDL Cholesterol: 40.6 mg/dL     Total Cholesterol: 152 mg/dL       Relevant Medications   atorvastatin (LIPITOR) 40 MG tablet   Chronic migraine (Chronic)    Sees neurology Dr Manuella Ghazi.      Relevant Medications   metoprolol succinate (TOPROL XL) 25 MG 24 hr tablet   atorvastatin (LIPITOR) 40 MG tablet   Advanced care planning/counseling discussion    Advanced directives - scanned 07/2016. No life prolonging measures if terminal  irreversible condition. Separate HCPOA form filled out - Bruce husband then [employer] are [employer]. Scanned 08/2017      Adhesive capsulitis of left shoulder    Appreciate SM and ortho care.          Meds ordered this encounter  Medications  . metoprolol succinate (TOPROL XL) 25 MG 24 hr tablet    Sig: Take 1 tablet (25 mg total) by mouth daily.    Dispense:  90 tablet    Refill:  3  . atorvastatin (LIPITOR) 40 MG tablet    Sig: Take 1 tablet (40 mg total) by mouth daily.    Dispense:  90 tablet    Refill:  3   Orders Placed This Encounter  Procedures  . POCT Urinalysis Dipstick (Automated)    Follow up plan: Return in about 1 year (around 07/17/2019) for follow up visit, medicare wellness visit.  Ria Bush, MD

## 2018-07-17 NOTE — Assessment & Plan Note (Signed)
Chronic, stable. Continue atorvastatin.  The 10-year ASCVD risk score Alexandria Moreno Alexandria Moreno., et al., 2013) is: 18.1%   Values used to calculate the score:     Age: 77 years     Sex: Female     Is Non-Hispanic African American: No     Diabetic: No     Tobacco smoker: No     Systolic Blood Pressure: 588 mmHg     Is BP treated: No     HDL Cholesterol: 40.6 mg/dL     Total Cholesterol: 152 mg/dL

## 2018-07-17 NOTE — Assessment & Plan Note (Signed)
Stable period. Has stopped seeing GYN.

## 2018-07-17 NOTE — Assessment & Plan Note (Signed)
Chronic, stable. Continue dexilant.

## 2018-07-17 NOTE — Assessment & Plan Note (Signed)
TFTs stable.

## 2018-07-17 NOTE — Assessment & Plan Note (Signed)
Advanced directives - scanned 07/2016. No life prolonging measures if terminal irreversible condition. Separate HCPOA form filled out - Bruce husband then [employer] are [employer]. Scanned 08/2017

## 2018-07-17 NOTE — Assessment & Plan Note (Signed)
Appreciate SM and ortho care.

## 2018-07-17 NOTE — Assessment & Plan Note (Signed)
Sees neurology Dr Manuella Ghazi.

## 2018-07-17 NOTE — Assessment & Plan Note (Signed)
Mild, continue routine monitoring. Check UA - no significant hematuria.

## 2018-07-28 DIAGNOSIS — M19012 Primary osteoarthritis, left shoulder: Secondary | ICD-10-CM | POA: Diagnosis not present

## 2018-08-04 DIAGNOSIS — M7502 Adhesive capsulitis of left shoulder: Secondary | ICD-10-CM | POA: Diagnosis not present

## 2018-08-11 DIAGNOSIS — M25512 Pain in left shoulder: Secondary | ICD-10-CM | POA: Diagnosis not present

## 2018-08-11 DIAGNOSIS — M25612 Stiffness of left shoulder, not elsewhere classified: Secondary | ICD-10-CM | POA: Diagnosis not present

## 2018-08-18 DIAGNOSIS — M25612 Stiffness of left shoulder, not elsewhere classified: Secondary | ICD-10-CM | POA: Diagnosis not present

## 2018-08-18 DIAGNOSIS — M25512 Pain in left shoulder: Secondary | ICD-10-CM | POA: Diagnosis not present

## 2018-08-24 DIAGNOSIS — Z961 Presence of intraocular lens: Secondary | ICD-10-CM | POA: Diagnosis not present

## 2018-08-24 DIAGNOSIS — D3131 Benign neoplasm of right choroid: Secondary | ICD-10-CM | POA: Diagnosis not present

## 2018-08-24 DIAGNOSIS — H524 Presbyopia: Secondary | ICD-10-CM | POA: Diagnosis not present

## 2018-08-24 DIAGNOSIS — H04123 Dry eye syndrome of bilateral lacrimal glands: Secondary | ICD-10-CM | POA: Diagnosis not present

## 2018-09-01 DIAGNOSIS — M5416 Radiculopathy, lumbar region: Secondary | ICD-10-CM | POA: Diagnosis not present

## 2018-09-01 DIAGNOSIS — M25612 Stiffness of left shoulder, not elsewhere classified: Secondary | ICD-10-CM | POA: Diagnosis not present

## 2018-09-21 DIAGNOSIS — L820 Inflamed seborrheic keratosis: Secondary | ICD-10-CM | POA: Diagnosis not present

## 2018-09-21 DIAGNOSIS — D0439 Carcinoma in situ of skin of other parts of face: Secondary | ICD-10-CM | POA: Diagnosis not present

## 2018-09-21 DIAGNOSIS — Z85828 Personal history of other malignant neoplasm of skin: Secondary | ICD-10-CM | POA: Diagnosis not present

## 2018-09-21 DIAGNOSIS — L821 Other seborrheic keratosis: Secondary | ICD-10-CM | POA: Diagnosis not present

## 2018-09-21 DIAGNOSIS — L718 Other rosacea: Secondary | ICD-10-CM | POA: Diagnosis not present

## 2018-09-21 DIAGNOSIS — C44612 Basal cell carcinoma of skin of right upper limb, including shoulder: Secondary | ICD-10-CM | POA: Diagnosis not present

## 2018-09-21 DIAGNOSIS — C4492 Squamous cell carcinoma of skin, unspecified: Secondary | ICD-10-CM

## 2018-09-21 DIAGNOSIS — D485 Neoplasm of uncertain behavior of skin: Secondary | ICD-10-CM | POA: Diagnosis not present

## 2018-09-21 DIAGNOSIS — Z1283 Encounter for screening for malignant neoplasm of skin: Secondary | ICD-10-CM | POA: Diagnosis not present

## 2018-09-21 DIAGNOSIS — D2239 Melanocytic nevi of other parts of face: Secondary | ICD-10-CM | POA: Diagnosis not present

## 2018-09-21 DIAGNOSIS — D692 Other nonthrombocytopenic purpura: Secondary | ICD-10-CM | POA: Diagnosis not present

## 2018-09-21 DIAGNOSIS — D1800 Hemangioma unspecified site: Secondary | ICD-10-CM | POA: Diagnosis not present

## 2018-09-21 HISTORY — DX: Squamous cell carcinoma of skin, unspecified: C44.92

## 2018-09-28 ENCOUNTER — Telehealth: Payer: Self-pay | Admitting: Gastroenterology

## 2018-09-28 NOTE — Telephone Encounter (Signed)
Pt left vm she states [facility] will no longer have her rx availbale she needs to speak with Ginger, also about about Bruce rx  He needs new rx called in as well

## 2018-09-29 ENCOUNTER — Other Ambulatory Visit: Payer: Self-pay

## 2018-09-29 MED ORDER — DEXLANSOPRAZOLE 60 MG PO CPDR: 60.0000 mg | DELAYED_RELEASE_CAPSULE | Freq: Every day | ORAL | 0 refills | Status: DC

## 2018-09-29 NOTE — Telephone Encounter (Signed)
Spoke with pt regarding her Dexilant rx. Pt stated [employer] will no longer pay for this medication after 11/10/18. Refilled Dexilant today for her. Will discuss changing to recommendation once she completes rx.

## 2018-09-30 ENCOUNTER — Other Ambulatory Visit: Payer: Self-pay | Admitting: Family Medicine

## 2018-10-13 DIAGNOSIS — M25559 Pain in unspecified hip: Secondary | ICD-10-CM | POA: Diagnosis not present

## 2018-10-13 DIAGNOSIS — M5416 Radiculopathy, lumbar region: Secondary | ICD-10-CM | POA: Diagnosis not present

## 2018-10-22 DIAGNOSIS — H40003 Preglaucoma, unspecified, bilateral: Secondary | ICD-10-CM | POA: Diagnosis not present

## 2018-10-25 DIAGNOSIS — M5416 Radiculopathy, lumbar region: Secondary | ICD-10-CM | POA: Diagnosis not present

## 2018-11-01 DIAGNOSIS — L570 Actinic keratosis: Secondary | ICD-10-CM | POA: Diagnosis not present

## 2018-11-01 DIAGNOSIS — M25519 Pain in unspecified shoulder: Secondary | ICD-10-CM | POA: Diagnosis not present

## 2018-11-01 DIAGNOSIS — Z85828 Personal history of other malignant neoplasm of skin: Secondary | ICD-10-CM | POA: Diagnosis not present

## 2018-11-01 DIAGNOSIS — Z8589 Personal history of malignant neoplasm of other organs and systems: Secondary | ICD-10-CM

## 2018-11-01 DIAGNOSIS — D0439 Carcinoma in situ of skin of other parts of face: Secondary | ICD-10-CM | POA: Diagnosis not present

## 2018-11-01 HISTORY — DX: Personal history of malignant neoplasm of other organs and systems: Z85.89

## 2018-11-03 DIAGNOSIS — H02051 Trichiasis without entropian right upper eyelid: Secondary | ICD-10-CM | POA: Diagnosis not present

## 2018-11-17 DIAGNOSIS — M25512 Pain in left shoulder: Secondary | ICD-10-CM | POA: Diagnosis not present

## 2018-12-22 ENCOUNTER — Other Ambulatory Visit: Payer: Self-pay | Admitting: Gastroenterology

## 2018-12-22 ENCOUNTER — Other Ambulatory Visit: Payer: Self-pay | Admitting: Family Medicine

## 2018-12-22 DIAGNOSIS — M25512 Pain in left shoulder: Secondary | ICD-10-CM | POA: Diagnosis not present

## 2018-12-24 DIAGNOSIS — G43119 Migraine with aura, intractable, without status migrainosus: Secondary | ICD-10-CM | POA: Diagnosis not present

## 2018-12-24 DIAGNOSIS — E611 Iron deficiency: Secondary | ICD-10-CM | POA: Diagnosis not present

## 2018-12-24 DIAGNOSIS — R252 Cramp and spasm: Secondary | ICD-10-CM | POA: Diagnosis not present

## 2019-01-26 ENCOUNTER — Other Ambulatory Visit: Payer: Self-pay

## 2019-01-26 MED ORDER — OMEPRAZOLE 40 MG PO CPDR: 40.0000 mg | DELAYED_RELEASE_CAPSULE | Freq: Every day | ORAL | 1 refills | Status: DC

## 2019-03-23 DIAGNOSIS — M19012 Primary osteoarthritis, left shoulder: Secondary | ICD-10-CM | POA: Diagnosis not present

## 2019-03-23 DIAGNOSIS — M5416 Radiculopathy, lumbar region: Secondary | ICD-10-CM | POA: Diagnosis not present

## 2019-04-05 DIAGNOSIS — L820 Inflamed seborrheic keratosis: Secondary | ICD-10-CM | POA: Diagnosis not present

## 2019-04-05 DIAGNOSIS — L308 Other specified dermatitis: Secondary | ICD-10-CM | POA: Diagnosis not present

## 2019-04-05 DIAGNOSIS — Z85828 Personal history of other malignant neoplasm of skin: Secondary | ICD-10-CM | POA: Diagnosis not present

## 2019-05-03 DIAGNOSIS — L308 Other specified dermatitis: Secondary | ICD-10-CM | POA: Diagnosis not present

## 2019-05-03 DIAGNOSIS — L718 Other rosacea: Secondary | ICD-10-CM | POA: Diagnosis not present

## 2019-05-03 DIAGNOSIS — L821 Other seborrheic keratosis: Secondary | ICD-10-CM | POA: Diagnosis not present

## 2019-05-25 DIAGNOSIS — M545 Low back pain: Secondary | ICD-10-CM | POA: Diagnosis not present

## 2019-05-30 ENCOUNTER — Other Ambulatory Visit: Payer: Self-pay | Admitting: Family Medicine

## 2019-05-30 DIAGNOSIS — Z1231 Encounter for screening mammogram for malignant neoplasm of breast: Secondary | ICD-10-CM

## 2019-06-01 DIAGNOSIS — M545 Low back pain: Secondary | ICD-10-CM | POA: Diagnosis not present

## 2019-06-13 DIAGNOSIS — H40003 Preglaucoma, unspecified, bilateral: Secondary | ICD-10-CM | POA: Diagnosis not present

## 2019-06-14 ENCOUNTER — Other Ambulatory Visit: Payer: Self-pay

## 2019-06-14 MED ORDER — OMEPRAZOLE 40 MG PO CPDR: 40.0000 mg | DELAYED_RELEASE_CAPSULE | Freq: Two times a day (BID) | ORAL | 3 refills | Status: DC

## 2019-06-27 DIAGNOSIS — R252 Cramp and spasm: Secondary | ICD-10-CM | POA: Diagnosis not present

## 2019-06-27 DIAGNOSIS — G43119 Migraine with aura, intractable, without status migrainosus: Secondary | ICD-10-CM | POA: Diagnosis not present

## 2019-06-27 DIAGNOSIS — E611 Iron deficiency: Secondary | ICD-10-CM | POA: Diagnosis not present

## 2019-07-01 ENCOUNTER — Other Ambulatory Visit: Payer: Self-pay | Admitting: Family Medicine

## 2019-07-07 ENCOUNTER — Ambulatory Visit
Admission: RE | Admit: 2019-07-07 | Discharge: 2019-07-07 | Disposition: A | Discharge status: Home / Self Care | Payer: Medicare Other | Source: Ambulatory Visit | Attending: Family Medicine | Admitting: Family Medicine

## 2019-07-07 DIAGNOSIS — Z1231 Encounter for screening mammogram for malignant neoplasm of breast: Secondary | ICD-10-CM | POA: Insufficient documentation

## 2019-07-07 LAB — HM MAMMOGRAPHY

## 2019-07-11 ENCOUNTER — Encounter: Payer: Self-pay | Admitting: Family Medicine

## 2019-07-14 ENCOUNTER — Telehealth: Payer: Self-pay | Admitting: Family Medicine

## 2019-07-14 DIAGNOSIS — D751 Secondary polycythemia: Secondary | ICD-10-CM

## 2019-07-14 DIAGNOSIS — E785 Hyperlipidemia, unspecified: Secondary | ICD-10-CM

## 2019-07-14 DIAGNOSIS — E039 Hypothyroidism, unspecified: Secondary | ICD-10-CM

## 2019-07-14 DIAGNOSIS — R252 Cramp and spasm: Secondary | ICD-10-CM

## 2019-07-14 DIAGNOSIS — E038 Other specified hypothyroidism: Secondary | ICD-10-CM

## 2019-07-14 NOTE — Telephone Encounter (Signed)
Pt went to [facility] and she said the PA needed her to request getting her magnesium, potassium, and ferritin levels checked. She has a lab appointment on 07/21/19 and wants to know if this can be done at her visit?

## 2019-07-15 DIAGNOSIS — R252 Cramp and spasm: Secondary | ICD-10-CM | POA: Insufficient documentation

## 2019-07-15 NOTE — Telephone Encounter (Signed)
Ok to do. Labs ordered.  Insurance may not cover ferritin so she may have to pay out of pocket.  Does she have restless leg syndrome? If so then ferritin should be covered for this.

## 2019-07-15 NOTE — Telephone Encounter (Signed)
Pt returning call.  States she does not have RLS.  She wants to hold off on having ferritin checked at this time.

## 2019-07-15 NOTE — Addendum Note (Signed)
Addended by: Ria Bush on: 07/15/2019 04:25 PM   Modules accepted: Orders

## 2019-07-15 NOTE — Telephone Encounter (Signed)
Left message on vm per dpr relaying Dr. G's message.  

## 2019-07-15 NOTE — Telephone Encounter (Signed)
Ferritin cancelled.

## 2019-07-21 ENCOUNTER — Other Ambulatory Visit (INDEPENDENT_AMBULATORY_CARE_PROVIDER_SITE_OTHER): Payer: Medicare Other

## 2019-07-21 ENCOUNTER — Ambulatory Visit: Payer: Medicare Other

## 2019-07-21 DIAGNOSIS — D751 Secondary polycythemia: Secondary | ICD-10-CM | POA: Diagnosis not present

## 2019-07-21 DIAGNOSIS — E038 Other specified hypothyroidism: Secondary | ICD-10-CM

## 2019-07-21 DIAGNOSIS — R252 Cramp and spasm: Secondary | ICD-10-CM | POA: Diagnosis not present

## 2019-07-21 DIAGNOSIS — E785 Hyperlipidemia, unspecified: Secondary | ICD-10-CM | POA: Diagnosis not present

## 2019-07-21 DIAGNOSIS — E039 Hypothyroidism, unspecified: Secondary | ICD-10-CM | POA: Diagnosis not present

## 2019-07-21 LAB — LIPID PANEL
Cholesterol: 159 mg/dL (ref 0–200)
HDL: 42.8 mg/dL (ref >39.00)
LDL Cholesterol: 85 mg/dL (ref 0–99)
NonHDL: 116.35
Total CHOL/HDL Ratio: 4
Triglycerides: 157 mg/dL — ABNORMAL HIGH (ref 0.0–149.0)
VLDL: 31.4 mg/dL (ref 0.0–40.0)

## 2019-07-21 LAB — COMPREHENSIVE METABOLIC PANEL
ALT: 23 U/L (ref 0–35)
AST: 26 U/L (ref 0–37)
Albumin: 4.2 g/dL (ref 3.5–5.2)
Alkaline Phosphatase: 93 U/L (ref 39–117)
BUN: 14 mg/dL (ref 6–23)
CO2: 25 mEq/L (ref 19–32)
Calcium: 9.2 mg/dL (ref 8.4–10.5)
Chloride: 109 mEq/L (ref 96–112)
Creatinine, Ser: 0.77 mg/dL (ref 0.40–1.20)
GFR: 72.46 mL/min (ref >60.00)
Glucose, Bld: 81 mg/dL (ref 70–99)
Potassium: 4 mEq/L (ref 3.5–5.1)
Sodium: 141 mEq/L (ref 135–145)
Total Bilirubin: 0.5 mg/dL (ref 0.2–1.2)
Total Protein: 6.3 g/dL (ref 6.0–8.3)

## 2019-07-21 LAB — CBC WITH DIFFERENTIAL/PLATELET
Basophils Absolute: 0.1 10*3/uL (ref 0.0–0.1)
Basophils Relative: 1.3 % (ref 0.0–3.0)
Eosinophils Absolute: 0.1 10*3/uL (ref 0.0–0.7)
Eosinophils Relative: 3 % (ref 0.0–5.0)
HCT: 44.6 % (ref 36.0–46.0)
Hemoglobin: 14.9 g/dL (ref 12.0–15.0)
Lymphocytes Relative: 33.7 % (ref 12.0–46.0)
Lymphs Abs: 1.4 10*3/uL (ref 0.7–4.0)
MCHC: 33.4 g/dL (ref 30.0–36.0)
MCV: 92.9 fl (ref 78.0–100.0)
Monocytes Absolute: 0.2 10*3/uL (ref 0.1–1.0)
Monocytes Relative: 4.4 % (ref 3.0–12.0)
Neutro Abs: 2.4 10*3/uL (ref 1.4–7.7)
Neutrophils Relative %: 57.6 % (ref 43.0–77.0)
Platelets: 146 10*3/uL — ABNORMAL LOW (ref 150.0–400.0)
RBC: 4.8 Mil/uL (ref 3.87–5.11)
RDW: 13.7 % (ref 11.5–15.5)
WBC: 4.2 10*3/uL (ref 4.0–10.5)

## 2019-07-21 LAB — MAGNESIUM: Magnesium: 2.2 mg/dL (ref 1.5–2.5)

## 2019-07-21 LAB — TSH: TSH: 3.5 u[IU]/mL (ref 0.35–4.50)

## 2019-07-21 LAB — T4, FREE: Free T4: 0.79 ng/dL (ref 0.60–1.60)

## 2019-07-25 ENCOUNTER — Other Ambulatory Visit: Payer: Self-pay

## 2019-07-25 ENCOUNTER — Ambulatory Visit (INDEPENDENT_AMBULATORY_CARE_PROVIDER_SITE_OTHER): Payer: Medicare Other | Admitting: Family Medicine

## 2019-07-25 ENCOUNTER — Encounter: Payer: Self-pay | Admitting: Family Medicine

## 2019-07-25 VITALS — BP 138/64 | HR 60 | Temp 97.8°F | Ht 63.0 in | Wt 158.0 lb

## 2019-07-25 DIAGNOSIS — G43709 Chronic migraine without aura, not intractable, without status migrainosus: Secondary | ICD-10-CM | POA: Diagnosis not present

## 2019-07-25 DIAGNOSIS — Z0000 Encounter for general adult medical examination without abnormal findings: Secondary | ICD-10-CM | POA: Insufficient documentation

## 2019-07-25 DIAGNOSIS — R002 Palpitations: Secondary | ICD-10-CM | POA: Diagnosis not present

## 2019-07-25 DIAGNOSIS — K219 Gastro-esophageal reflux disease without esophagitis: Secondary | ICD-10-CM | POA: Diagnosis not present

## 2019-07-25 DIAGNOSIS — E038 Other specified hypothyroidism: Secondary | ICD-10-CM

## 2019-07-25 DIAGNOSIS — R9431 Abnormal electrocardiogram [ECG] [EKG]: Secondary | ICD-10-CM | POA: Diagnosis not present

## 2019-07-25 DIAGNOSIS — E785 Hyperlipidemia, unspecified: Secondary | ICD-10-CM

## 2019-07-25 DIAGNOSIS — Z0001 Encounter for general adult medical examination with abnormal findings: Secondary | ICD-10-CM

## 2019-07-25 DIAGNOSIS — IMO0002 Reserved for concepts with insufficient information to code with codable children: Secondary | ICD-10-CM

## 2019-07-25 DIAGNOSIS — E039 Hypothyroidism, unspecified: Secondary | ICD-10-CM | POA: Diagnosis not present

## 2019-07-25 NOTE — Assessment & Plan Note (Addendum)
Progressively worsening despite toprol XL 25mg  - will check EKG today and refer to cardiology.  EKG - LAFB, ?q waves anteriorly. Unfortunately no old EKG to compare.

## 2019-07-25 NOTE — Progress Notes (Signed)
This visit was conducted in person.  BP 138/64 (BP Location: Left Arm, Patient Position: Sitting, Cuff Size: Normal)   Pulse 60   Temp 97.8 F (36.6 C) (Temporal)   Ht 5\' 3"  (1.6 m)   Wt 158 lb (71.7 kg)   SpO2 98%   BMI 27.99 kg/m    CC: AMW Subjective:    Patient ID: Alexandria Moreno, female    DOB: 08/06/1941, 78 y.o.   MRN: [id number]  HPI: Alexandria Moreno is a 78 y.o. female presenting on 07/25/2019 for Medicare Wellness   Did not see health advisor this year.    Hearing Screening   125Hz  250Hz  500Hz  1000Hz  2000Hz  3000Hz  4000Hz  6000Hz  8000Hz   Right ear:   25 20 20  25     Left ear:   25 40 20  25    Vision Screening Comments: Last eye exam, 08/2018    Office Visit from 07/25/2019 in [place] at [facility] Total Score  0      Fall Risk  07/25/2019 07/07/2018 07/01/2017 05/22/2016 11/19/2015  Falls in the past year? 1 No No No No  Number falls in past yr: 0 - - - -  Injury with Fall? 1 - - - -   Was happy to see grandchildren recently.   Palpitations - progressively worsening over last few months, stays fatigued, rare chest pressure. Palpitations getting to be daily. Palpitations described as racing heart. One day was presyncopal. She exercises regularly on stationary bicycle (70 miles this past month). Endorses h/o rheumatic heart disease. Fmhx CAD.   Neurology has recommended stopping vit B12  Preventative: [facility]. Known h/o rectocele. S/p hysterectomy and ovaries removed for heavy bleeding (1981) COLONOSCOPY/capsule endoscopyDate: 2009 diverticulosis ([facility])  COLONOSCOPY WITH PROPOFOL 12/01/2017 TA, rpt 5 yrs ([facility]) Mammogram WNL 06/2019  Lung cancer screening - not eligible  DEXA Date: 06/2016 T -0.2 hip, 0.7 spine  Flu ALLERGY  Prevnar 2015, pneumovax 2009 Td 2009  zostavax 2014  shingrix - discussed to check with pharmacy  Advanced directives - scanned 07/2016. No life prolonging measures if  terminal irreversible condition. Separate HCPOA form filled out - Bruce husband then [employer] are [employer]. Scanned 08/2017.  Seat belt use discussed.  Sunscreen use discussed. No changing moles on skin. (Dr [facility]) Ex smoker - quit 1980. Alcohol - rare Dentist Q6 mo  Eye exam - yearly   Lives with husband  Occupation: retired, was Art therapist  Edu: some college  Activity: stationary bicycle  Diet: good water, following slim fast diet, good fruits/vegetables     Relevant past medical, surgical, family and social history reviewed and updated as indicated. Interim medical history since our last visit reviewed. Allergies and medications reviewed and updated. Outpatient Medications Prior to Visit  Medication Sig Dispense Refill  . atorvastatin (LIPITOR) 40 MG tablet TAKE 1 TABLET DAILY 90 tablet 3  . B COMPLEX VITAMINS PO Take by mouth daily.    . Calcium Carb-Cholecalciferol (CALCIUM-VITAMIN D3) 600-400 MG-UNIT TABS Take 1 tablet by mouth daily.    . Calcium Citrate (CITRACAL PO) Take 1 tablet by mouth at bedtime.    . chlorhexidine (PERIDEX) 0.12 % solution   0  . cycloSPORINE (RESTASIS) 0.05 % ophthalmic emulsion Apply 0.05 drops to eye 2 (two) times daily.    . diphenoxylate-atropine (LOMOTIL) 2.5-0.025 MG tablet Take 1 tablet by mouth 4 (four) times daily. 360 tablet 3  . glucosamine-chondroitin 500-400 MG tablet Take  1 tablet by mouth 2 (two) times daily.    . Methylcellulose, Laxative, (FIBER THERAPY PO) Take 3 tablets by mouth daily.    . MULTIPLE VITAMIN PO Take by mouth daily.    . OMEGA-3 FATTY ACIDS PO Take 1,200 mg by mouth daily.     Marland Kitchen omeprazole (PRILOSEC) 40 MG capsule Take 1 capsule (40 mg total) by mouth 2 (two) times a day. 180 capsule 3  . Probiotic PACK Take 0.5 packets by mouth daily.    . Probiotic Product (PROBIOTIC PO) Take 1 tablet by mouth daily.    . rizatriptan (MAXALT) 10 MG tablet Take 10 mg by mouth daily as needed.    . topiramate (TOPAMAX) 100  MG tablet Take 100 mg by mouth 2 (two) times daily.    . TOPROL XL 25 MG 24 hr tablet TAKE 1 TABLET DAILY 90 tablet 0  . cholecalciferol (VITAMIN D) 1000 units tablet Take 1,000 Units by mouth daily.    Marland Kitchen DEXILANT 60 MG capsule TAKE 1 CAPSULE DAILY 90 capsule 3  . docusate sodium (COLACE) 100 MG capsule Take 1 capsule by mouth daily.     No facility-administered medications prior to visit.      Per HPI unless specifically indicated in ROS section below Review of Systems Objective:    BP 138/64 (BP Location: Left Arm, Patient Position: Sitting, Cuff Size: Normal)   Pulse 60   Temp 97.8 F (36.6 C) (Temporal)   Ht 5\' 3"  (1.6 m)   Wt 158 lb (71.7 kg)   SpO2 98%   BMI 27.99 kg/m   Wt Readings from Last 3 Encounters:  07/25/19 158 lb (71.7 kg)  07/16/18 154 lb 4 oz (70 kg)  07/07/18 154 lb (69.9 kg)    Physical Exam Vitals signs and nursing note reviewed.  Constitutional:      General: She is not in acute distress.    Appearance: Normal appearance. She is well-developed. She is not ill-appearing.  HENT:     Head: Normocephalic and atraumatic.     Right Ear: Hearing, tympanic membrane, ear canal and external ear normal.     Left Ear: Hearing, tympanic membrane, ear canal and external ear normal.     Nose: Nose normal.     Mouth/Throat:     Mouth: Mucous membranes are moist.     Pharynx: Uvula midline. No oropharyngeal exudate or posterior oropharyngeal erythema.  Eyes:     General: No scleral icterus.    Extraocular Movements: Extraocular movements intact.     Conjunctiva/sclera: Conjunctivae normal.     Pupils: Pupils are equal, round, and reactive to light.  Neck:     Musculoskeletal: Normal range of motion and neck supple.     Vascular: No carotid bruit.  Cardiovascular:     Rate and Rhythm: Normal rate and regular rhythm.     Pulses: Normal pulses.          Radial pulses are 2+ on the right side and 2+ on the left side.     Heart sounds: Normal heart sounds. No  murmur.  Pulmonary:     Effort: Pulmonary effort is normal. No respiratory distress.     Breath sounds: Normal breath sounds. No wheezing, rhonchi or rales.  Abdominal:     General: Abdomen is flat. Bowel sounds are normal. There is no distension.     Palpations: Abdomen is soft. There is no mass.     Tenderness: There is no abdominal tenderness. There is  no guarding or rebound.     Hernia: No hernia is present.  Musculoskeletal: Normal range of motion.  Lymphadenopathy:     Cervical: No cervical adenopathy.  Skin:    General: Skin is warm and dry.     Findings: No rash.  Neurological:     General: No focal deficit present.     Mental Status: She is alert and oriented to person, place, and time.     Comments:  CN grossly intact, station and gait intact Recall 3/3 Calculation 3/5 [id number]  Psychiatric:        Mood and Affect: Mood normal.        Behavior: Behavior normal.        Thought Content: Thought content normal.        Judgment: Judgment normal.       Results for orders placed or performed in visit on 07/21/19  CBC with Differential/Platelet  Result Value Ref Range   WBC 4.2 4.0 - 10.5 K/uL   RBC 4.80 3.87 - 5.11 Mil/uL   Hemoglobin 14.9 12.0 - 15.0 g/dL   HCT 44.6 36.0 - 46.0 %   MCV 92.9 78.0 - 100.0 fl   MCHC 33.4 30.0 - 36.0 g/dL   RDW 13.7 11.5 - 15.5 %   Platelets 146.0 (L) 150.0 - 400.0 K/uL   Neutrophils Relative % 57.6 43.0 - 77.0 %   Lymphocytes Relative 33.7 12.0 - 46.0 %   Monocytes Relative 4.4 3.0 - 12.0 %   Eosinophils Relative 3.0 0.0 - 5.0 %   Basophils Relative 1.3 0.0 - 3.0 %   Neutro Abs 2.4 1.4 - 7.7 K/uL   Lymphs Abs 1.4 0.7 - 4.0 K/uL   Monocytes Absolute 0.2 0.1 - 1.0 K/uL   Eosinophils Absolute 0.1 0.0 - 0.7 K/uL   Basophils Absolute 0.1 0.0 - 0.1 K/uL  T4, free  Result Value Ref Range   Free T4 0.79 0.60 - 1.60 ng/dL  TSH  Result Value Ref Range   TSH 3.50 0.35 - 4.50 uIU/mL  Comprehensive metabolic panel  Result Value Ref  Range   Sodium 141 135 - 145 mEq/L   Potassium 4.0 3.5 - 5.1 mEq/L   Chloride 109 96 - 112 mEq/L   CO2 25 19 - 32 mEq/L   Glucose, Bld 81 70 - 99 mg/dL   BUN 14 6 - 23 mg/dL   Creatinine, Ser 0.77 0.40 - 1.20 mg/dL   Total Bilirubin 0.5 0.2 - 1.2 mg/dL   Alkaline Phosphatase 93 39 - 117 U/L   AST 26 0 - 37 U/L   ALT 23 0 - 35 U/L   Total Protein 6.3 6.0 - 8.3 g/dL   Albumin 4.2 3.5 - 5.2 g/dL   Calcium 9.2 8.4 - 10.5 mg/dL   GFR 72.46 >60.00 mL/min  Lipid panel  Result Value Ref Range   Cholesterol 159 0 - 200 mg/dL   Triglycerides 157.0 (H) 0.0 - 149.0 mg/dL   HDL 42.80 >39.00 mg/dL   VLDL 31.4 0.0 - 40.0 mg/dL   LDL Cholesterol 85 0 - 99 mg/dL   Total CHOL/HDL Ratio 4    NonHDL 116.35   Magnesium  Result Value Ref Range   Magnesium 2.2 1.5 - 2.5 mg/dL   EKG - sinus bradycardia 50s, LAD with LAFB, anterior Q waves, ?p mitrale, no old to compare Assessment & Plan:   Problem List Items Addressed This Visit    Subclinical hypothyroidism    TFTs remain stable.  Palpitation    Progressively worsening despite toprol XL 25mg  - will check EKG today and refer to cardiology.  EKG - LAFB, ?q waves anteriorly. Unfortunately no old EKG to compare.       Relevant Orders   EKG 12-Lead (Completed)   Ambulatory referral to Cardiology   Medicare annual wellness visit, subsequent - Primary    I have personally reviewed the Medicare Annual Wellness questionnaire and have noted 1. The patient's medical and social history 2. Their use of alcohol, tobacco or illicit drugs 3. Their current medications and supplements 4. The patient's functional ability including ADL's, fall risks, home safety risks and hearing or visual impairment. Cognitive function has been assessed and addressed as indicated.  5. Diet and physical activity 6. Evidence for depression or mood disorders The patients weight, height, BMI have been recorded in the chart. I have made referrals, counseling and provided  education to the patient based on review of the above and I have provided the pt with a written personalized care plan for preventive services. Provider list updated.. See scanned questionairre as needed for further documentation. Reviewed preventative protocols and updated unless pt declined.       GERD (gastroesophageal reflux disease)    On omeprazole 40mg  bid.       Dyslipidemia    Chronic, stable on atorvastatin and fish oil - continue. The 10-year ASCVD risk score Mikey Bussing DC Brooke Bonito., et al., 2013) is: 24.2%   Values used to calculate the score:     Age: 75 years     Sex: Female     Is Non-Hispanic African American: No     Diabetic: No     Tobacco smoker: No     Systolic Blood Pressure: 858 mmHg     Is BP treated: No     HDL Cholesterol: 42.8 mg/dL     Total Cholesterol: 159 mg/dL       Chronic migraine (Chronic)    Appreciate neurology care. She is happy with current regimen.       Abnormal EKG    EKG - sinus bradycardia 50s, LAD with LAFB, anterior Q waves, ?p mitrale, no old to compare. Will refer to cardiology for further evaluation.       Relevant Orders   Ambulatory referral to Cardiology       No orders of the defined types were placed in this encounter.  Orders Placed This Encounter  Procedures  . Ambulatory referral to Cardiology    Referral Priority:   Routine    Referral Type:   Consultation    Referral Reason:   Specialty Services Required    Requested Specialty:   Cardiology    Number of Visits Requested:   1  . EKG 12-Lead    Follow up plan: Return in about 1 year (around 07/24/2020) for medicare wellness visit.  Ria Bush, MD

## 2019-07-25 NOTE — Assessment & Plan Note (Signed)
EKG - sinus bradycardia 50s, LAD with LAFB, anterior Q waves, ?p mitrale, no old to compare. Will refer to cardiology for further evaluation.

## 2019-07-25 NOTE — Assessment & Plan Note (Signed)

## 2019-07-25 NOTE — Patient Instructions (Addendum)
EKG today.  We will refer you to cardiology for further evaluation of palpitations.  Good to see you today! Call us with questions. Return as needed or in 1 year for next wellness visit.   Health Maintenance After Age 78 After age 35, you are at a higher risk for certain long-term diseases and infections as well as injuries from falls. Falls are a major cause of broken bones and head injuries in people who are older than age 48. Getting regular preventive care can help to keep you healthy and well. Preventive care includes getting regular testing and making lifestyle changes as recommended by your health care provider. Talk with your health care provider about:  Which screenings and tests you should have. A screening is a test that checks for a disease when you have no symptoms.  A diet and exercise plan that is right for you. What should I know about screenings and tests to prevent falls? Screening and testing are the best ways to find a health problem early. Early diagnosis and treatment give you the best chance of managing medical conditions that are common after age 68. Certain conditions and lifestyle choices may make you more likely to have a fall. Your health care provider may recommend:  Regular vision checks. Poor vision and conditions such as cataracts can make you more likely to have a fall. If you wear glasses, make sure to get your prescription updated if your vision changes.  Medicine review. Work with your health care provider to regularly review all of the medicines you are taking, including over-the-counter medicines. Ask your health care provider about any side effects that may make you more likely to have a fall. Tell your health care provider if any medicines that you take make you feel dizzy or sleepy.  Osteoporosis screening. Osteoporosis is a condition that causes the bones to get weaker. This can make the bones weak and cause them to break more easily.  Blood pressure  screening. Blood pressure changes and medicines to control blood pressure can make you feel dizzy.  Strength and balance checks. Your health care provider may recommend certain tests to check your strength and balance while standing, walking, or changing positions.  Foot health exam. Foot pain and numbness, as well as not wearing proper footwear, can make you more likely to have a fall.  Depression screening. You may be more likely to have a fall if you have a fear of falling, feel emotionally low, or feel unable to do activities that you used to do.  Alcohol use screening. Using too much alcohol can affect your balance and may make you more likely to have a fall. What actions can I take to lower my risk of falls? General instructions  Talk with your health care provider about your risks for falling. Tell your health care provider if: ? You fall. Be sure to tell your health care provider about all falls, even ones that seem minor. ? You feel dizzy, sleepy, or off-balance.  Take over-the-counter and prescription medicines only as told by your health care provider. These include any supplements.  Eat a healthy diet and maintain a healthy weight. A healthy diet includes low-fat dairy products, low-fat (lean) meats, and fiber from whole grains, beans, and lots of fruits and vegetables. Home safety  Remove any tripping hazards, such as rugs, cords, and clutter.  Install safety equipment such as grab bars in bathrooms and safety rails on stairs.  Keep rooms and walkways well-lit. Activity  Follow a regular exercise program to stay fit. This will help you maintain your balance. Ask your health care provider what types of exercise are appropriate for you.  If you need a cane or walker, use it as recommended by your health care provider.  Wear supportive shoes that have nonskid soles. Lifestyle  Do not drink alcohol if your health care provider tells you not to drink.  If you drink  alcohol, limit how much you have: ? 0-1 drink a day for women. ? 0-2 drinks a day for men.  Be aware of how much alcohol is in your drink. In the [place] one drink equals one typical bottle of beer (12 oz), one-half glass of wine (5 oz), or one shot of hard liquor (1 oz).  Do not use any products that contain nicotine or tobacco, such as cigarettes and e-cigarettes. If you need help quitting, ask your health care provider. Summary  Having a healthy lifestyle and getting preventive care can help to protect your health and wellness after age 55.  Screening and testing are the best way to find a health problem early and help you avoid having a fall. Early diagnosis and treatment give you the best chance for managing medical conditions that are more common for people who are older than age 70.  Falls are a major cause of broken bones and head injuries in people who are older than age 66. Take precautions to prevent a fall at home.  Work with your health care provider to learn what changes you can make to improve your health and wellness and to prevent falls. This information is not intended to replace advice given to you by your health care provider. Make sure you discuss any questions you have with your health care provider. Document Released: 10/14/2017 Document Revised: 03/24/2019 Document Reviewed: 10/14/2017 Elsevier Patient Education  2020 [employer].

## 2019-07-25 NOTE — Assessment & Plan Note (Addendum)
Chronic, stable on atorvastatin and fish oil - continue. The 10-year ASCVD risk score Mikey Bussing DC Brooke Bonito., et al., 2013) is: 24.2%   Values used to calculate the score:     Age: 78 years     Sex: Female     Is Non-Hispanic African American: No     Diabetic: No     Tobacco smoker: No     Systolic Blood Pressure: 015 mmHg     Is BP treated: No     HDL Cholesterol: 42.8 mg/dL     Total Cholesterol: 159 mg/dL

## 2019-07-25 NOTE — Assessment & Plan Note (Signed)
On omeprazole 40mg  bid.

## 2019-07-25 NOTE — Assessment & Plan Note (Signed)
TFTs remain stable.

## 2019-07-25 NOTE — Assessment & Plan Note (Signed)
Appreciate neurology care. She is happy with current regimen.

## 2019-08-29 ENCOUNTER — Ambulatory Visit (INDEPENDENT_AMBULATORY_CARE_PROVIDER_SITE_OTHER): Payer: Medicare Other

## 2019-08-29 ENCOUNTER — Encounter: Payer: Self-pay | Admitting: Cardiology

## 2019-08-29 ENCOUNTER — Other Ambulatory Visit: Payer: Self-pay

## 2019-08-29 ENCOUNTER — Ambulatory Visit (INDEPENDENT_AMBULATORY_CARE_PROVIDER_SITE_OTHER): Payer: Medicare Other | Admitting: Cardiology

## 2019-08-29 VITALS — BP 140/78 | HR 60 | Temp 97.0°F | Ht 64.0 in | Wt 159.0 lb

## 2019-08-29 DIAGNOSIS — R0609 Other forms of dyspnea: Secondary | ICD-10-CM | POA: Diagnosis not present

## 2019-08-29 DIAGNOSIS — E7800 Pure hypercholesterolemia, unspecified: Secondary | ICD-10-CM | POA: Diagnosis not present

## 2019-08-29 DIAGNOSIS — R002 Palpitations: Secondary | ICD-10-CM

## 2019-08-29 NOTE — Patient Instructions (Signed)
Medication Instructions:  - Your physician recommends that you continue on your current medications as directed. Please refer to the Current Medication list given to you today.  If you need a refill on your cardiac medications before your next appointment, please call your pharmacy.   Lab work: - none ordered  If you have labs (blood work) drawn today and your tests are completely normal, you will receive your results only by: Marland Kitchen MyChart Message (if you have MyChart) OR . A paper copy in the mail If you have any lab test that is abnormal or we need to change your treatment, we will call you to review the results.  Testing/Procedures: - Your physician has requested that you have an echocardiogram. Echocardiography is a painless test that uses sound waves to create images of your heart. It provides your doctor with information about the size and shape of your heart and how well your heart's chambers and valves are working. This procedure takes approximately one hour. There are no restrictions for this procedure.  - Your physician has recommended that you wear a Zio monitor (14-day). This monitor is a medical device that records the heart's electrical activity. Doctors most often use these monitors to diagnose arrhythmias. Arrhythmias are problems with the speed or rhythm of the heartbeat. The monitor is a small device applied to your chest. You can wear one while you do your normal daily activities. While wearing this monitor if you have any symptoms to push the button and record what you felt. Once you have worn this monitor for the period of time provider prescribed (Usually 14 days), you will return the monitor device in the postage paid box. Once it is returned they will download the data collected and provide [place] with a report which the provider will then review and we will call you with those results. Important tips:  1. Avoid showering during the first 24 hours of wearing the monitor. 2. Avoid  excessive sweating to help maximize wear time. 3. Do not submerge the device, no hot tubs, and no swimming pools. 4. Keep any lotions or oils away from the patch. 5. After 24 hours you may shower with the patch on. Take brief showers with your back facing the shower head.  6. Do not remove patch once it has been placed because that will interrupt data and decrease adhesive wear time. 7. Push the button when you have any symptoms and write down what you were feeling. 8. Once you have completed wearing your monitor, remove and place into box which has postage paid and place in your outgoing mailbox.  9. If for some reason you have misplaced your box then call our office and we can provide another box and/or mail it off for you.       Follow-Up: At [facility], you and your health needs are our priority.  As part of our continuing mission to provide you with exceptional heart care, we have created designated Provider Care Teams.  These Care Teams include your primary Cardiologist (physician) and Advanced Practice Providers (APPs -  Physician Assistants and Nurse Practitioners) who all work together to provide you with the care you need, when you need it. . in 4-6 weeks with Dr. Garen Lah  Any Other Special Instructions Will Be Listed Below (If Applicable). - N/A

## 2019-08-29 NOTE — Progress Notes (Signed)
Cardiology Office Note:    Date:  08/29/2019   ID:  Alexandria Moreno, DOB January 14, 1941, MRN [id number]  PCP:  Ria Bush, MD  Cardiologist:  Kate Sable, MD  Electrophysiologist:  None   Referring MD: Ria Bush, MD   Chief Complaint  Patient presents with  . office visit    Abnormal EKG/palpitations    History of Present Illness:    Alexandria Moreno is a 78 y.o. female with a hx of palpitations rheumatic fever at age 27 years who presents due to palpitations over the last couple of weeks.  Patient has had symptoms of palpitations for years now and originally was placed on Toprol.  She has been taking Toprol which has helped with the symptoms, but over the past month or so the symptoms have increased in frequency.  She states symptoms of palpitations occur randomly sometimes at rest and sometimes with exertion.  The palpitations sometimes cause her to be dizzy.  She also states having symptoms of dyspnea with exertion.  She used to be an active mountain climber but does not do that anymore as much due to her husband having Parkinson disease.  Sometimes making up her bed causes her to be very short of breath.  She has been told in the past she had a leaky valve as reviewed by an echo performed around 2012.  She denies ever having any history of MI but states her family has strong history of myocardial infarction and strokes.  MI in 2 siblings, stroke in mother.  She denies drinking caffeine and only drinks decaffeinated coffee.  She denies edema, syncope.  Past Medical History:  Diagnosis Date  . Bilateral carotid bruits 2008   per GI note  . Diverticulosis   . DJD (degenerative joint disease) of knee   . Frequent headaches   . GERD (gastroesophageal reflux disease)   . H/O transfusion of whole blood 2009  . H/O: GI bleed 2009   due to ASA  . H/O: rheumatic fever    78 years old  . Heart valve problem   . History of chicken pox   . Hx of basal cell carcinoma    multiple sites  . 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 Hyperlipidemia   . Migraines   . Palpitation    controlled with Toprol  . Pelvic prolapse    with rectocele  . Rosacea   . Seasonal allergic rhinitis    mild  . Stress incontinence     Past Surgical History:  Procedure Laterality Date  . ABDOMINAL HYSTERECTOMY  1981  . APPENDECTOMY  1960  . capsule endoscopy  2009   WNL  . CATARACT EXTRACTION Bilateral [id number]  . COLONOSCOPY  2009   diverticulosis ([place])  . COLONOSCOPY WITH PROPOFOL N/A 12/01/2017   TA, rpt 5 yrs ([place])  . DEXA  06/2016   T -0.2 hip, 0.7 spine  . ESOPHAGOGASTRODUODENOSCOPY  2009   focal mild chronic gastritis, neg H pylori ([place])  . ESOPHAGOGASTRODUODENOSCOPY (EGD) WITH PROPOFOL N/A 12/01/2017   Procedure: ESOPHAGOGASTRODUODENOSCOPY (EGD) WITH PROPOFOL;  Surgeon: Lucilla Lame, MD;  Location: [facility] ENDOSCOPY;  Service: Endoscopy;  Laterality: N/A;  . INCONTINENCE SURGERY  2001   with mesh  . LIPOMA EXCISION  1971   right shoulder  . [place]  . OVARIAN CYST REMOVAL  1960  . SALPINGOOPHORECTOMY Left 2001  . TONSILLECTOMY  1958    Current Medications:  Current Meds  Medication Sig  . atorvastatin (LIPITOR) 40 MG tablet TAKE 1 TABLET DAILY  . B COMPLEX VITAMINS PO Take by mouth daily.  . Calcium Citrate (CITRACAL PO) Take 1 tablet by mouth at bedtime.  . chlorhexidine (PERIDEX) 0.12 % solution as needed.   . cycloSPORINE (RESTASIS) 0.05 % ophthalmic emulsion Apply 0.05 drops to eye 2 (two) times daily.  . diphenoxylate-atropine (LOMOTIL) 2.5-0.025 MG tablet Take 1 tablet by mouth 4 (four) times daily. (Patient taking differently: Take 1 tablet by mouth 4 (four) times daily as needed. )  . glucosamine-chondroitin 500-400 MG tablet Take 1 tablet by mouth 2 (two) times daily.  . Methylcellulose, Laxative, (FIBER THERAPY PO) Take 3 tablets by mouth daily.  . MULTIPLE VITAMIN PO Take by mouth daily.   . OMEGA-3 FATTY ACIDS PO Take 1,200 mg by mouth daily.   Marland Kitchen omeprazole (PRILOSEC) 40 MG capsule Take 1 capsule (40 mg total) by mouth 2 (two) times a day. (Patient taking differently: Take 40 mg by mouth daily. )  . Probiotic Product (PROBIOTIC PO) Take 1 tablet by mouth daily.  . rizatriptan (MAXALT) 10 MG tablet Take 10 mg by mouth daily as needed.  . topiramate (TOPAMAX) 100 MG tablet Take 100 mg by mouth 2 (two) times daily.  . TOPROL XL 25 MG 24 hr tablet TAKE 1 TABLET DAILY     Allergies:   Aspirin, Baclofen, Codeine, Imitrex  [sumatriptan], Influenza vaccines, and Vicodin  [hydrocodone-acetaminophen]   Social History   Socioeconomic History  . Marital status: Married    Spouse name: Not on file  . Number of children: Not on file  . Years of education: Not on file  . Highest education level: Not on file  Occupational History  . Not on file  Social Needs  . Financial resource strain: Not on file  . Food insecurity    Worry: Not on file    Inability: Not on file  . Transportation needs    Medical: Not on file    Non-medical: Not on file  Tobacco Use  . Smoking status: Former Smoker    Quit date: 12/15/1978    Years since quitting: 40.7  . Smokeless tobacco: Never Used  Substance and Sexual Activity  . Alcohol use: No    Alcohol/week: 0.0 standard drinks    Comment: Rare  . Drug use: No  . Sexual activity: Never  Lifestyle  . Physical activity    Days per week: Not on file    Minutes per session: Not on file  . Stress: Not on file  Relationships  . Social Herbalist on phone: Not on file    Gets together: Not on file    Attends religious service: Not on file    Active member of club or organization: Not on file    Attends meetings of clubs or organizations: Not on file    Relationship status: Not on file  Other Topics Concern  . Not on file  Social History Narrative   Lives with husband    Occupation: retired, was Art therapist   Edu: some  college   Activity: stationary bicycle   Diet: good water, following slim fast diet, good vegetables     Family History: The patient's family history includes Alcohol abuse in her father; Alzheimer's disease in her maternal grandfather; Cancer in her mother; Dementia in her mother; Depression in her mother; Heart attack in her brother, brother, and father; Heart disease in  her father; Hypertension in her brother; Osteoporosis in her father; Stroke in her brother and paternal grandfather; Stroke (age of onset: 21) in her mother; Stroke (age of onset: 60) in her brother. There is no history of Breast cancer.  ROS:   Please see the history of present illness.     All other systems reviewed and are negative.  EKGs/Labs/Other Studies Reviewed:    The following studies were reviewed today:   EKG:  EKG is  ordered today.  The ekg ordered today demonstrates normal sinus rhythm, possible old septal infarct.   Recent Labs: 07/21/2019: ALT 23; BUN 14; Creatinine, Ser 0.77; Hemoglobin 14.9; Magnesium 2.2; Platelets 146.0; Potassium 4.0; Sodium 141; TSH 3.50  Recent Lipid Panel    Component Value Date/Time   CHOL 159 07/21/2019 0919   CHOL 144 11/19/2015 1119   TRIG 157.0 (H) 07/21/2019 0919   HDL 42.80 07/21/2019 0919   HDL 42 11/19/2015 1119   CHOLHDL 4 07/21/2019 0919   VLDL 31.4 07/21/2019 0919   LDLCALC 85 07/21/2019 0919   LDLCALC 71 11/19/2015 1119    Physical Exam:    VS:  BP 140/78 (BP Location: Right Arm, Patient Position: Sitting, Cuff Size: Normal)   Pulse 60   Temp (!) 97 F (36.1 C)   Ht 5\' 4"  (1.626 m)   Wt 159 lb (72.1 kg)   SpO2 96%   BMI 27.29 kg/m     Wt Readings from Last 3 Encounters:  08/29/19 159 lb (72.1 kg)  07/25/19 158 lb (71.7 kg)  07/16/18 154 lb 4 oz (70 kg)     GEN:  Well nourished, well developed in no acute distress HEENT: Normal NECK: No JVD; No carotid bruits LYMPHATICS: No lymphadenopathy CARDIAC: RRR, no murmurs, rubs, gallops  RESPIRATORY:  Clear to auscultation without rales, wheezing or rhonchi  ABDOMEN: Soft, non-tender, non-distended MUSCULOSKELETAL:  No edema; No deformity  SKIN: Warm and dry NEUROLOGIC:  Alert and oriented x 3 PSYCHIATRIC:  Normal affect   ASSESSMENT:    1. Dyspnea on exertion   2. Palpitations   3. Pure hypercholesterolemia    PLAN:    In order of problems listed above:  1. We will obtain echocardiogram to evaluate for cardiomyopathy and also evaluate valve function in light of prior history of rheumatic fever and possible heart valve disease.  2.  We will get a ZIO patch for 2 weeks to evaluate palpitations.  Continue Toprol 25 daily as currently prescribed.  3. Continue taking Lipitor as currently prescribed  Medication Adjustments/Labs and Tests Ordered: Current medicines are reviewed at length with the patient today.  Concerns regarding medicines are outlined above.  Orders Placed This Encounter  Procedures  . LONG TERM MONITOR (3-14 DAYS)  . EKG 12-Lead  . ECHOCARDIOGRAM COMPLETE   No orders of the defined types were placed in this encounter.   Patient Instructions  Medication Instructions:  - Your physician recommends that you continue on your current medications as directed. Please refer to the Current Medication list given to you today.  If you need a refill on your cardiac medications before your next appointment, please call your pharmacy.   Lab work: - none ordered  If you have labs (blood work) drawn today and your tests are completely normal, you will receive your results only by: Marland Kitchen MyChart Message (if you have MyChart) OR . A paper copy in the mail If you have any lab test that is abnormal or we need to change your  treatment, we will call you to review the results.  Testing/Procedures: - Your physician has requested that you have an echocardiogram. Echocardiography is a painless test that uses sound waves to create images of your heart. It provides  your doctor with information about the size and shape of your heart and how well your heart's chambers and valves are working. This procedure takes approximately one hour. There are no restrictions for this procedure.  - Your physician has recommended that you wear a Zio monitor (14-day). This monitor is a medical device that records the heart's electrical activity. Doctors most often use these monitors to diagnose arrhythmias. Arrhythmias are problems with the speed or rhythm of the heartbeat. The monitor is a small device applied to your chest. You can wear one while you do your normal daily activities. While wearing this monitor if you have any symptoms to push the button and record what you felt. Once you have worn this monitor for the period of time provider prescribed (Usually 14 days), you will return the monitor device in the postage paid box. Once it is returned they will download the data collected and provide [place] with a report which the provider will then review and we will call you with those results. Important tips:  1. Avoid showering during the first 24 hours of wearing the monitor. 2. Avoid excessive sweating to help maximize wear time. 3. Do not submerge the device, no hot tubs, and no swimming pools. 4. Keep any lotions or oils away from the patch. 5. After 24 hours you may shower with the patch on. Take brief showers with your back facing the shower head.  6. Do not remove patch once it has been placed because that will interrupt data and decrease adhesive wear time. 7. Push the button when you have any symptoms and write down what you were feeling. 8. Once you have completed wearing your monitor, remove and place into box which has postage paid and place in your outgoing mailbox.  9. If for some reason you have misplaced your box then call our office and we can provide another box and/or mail it off for you.       Follow-Up: At [facility], you and your health needs are our  priority.  As part of our continuing mission to provide you with exceptional heart care, we have created designated Provider Care Teams.  These Care Teams include your primary Cardiologist (physician) and Advanced Practice Providers (APPs -  Physician Assistants and Nurse Practitioners) who all work together to provide you with the care you need, when you need it. . in 4-6 weeks with Dr. Garen Lah  Any Other Special Instructions Will Be Listed Below (If Applicable). - N/A      Signed, Kate Sable, MD  08/29/2019 10:46 AM    [place]

## 2019-08-30 DIAGNOSIS — D3131 Benign neoplasm of right choroid: Secondary | ICD-10-CM | POA: Diagnosis not present

## 2019-08-30 DIAGNOSIS — H524 Presbyopia: Secondary | ICD-10-CM | POA: Diagnosis not present

## 2019-08-30 DIAGNOSIS — H40013 Open angle with borderline findings, low risk, bilateral: Secondary | ICD-10-CM | POA: Diagnosis not present

## 2019-08-30 DIAGNOSIS — H04123 Dry eye syndrome of bilateral lacrimal glands: Secondary | ICD-10-CM | POA: Diagnosis not present

## 2019-09-12 DIAGNOSIS — R002 Palpitations: Secondary | ICD-10-CM | POA: Diagnosis not present

## 2019-09-12 DIAGNOSIS — R0609 Other forms of dyspnea: Secondary | ICD-10-CM

## 2019-09-14 ENCOUNTER — Other Ambulatory Visit: Payer: Self-pay | Admitting: Family Medicine

## 2019-09-19 DIAGNOSIS — R002 Palpitations: Secondary | ICD-10-CM | POA: Diagnosis not present

## 2019-09-21 ENCOUNTER — Ambulatory Visit (INDEPENDENT_AMBULATORY_CARE_PROVIDER_SITE_OTHER): Payer: Medicare Other

## 2019-09-21 ENCOUNTER — Other Ambulatory Visit: Payer: Self-pay

## 2019-09-21 DIAGNOSIS — R0609 Other forms of dyspnea: Secondary | ICD-10-CM

## 2019-09-21 DIAGNOSIS — R0600 Dyspnea, unspecified: Secondary | ICD-10-CM

## 2019-09-22 ENCOUNTER — Telehealth: Payer: Self-pay | Admitting: Cardiology

## 2019-09-22 NOTE — Telephone Encounter (Signed)
Patient calling to discuss recent echo  testing results .  She cannot access mychart results   Please call

## 2019-09-22 NOTE — Telephone Encounter (Signed)
I spoke with the patient regarding her normal echo results. She is aware her monitor has been uploaded and we are awaiting review by her provider. She has a follow up appointment on 10/13 with Ignacia Bayley, NP. She is aware the results of her monitor will be discussed in detail with her at that time.   She voices understanding and is agreeable.

## 2019-09-26 NOTE — Progress Notes (Signed)
Cardiology Clinic Note   Patient Name: Nirvi Boehler Date of Encounter: 09/27/2019  Primary Care Provider:  Ria Bush, MD Primary Cardiologist:  Kate Sable, MD  Patient Profile    78 year old female with a reported history of rheumatic fever at age 40, mild valvular disease, hyperlipidemia, iron deficiency anemia without history of GI bleed, GERD, migraines, and palpitations, who presents for follow-up after recent finding of atrial fibrillation on event monitoring.  Past Medical History    Past Medical History:  Diagnosis Date  . Diverticulosis   . DJD (degenerative joint disease) of knee   . GERD (gastroesophageal reflux disease)   . H/o Iron deficiency anemia 2009   a. 05/2008 EGD/Colonoscopy: nl w/o evidence of bleeding; b. 11/2017 EGD: nl. Colonoscopy: 22mm cecal polyp, otw nl.  . H/O transfusion of whole blood 2009  . H/O: rheumatic fever    78 years old  . History of chicken pox   . History of echocardiogram    a. 09/2019 Echo: EF 60-65%, nl RV size/fxn. Nl Atrial sizes. Triv TR. Mild AI.  Marland Kitchen Hx of basal cell carcinoma    multiple sites  . 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 Hyperlipidemia   . Migraines   . PAF (paroxysmal atrial fibrillation) ([place])    a. 08/2019 Zio: Avg HR 60 (min 43, max 144). PAF (2% burden), longest 1h 76m (avg rate 77). Rare PAC's and PVC's.  . Palpitation   . Pelvic prolapse    with rectocele  . Rosacea   . Seasonal allergic rhinitis    mild  . Stress incontinence    Past Surgical History:  Procedure Laterality Date  . ABDOMINAL HYSTERECTOMY  1981  . APPENDECTOMY  1960  . capsule endoscopy  2009   WNL  . CATARACT EXTRACTION Bilateral [id number]  . COLONOSCOPY  2009   diverticulosis ([place])  . COLONOSCOPY WITH PROPOFOL N/A 12/01/2017   TA, rpt 5 yrs ([facility])  . DEXA  06/2016   T -0.2 hip, 0.7 spine  . ESOPHAGOGASTRODUODENOSCOPY  2009   focal mild chronic gastritis, neg H pylori  ([place])  . ESOPHAGOGASTRODUODENOSCOPY (EGD) WITH PROPOFOL N/A 12/01/2017   Procedure: ESOPHAGOGASTRODUODENOSCOPY (EGD) WITH PROPOFOL;  Surgeon: Lucilla Lame, MD;  Location[facility] ENDOSCOPY;  Service: Endoscopy;  Laterality: N/A;  . INCONTINENCE SURGERY  2001   with mesh  . LIPOMA EXCISION  1971   right shoulder  . [place]  . OVARIAN CYST REMOVAL  1960  . SALPINGOOPHORECTOMY Left 2001  . TONSILLECTOMY  1958    Allergies  Allergies  Allergen Reactions  . Aspirin Other (See Comments)    GI bleed  . Baclofen Diarrhea  . Codeine Nausea And Vomiting  . Imitrex  [Sumatriptan]     Palpatations  . Influenza Vaccines Swelling  . Vicodin  [Hydrocodone-Acetaminophen] Nausea And Vomiting    History of Present Illness    78 year old female with the above past medical history including rheumatic fever at age 36, prior report of moderate mitral regurgitation, iron deficiency anemia without history of GI bleed, GERD, migraines, and a long history of palpitations for which, she has been treated with metoprolol therapy.  She has no prior history of chest pain or dyspnea and has lead an active lifestyle until her husband became sick with Parkinson's.  They have traveled the world and have done a significant amount of hiking through a multitude of mountain ranges.  In 2009,  she was diagnosed with severe iron deficiency anemia.  EGD and colonoscopy in 2009 were negative for bleeding.  She was on aspirin at the time and that was discontinued.  More recently, EGD in December 2018 was normal with a colonoscopy that showed a single cecal polyp.  As noted, she has a history of palpitations and was followed previously by [facility].  An echocardiogram in 2011 showed normal LV function with moderate mitral regurgitation and mild TR/AI.  She recently established care in our clinic in September with a 1 month history of increasing palpitations associated with occasional dizziness, dyspnea, and or  fatigue.  An echocardiogram was performed and showed normal LV function with no evidence of mitral regurgitation.  Trivial TR/AI were noted.  A ZIO monitor was placed and showed paroxysmal atrial fibrillation with a 2% burden.  The longest episode was 1 hour and 23 minutes.  She presents today for follow-up.  We reviewed her monitor today.  Of note, many of her reported symptoms were associated with A. fib while others, such as neck pain or dyspnea were more likely to be associated with sinus rhythm.  She says that she seems to notice episodes of palpitations more likely when she is lying down for bed at night.  She is aware that she snores some but does not think it is often.  She has never undergone a sleep study.  She denies any significant alcohol use.  No chest pain, PND, orthopnea, syncope, edema, or early satiety.  Home Medications    Prior to Admission medications   Medication Sig Start Date End Date Taking? Authorizing Provider  atorvastatin (LIPITOR) 40 MG tablet TAKE 1 TABLET DAILY 09/14/19  Yes Ria Bush, MD  B COMPLEX VITAMINS PO Take by mouth daily.   Yes [provider]  Calcium Citrate (CITRACAL PO) Take 1 tablet by mouth at bedtime.   Yes [provider]  chlorhexidine (PERIDEX) 0.12 % solution as needed.  11/10/17  Yes [provider]  cycloSPORINE (RESTASIS) 0.05 % ophthalmic emulsion Apply 0.05 drops to eye 2 (two) times daily.   Yes [provider]  diphenoxylate-atropine (LOMOTIL) 2.5-0.025 MG tablet Take 1 tablet by mouth 4 (four) times daily. Patient taking differently: Take 1 tablet by mouth 4 (four) times daily as needed.  04/19/18  Yes Lucilla Lame, MD  glucosamine-chondroitin 500-400 MG tablet Take 1 tablet by mouth 2 (two) times daily.   Yes [provider]  Methylcellulose, Laxative, (FIBER THERAPY PO) Take 3 tablets by mouth daily.   Yes [provider]  MULTIPLE VITAMIN PO Take by mouth daily.   Yes Roselee Nova, MD  OMEGA-3 FATTY ACIDS PO Take 1,200 mg by mouth daily.  02/27/14  Yes Roselee Nova, MD  omeprazole (PRILOSEC) 40 MG capsule Take 1 capsule (40 mg total) by mouth 2 (two) times a day. Patient taking differently: Take 40 mg by mouth daily.  06/14/19  Yes Lucilla Lame, MD  Probiotic Product (PROBIOTIC PO) Take 1 tablet by mouth daily.   Yes [provider]  rizatriptan (MAXALT) 10 MG tablet Take 10 mg by mouth daily as needed.   Yes Roselee Nova, MD  topiramate (TOPAMAX) 100 MG tablet Take 100 mg by mouth 2 (two) times daily.   Yes [provider]  TOPROL XL 25 MG 24 hr tablet TAKE 1 TABLET DAILY 09/14/19  Yes Ria Bush, MD  apixaban (ELIQUIS) 5 MG TABS tablet Take 1 tablet (5  mg total) by mouth 2 (two) times daily. 09/27/19   Theora Gianotti, NP   Review of Systems    General:  No chills, fever, night sweats or weight changes.  Occasional fatigue. Cardiovascular:  No chest pain, dyspnea on exertion, edema, orthopnea, +++ palpitations, no paroxysmal nocturnal dyspnea.  Occasional lightheadedness. Dermatological: No rash, lesions/masses Respiratory: No cough, dyspnea Urologic: No hematuria, dysuria Abdominal:   No nausea, vomiting, diarrhea, bright red blood per rectum, melena, or hematemesis Neurologic:  No visual changes, wkns, changes in mental status. All other systems reviewed and are otherwise negative except as noted above.  Physical Exam    VS:  BP 130/80 (BP Location: Left Arm, Patient Position: Sitting, Cuff Size: Normal)   Pulse (!) 58   Temp (!) 96.8 F (36 C)   Ht 5\' 4"  (1.626 m)   Wt 159 lb 8 oz (72.3 kg)   SpO2 96%   BMI 27.38 kg/m  , BMI Body mass index is 27.38 kg/m. GEN: Well nourished, well developed, in no acute distress. HEENT: normal. Neck: Supple, no JVD, carotid bruits, or masses. Cardiac: RRR, no murmurs, rubs, or gallops. No clubbing, cyanosis, edema.  Radials/PT 2+ and equal bilaterally.  Respiratory:   Respirations regular and unlabored, clear to auscultation bilaterally. GI: Soft, nontender, nondistended, BS + x 4. MS: no deformity or atrophy. Skin: warm and dry, no rash. Neuro:  Strength and sensation are intact. Psych: Normal affect.  Accessory Clinical Findings    ECG personally reviewed by me today-sinus bradycardia, 58, delayed R wave progression.  QTc 396 ms- No acute changes  Lab Results  Component Value Date   WBC 4.2 07/21/2019   HGB 14.9 07/21/2019   HCT 44.6 07/21/2019   MCV 92.9 07/21/2019   PLT 146.0 (L) 07/21/2019   Lab Results  Component Value Date   CREATININE 0.77 07/21/2019   BUN 14 07/21/2019   NA 141 07/21/2019   K 4.0 07/21/2019   CL 109 07/21/2019   CO2 25 07/21/2019   Lab Results  Component Value Date   ALT 23 07/21/2019   AST 26 07/21/2019   ALKPHOS 93 07/21/2019   BILITOT 0.5 07/21/2019   Lab Results  Component Value Date   CHOL 159 07/21/2019   HDL 42.80 07/21/2019   LDLCALC 85 07/21/2019   TRIG 157.0 (H) 07/21/2019   CHOLHDL 4 07/21/2019     Assessment & Plan   1.  Paroxysmal atrial fibrillation: Patient has a long history of palpitations which worsened over the past 2 months.  Recent event monitoring did in fact show paroxysmal atrial fibrillation with rates as high as 144 bpm.  In all, there was only a 2% burden.  The longest episode lasted 1 hour and 23 minutes.  Her CHA2DS2VASc equals 3.  Though she has a history of iron deficiency anemia dating back to 2009, GI work-up never revealed bleeding.  She was on aspirin in 2009 which was discontinued.  We discussed initiation of oral anticoagulation today and she is agreeable to start Eliquis 5 mg twice daily.  She remains bothered by palpitations and A. fib.  She is currently on Toprol-XL 25 mg daily.  On this dose, she is bradycardic in sinus rhythm at a rate of 58, and thus I cannot titrate.  She is interested in pursuing referral to A. fib clinic for possible antiarrhythmic initiation.   Finally, she does admit to a history of snoring and I will refer for sleep study.  2.  Snoring:  Refer for sleep study.  3.  Hyperlipidemia: LDL of 85 in August.  Continue statin therapy.  4.  Disposition: We discussed her monitor, diagnosis, and plan of care for 45 minutes today.  Referring to A. fib clinic and also for a sleep study.  Follow-up CBC and basic metabolic panel in 1 month given initiation of Eliquis therapy.  Murray Hodgkins, NP 09/27/2019, 5:43 PM

## 2019-09-27 ENCOUNTER — Ambulatory Visit (INDEPENDENT_AMBULATORY_CARE_PROVIDER_SITE_OTHER): Payer: Medicare Other | Admitting: Nurse Practitioner

## 2019-09-27 ENCOUNTER — Other Ambulatory Visit: Payer: Self-pay

## 2019-09-27 ENCOUNTER — Encounter: Payer: Self-pay | Admitting: Nurse Practitioner

## 2019-09-27 VITALS — BP 130/80 | HR 58 | Temp 96.8°F | Ht 64.0 in | Wt 159.5 lb

## 2019-09-27 DIAGNOSIS — R0600 Dyspnea, unspecified: Secondary | ICD-10-CM | POA: Diagnosis not present

## 2019-09-27 DIAGNOSIS — I480 Paroxysmal atrial fibrillation: Secondary | ICD-10-CM

## 2019-09-27 DIAGNOSIS — R0609 Other forms of dyspnea: Secondary | ICD-10-CM

## 2019-09-27 MED ORDER — APIXABAN 5 MG PO TABS: 5.0000 mg | ORAL_TABLET | Freq: Two times a day (BID) | ORAL | 6 refills | Status: DC

## 2019-09-27 NOTE — Patient Instructions (Signed)
Medication Instructions:  1- START Eliquis Take 1 tablet (5 mg total) by mouth 2 (two) times daily. If you need a refill on your cardiac medications before your next appointment, please call your pharmacy.   Lab work: None ordered  If you have labs (blood work) drawn today and your tests are completely normal, you will receive your results only by: Marland Kitchen MyChart Message (if you have MyChart) OR . A paper copy in the mail If you have any lab test that is abnormal or we need to change your treatment, we will call you to review the results.  Testing/Procedures: None ordered   Follow-Up: At [facility], you and your health needs are our priority.  As part of our continuing mission to provide you with exceptional heart care, we have created designated Provider Care Teams.  These Care Teams include your primary Cardiologist (physician) and Advanced Practice Providers (APPs -  Physician Assistants and Nurse Practitioners) who all work together to provide you with the care you need, when you need it. You will need a follow up appointment in 6 weeks.  Please see Kate Sable, MD.  1- Ref placed to atrial fib clinic 2- Ref placed for pulmonology- for sleep study.

## 2019-09-28 IMAGING — MR MR SHOULDER*L* W/O CM
6 series · 40 of 40 positions shown · non-contrast
Comparison: Radiographs 11/25/2017.

CLINICAL DATA: 76-year-old with left shoulder pain and decreased
range of motion for 6 months. No acute injury or prior relevant
surgery.

EXAM:
MRI OF THE LEFT SHOULDER WITHOUT CONTRAST
TECHNIQUE: Multiplanar, multisequence MR imaging of the shoulder was performed.
No intravenous contrast was administered.

[Series 3: T2 fat-sat · axial · 4.0mm · 0.55mm/px · z∈[-27,+74]mm · 7 of 24 slices shown (1 of 4)]
[im 1/24]
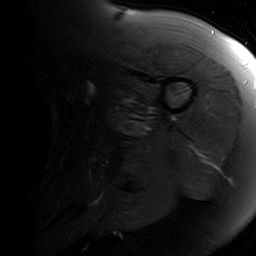
[im 4/24]
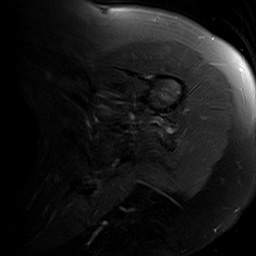
[im 8/24]
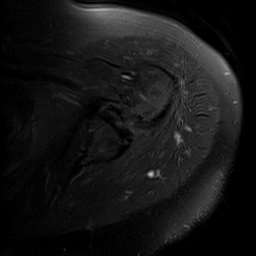
[im 12/24]
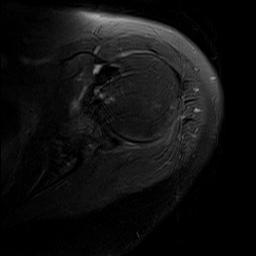
[im 16/24]
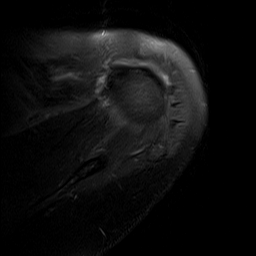
[im 20/24]
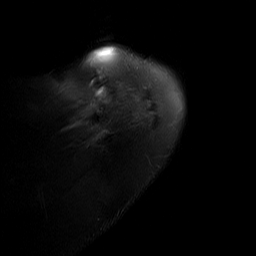
[im 24/24]
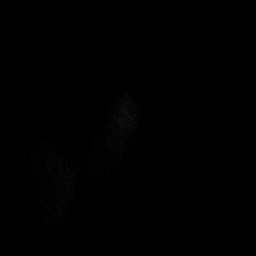

[Series 4: PD · oblique · 4.0mm · 0.62mm/px · 5 of 19 slices shown]
[im 1/19]
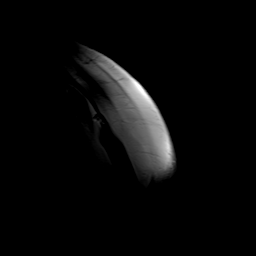
[im 5/19]
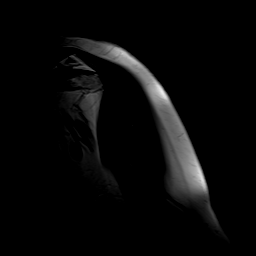
[im 10/19]
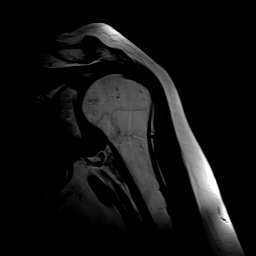
[im 14/19]
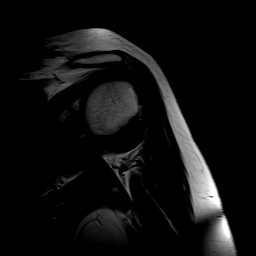
[im 19/19]
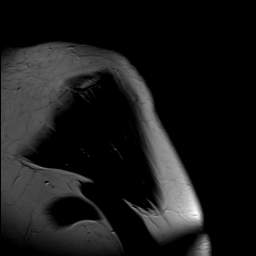

[Series 5: T2 fat-sat · oblique · 4.0mm · 0.62mm/px · 6 of 19 slices shown (2 of 4)]
[im 1/19]
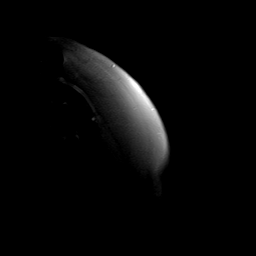
[im 4/19]
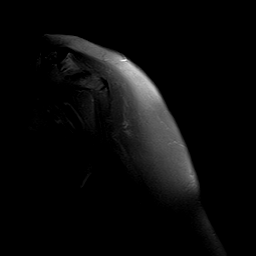
[im 8/19]
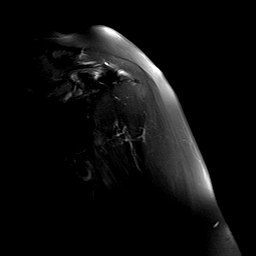
[im 11/19]
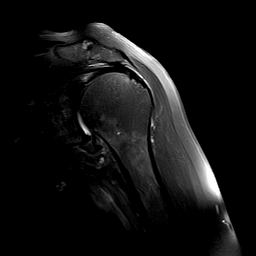
[im 15/19]
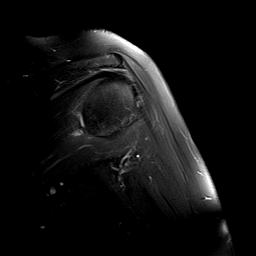
[im 19/19]
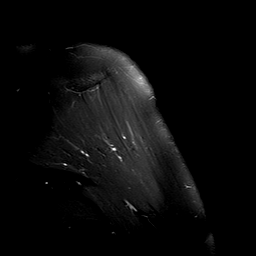

[Series 6: T2 fat-sat · axial · 4.0mm · 0.55mm/px · z∈[-27,+74]mm · 8 of 24 slices shown (3 of 4)]
[im 1/24]
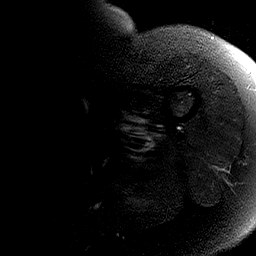
[im 4/24]
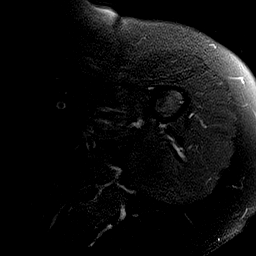
[im 7/24]
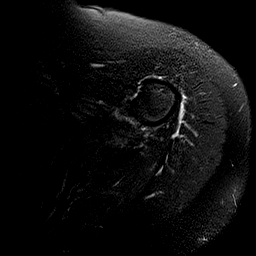
[im 10/24]
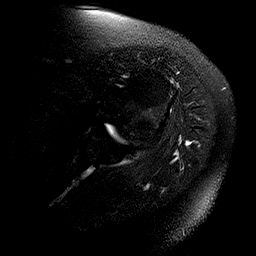
[im 14/24]
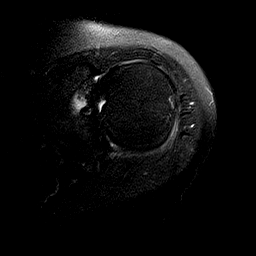
[im 17/24]
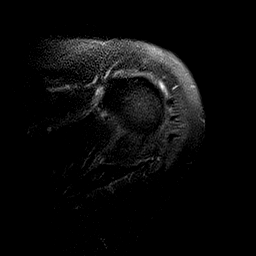
[im 20/24]
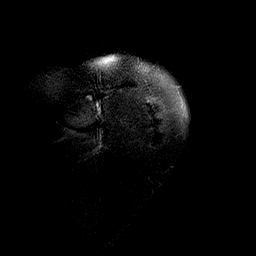
[im 24/24]
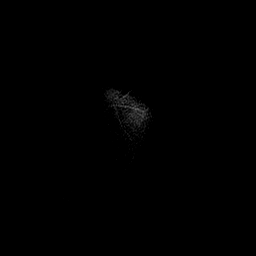

[Series 7: T1 · oblique · 4.0mm · 0.62mm/px · 7 of 21 slices shown]
[im 1/21]
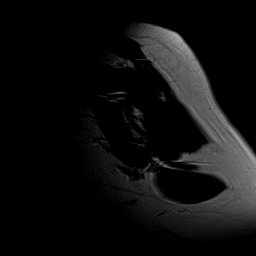
[im 4/21]
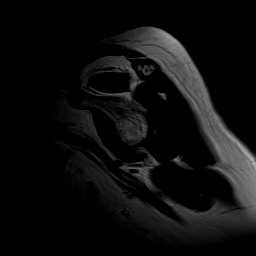
[im 7/21]
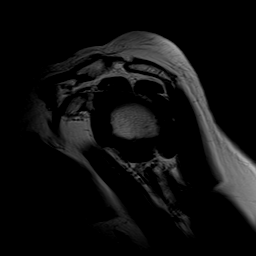
[im 11/21]
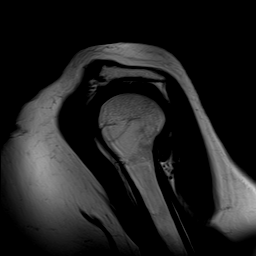
[im 14/21]
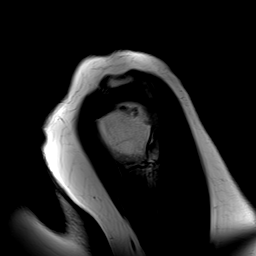
[im 17/21]
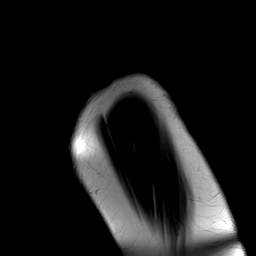
[im 21/21]
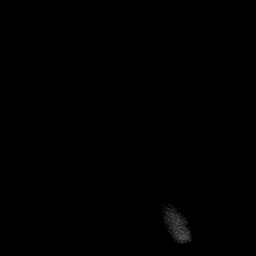

[Series 8: T2 fat-sat · oblique · 4.0mm · 0.62mm/px · 7 of 21 slices shown (4 of 4)]
[im 1/21]
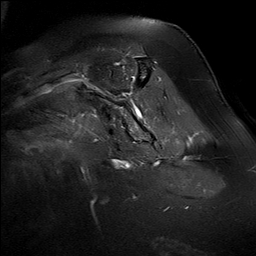
[im 4/21]
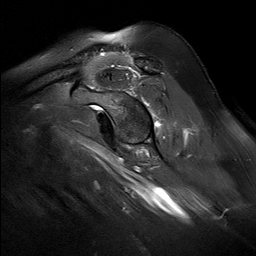
[im 7/21]
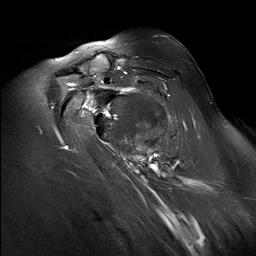
[im 11/21]
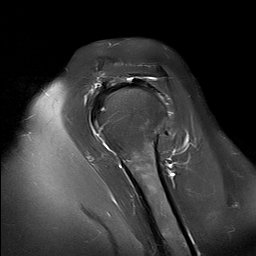
[im 14/21]
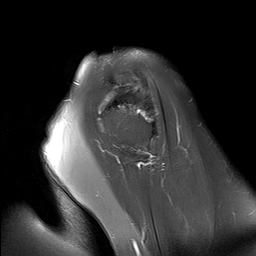
[im 17/21]
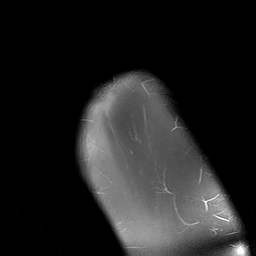
[im 21/21]
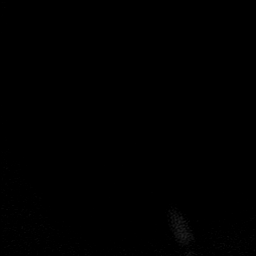

[40 of 40 positions shown; findings below may reference images not displayed]

FINDINGS: Despite efforts by the technologist and patient, mild motion
artifact is present on today's exam and could not be eliminated.
This reduces exam sensitivity and specificity. Motion is greatest on
the axial images, despite repeating this series.

Rotator cuff:  Intact without significant tendinosis.

Muscles:  No focal muscular atrophy or edema.

Biceps long head:  Intact and normally positioned.

Acromioclavicular Joint: The acromion is type 2. There are mild
acromioclavicular degenerative changes. Trace fluid in the
subacromial -subdeltoid bursa.

Glenohumeral Joint: No significant shoulder joint effusion or
glenohumeral arthropathy. There is possible mild thickening and
edema of the joint capsule in the axillary recess, as can be seen
with adhesive capsulitis.

Labrum:  No evidence of labral tear or paralabral cyst.

Bones: No acute or significant extra-articular osseous findings.

Other: No significant soft tissue findings.
IMPRESSION: 1. Mild joint capsular thickening and edema as can be seen with
adhesive capsulitis.
2. No evidence of rotator cuff, labral or biceps tendon tear.
3. Mild acromioclavicular degenerative changes.

## 2019-09-30 ENCOUNTER — Ambulatory Visit (HOSPITAL_COMMUNITY)
Admission: RE | Admit: 2019-09-30 | Discharge: 2019-09-30 | Disposition: A | Discharge status: Home / Self Care | Payer: Medicare Other | Source: Ambulatory Visit | Attending: Nurse Practitioner | Admitting: Nurse Practitioner

## 2019-09-30 ENCOUNTER — Encounter (HOSPITAL_COMMUNITY): Payer: Self-pay | Admitting: Nurse Practitioner

## 2019-09-30 ENCOUNTER — Other Ambulatory Visit: Payer: Self-pay

## 2019-09-30 VITALS — BP 148/78 | HR 61 | Ht 64.0 in | Wt 159.6 lb

## 2019-09-30 DIAGNOSIS — I480 Paroxysmal atrial fibrillation: Secondary | ICD-10-CM | POA: Diagnosis not present

## 2019-09-30 DIAGNOSIS — Z7901 Long term (current) use of anticoagulants: Secondary | ICD-10-CM | POA: Insufficient documentation

## 2019-09-30 DIAGNOSIS — Z79899 Other long term (current) drug therapy: Secondary | ICD-10-CM | POA: Diagnosis not present

## 2019-09-30 DIAGNOSIS — R0600 Dyspnea, unspecified: Secondary | ICD-10-CM | POA: Diagnosis not present

## 2019-09-30 DIAGNOSIS — D509 Iron deficiency anemia, unspecified: Secondary | ICD-10-CM | POA: Insufficient documentation

## 2019-09-30 DIAGNOSIS — K219 Gastro-esophageal reflux disease without esophagitis: Secondary | ICD-10-CM | POA: Diagnosis not present

## 2019-09-30 DIAGNOSIS — Z87891 Personal history of nicotine dependence: Secondary | ICD-10-CM | POA: Insufficient documentation

## 2019-09-30 DIAGNOSIS — E785 Hyperlipidemia, unspecified: Secondary | ICD-10-CM | POA: Insufficient documentation

## 2019-09-30 DIAGNOSIS — M1710 Unilateral primary osteoarthritis, unspecified knee: Secondary | ICD-10-CM | POA: Diagnosis not present

## 2019-09-30 DIAGNOSIS — I091 Rheumatic diseases of endocardium, valve unspecified: Secondary | ICD-10-CM | POA: Insufficient documentation

## 2019-09-30 DIAGNOSIS — M549 Dorsalgia, unspecified: Secondary | ICD-10-CM | POA: Diagnosis not present

## 2019-09-30 DIAGNOSIS — R5383 Other fatigue: Secondary | ICD-10-CM | POA: Insufficient documentation

## 2019-09-30 DIAGNOSIS — Z8249 Family history of ischemic heart disease and other diseases of the circulatory system: Secondary | ICD-10-CM | POA: Insufficient documentation

## 2019-09-30 NOTE — Progress Notes (Signed)
Primary Care Physician: Ria Bush, MD Referring Physician: Ignacia Bayley, NP   Alexandria Moreno is a 78 y.o. female with a h/o rheumatic fever at age 66, mild valvular disease, hyperlipidemia, iron deficiency anemia without history of GI bleed, GERD, migraines, and palpitations, who presents for follow-up after recent finding of atrial fibrillation on event monitoring.  She has been on Toprol XL 25 mg daily, for years, HR's in the 60's at baseline. Review of her monitor  shows 2% afib burden, with longest episode  one hour duration, mostly rate controlled below 100 bpm. She was started on eliquis 5 mg bid for CHA2DS2VASc score of 3.  She also tells me that she is experiencing  more  fatigue than  she has had in the past. She also describes exertional  left lateral  neck pain and upper back discomfort when walking with her husband that is new over the last several weeks. Since these episodes are daily they do not  seem to correlate with afib episodes. She has soft snoring but no apnea per husband so she feels that she does not need a sleep study as suggested by [employer].   Today, she denies symptoms of palpitations, chest pain, shortness of breath, orthopnea, PND, lower extremity edema, dizziness, presyncope, syncope, or neurologic sequela. The patient is tolerating medications without difficulties and is otherwise without complaint today.   Past Medical History:  Diagnosis Date   Diverticulosis    DJD (degenerative joint disease) of knee    GERD (gastroesophageal reflux disease)    H/o Iron deficiency anemia 2009   a. 05/2008 EGD/Colonoscopy: nl w/o evidence of bleeding; b. 11/2017 EGD: nl. Colonoscopy: 75mm cecal polyp, otw nl.   H/O transfusion of whole blood 2009   H/O: rheumatic fever    78 years old   History of chicken pox    History of echocardiogram    a. 09/2019 Echo: EF 60-65%, nl RV size/fxn. Nl Atrial sizes. Triv TR. Mild AI.   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 08/2019 Zio: Avg HR 60 (min 43, max 144). PAF (2% burden), longest 1h 22m (avg rate 77). Rare PAC's and PVC's.   Palpitation    Pelvic prolapse    with rectocele   Rosacea    Seasonal allergic rhinitis    mild   Stress incontinence    Past Surgical History:  Procedure Laterality Date   ABDOMINAL HYSTERECTOMY  1981   APPENDECTOMY  1960   capsule endoscopy  2009   WNL   CATARACT EXTRACTION Bilateral [id number]   COLONOSCOPY  2009   diverticulosis ([place])   COLONOSCOPY WITH PROPOFOL N/A 12/01/2017   TA, rpt 5 yrs ([facility])   DEXA  06/2016   T -0.2 hip, 0.7 spine   ESOPHAGOGASTRODUODENOSCOPY  2009   focal mild chronic gastritis, neg H pylori ([place])   ESOPHAGOGASTRODUODENOSCOPY (EGD) WITH PROPOFOL N/A 12/01/2017   Procedure: ESOPHAGOGASTRODUODENOSCOPY (EGD) WITH PROPOFOL;  Surgeon: Lucilla Lame, MD;  Location: [facility] ENDOSCOPY;  Service: Endoscopy;  Laterality: N/A;   INCONTINENCE SURGERY  2001   with mesh   LIPOMA EXCISION  1971   right shoulder   [place]   SALPINGOOPHORECTOMY Left 2001   [place]  Current Outpatient Medications  Medication Sig Dispense Refill   apixaban (ELIQUIS) 5 MG TABS tablet Take 1 tablet (5 mg total) by mouth 2 (two) times daily. 60 tablet 6   atorvastatin (LIPITOR) 40 MG tablet TAKE 1 TABLET DAILY 90 tablet 3   B COMPLEX VITAMINS PO Take by mouth daily.     Calcium Citrate (CITRACAL PO) Take 1 tablet by mouth at bedtime.     chlorhexidine (PERIDEX) 0.12 % solution as needed.   0   cycloSPORINE (RESTASIS) 0.05 % ophthalmic emulsion Apply 0.05 drops to eye 2 (two) times daily.     diphenoxylate-atropine (LOMOTIL) 2.5-0.025 MG tablet Take 1 tablet by mouth 4 (four) times daily. (Patient taking differently: Take 1  tablet by mouth as needed. ) 360 tablet 3   glucosamine-chondroitin 500-400 MG tablet Take 1 tablet by mouth 2 (two) times daily.     Methylcellulose, Laxative, (FIBER THERAPY PO) Take 3 tablets by mouth daily.     MULTIPLE VITAMIN PO Take by mouth daily.     OMEGA-3 FATTY ACIDS PO Take 1,200 mg by mouth daily.      omeprazole (PRILOSEC) 40 MG capsule Take 1 capsule (40 mg total) by mouth 2 (two) times a day. (Patient taking differently: Take 40 mg by mouth daily. ) 180 capsule 3   Probiotic Product (PROBIOTIC PO) Take 1 tablet by mouth daily.     rizatriptan (MAXALT) 10 MG tablet Take 10 mg by mouth daily as needed.     topiramate (TOPAMAX) 100 MG tablet Take 100 mg by mouth 2 (two) times daily.     TOPROL XL 25 MG 24 hr tablet TAKE 1 TABLET DAILY 90 tablet 3   No current facility-administered medications for this encounter.     Allergies  Allergen Reactions   Aspirin Other (See Comments)    GI bleed   Baclofen Diarrhea   Codeine Nausea And Vomiting   Imitrex  [Sumatriptan]     Palpatations   Influenza Vaccines Swelling   Vicodin  [Hydrocodone-Acetaminophen] Nausea And Vomiting    Social History   Socioeconomic History   Marital status: Married    Spouse name: Not on file   Number of children: Not on file   Years of education: Not on file   Highest education level: Not on file  Occupational History   Not on file  Social Needs   Financial resource strain: Not on file   Food insecurity    Worry: Not on file    Inability: Not on file   Transportation needs    Medical: Not on file    Non-medical: Not on file  Tobacco Use   Smoking status: Former Smoker    Quit date: 12/15/1978    Years since quitting: 40.8   Smokeless tobacco: Never Used  Substance and Sexual Activity   Alcohol use: No    Alcohol/week: 0.0 standard drinks    Comment: Rare   Drug use: No   Sexual activity: Never  Lifestyle   Physical activity    Days per week: Not on  file    Minutes per session: Not on file   Stress: Not on file  Relationships   Social connections    Talks on phone: Not on file    Gets together: Not on file    Attends religious service: Not on file    Active member of club or organization: Not on file    Attends meetings of clubs or organizations: Not on file  Relationship status: Not on file   Intimate partner violence    Fear of current or ex partner: Not on file    Emotionally abused: Not on file    Physically abused: Not on file    Forced sexual activity: Not on file  Other Topics Concern   Not on file  Social History Narrative   Lives with husband    Occupation: retired, was Art therapist   Edu: some college   Activity: stationary bicycle   Diet: good water, following slim fast diet, good vegetables    Family History  Problem Relation Age of Onset   Stroke Mother 47   Cancer Mother        Cervical   Depression Mother    Dementia Mother        vascular   Heart disease Father        CABG   Alcohol abuse Father    Osteoporosis Father    Heart attack Father    Hypertension Brother    Heart attack Brother    Stroke Brother    Stroke Brother 63   Heart attack Brother    Stroke Paternal Grandfather    Alzheimer's disease Maternal Grandfather    Breast cancer Neg Hx     ROS- All systems are reviewed and negative except as per the HPI above  Physical Exam: Vitals:   09/30/19 0911  BP: (!) 148/78  Pulse: 61  Weight: 72.4 kg  Height: 5\' 4"  (1.626 m)   Wt Readings from Last 3 Encounters:  09/30/19 72.4 kg  09/27/19 72.3 kg  08/29/19 72.1 kg    Labs: Lab Results  Component Value Date   NA 141 07/21/2019   K 4.0 07/21/2019   CL 109 07/21/2019   CO2 25 07/21/2019   GLUCOSE 81 07/21/2019   BUN 14 07/21/2019   CREATININE 0.77 07/21/2019   CALCIUM 9.2 07/21/2019   MG 2.2 07/21/2019   No results found for: INR Lab Results  Component Value Date   CHOL 159 07/21/2019   HDL  42.80 07/21/2019   LDLCALC 85 07/21/2019   TRIG 157.0 (H) 07/21/2019     GEN- The patient is well appearing, alert and oriented x 3 today.   Head- normocephalic, atraumatic Eyes-  Sclera clear, conjunctiva pink Ears- hearing intact Oropharynx- clear Neck- supple, no JVP Lymph- no cervical lymphadenopathy Lungs- Clear to ausculation bilaterally, normal work of breathing Heart- Regular rate and rhythm, no murmurs, rubs or gallops, PMI not laterally displaced GI- soft, NT, ND, + BS Extremities- no clubbing, cyanosis, or edema MS- no significant deformity or atrophy Skin- no rash or lesion Psych- euthymic mood, full affect Neuro- strength and sensation are intact  EKG-NSR at 61 bpm, pr int 194 ms, qrs int 82 ms, qtc 398 ms Epic records reviewed    Assessment and Plan: 1.Paroxosysmal afib Right  now burden is low Cannot up titrate BB as HR is in the 60's at baseline General education re afib Discussed if she desired antiarrythmic therapy would suggest Multaq or flecainide I do not recommend Multaq as most Medicare pt's cannot afford, but she has [facility] so cost should not be an issue If flecainide is used, she will need a stress test to r/o CAD  Pt/husband deny apnea so pt is wanting to defer sleep study  2. CHA2DS2VASc score of 3 Continue eliquis 5 mg bid  Bleeding precautions  discussed   3. Fatigue/exertional neck/uipper back pain, mild dyspnea  I will  send my note to Gerald Stabs to see if he feels she needs a stress test with these symptoms I doubt afib is contributing  I will need to know if she has CAD  if flecainide is used anyway  I will wait to hear from results from  stress test  and then will discuss further  with pt AAD therapy  Butch Penny C. Sanvika Cuttino, [facility] [address] [place], [place] [address] [phone]

## 2019-10-03 ENCOUNTER — Telehealth: Payer: Self-pay | Admitting: *Deleted

## 2019-10-03 DIAGNOSIS — I4891 Unspecified atrial fibrillation: Secondary | ICD-10-CM

## 2019-10-03 NOTE — Telephone Encounter (Signed)
-----   Message from Theora Gianotti, NP sent at 10/02/2019  7:26 PM EDT ----- Hi,  Would you pls arrange a lexiscan MV for Ms. Craigie - request from Afib clinic to help guide antiarrhythmic choice.  Thanks,  Gerald Stabs ----- Message ----- From: Sherran Needs, NP Sent: 09/30/2019  11:31 AM EDT To: Theora Gianotti, NP  [place], Alexandria Moreno was describing fatigue and exertional symptoms that do not correlate to afib. Could you arrange for a stress test? I will need to know this anyway as I gave her options of Multaq or flecainide.  Thanks, Butch Penny

## 2019-10-04 DIAGNOSIS — L820 Inflamed seborrheic keratosis: Secondary | ICD-10-CM | POA: Diagnosis not present

## 2019-10-04 DIAGNOSIS — L570 Actinic keratosis: Secondary | ICD-10-CM | POA: Diagnosis not present

## 2019-10-04 NOTE — Telephone Encounter (Signed)
No ans

## 2019-10-04 NOTE — Telephone Encounter (Signed)
Order placed for [facility] as requested. Pt made aware by phone.  She is currently having issues with mychart so I will send directions via mail.   Message routed to scheduling to set up appt. [place]  Your caregiver has ordered a Stress Test with nuclear imaging. The purpose of this test is to evaluate the blood supply to your heart muscle. This procedure is referred to as a "Non-Invasive Stress Test." This is because other than having an IV started in your vein, nothing is inserted or "invades" your body. Cardiac stress tests are done to find areas of poor blood flow to the heart by determining the extent of coronary artery disease (CAD). Some patients exercise on a treadmill, which naturally increases the blood flow to your heart, while others who are  unable to walk on a treadmill due to physical limitations have a pharmacologic/chemical stress agent called Lexiscan . This medicine will mimic walking on a treadmill by temporarily increasing your coronary blood flow.   Please note: these test may take anywhere between 2-4 hours to complete  PLEASE REPORT TO [place] AT THE FIRST DESK WILL DIRECT YOU WHERE TO GO  Date of Procedure:_____________________________________  Arrival Time for Procedure:______________________________  Instructions regarding medication:   __x__:  Hold betablocker(s) night before procedure and morning of procedure (Toprol)  ______________  PLEASE NOTIFY THE OFFICE AT LEAST 24 HOURS IN ADVANCE IF YOU ARE UNABLE TO KEEP YOUR APPOINTMENT.  [phone] AND  PLEASE NOTIFY NUCLEAR MEDICINE AT [facility] AT LEAST 24 HOURS IN ADVANCE IF YOU ARE UNABLE TO KEEP YOUR APPOINTMENT. [phone]  How to prepare for your Myoview test:  1. Do not eat or drink after midnight 2. No caffeine for 24 hours prior to test 3. No smoking 24 hours prior to test. 4. Your medication may be taken with water.  If your doctor stopped a medication because of  this test, do not take that medication. 5. Ladies, please do not wear dresses.  Skirts or pants are appropriate. Please wear a short sleeve shirt. 6. No perfume, cologne or lotion. 7. Wear comfortable walking shoes. No heels!

## 2019-10-06 NOTE — Addendum Note (Signed)
Addended by: Verlon Au on: 10/06/2019 03:38 PM   Modules accepted: Orders

## 2019-10-20 ENCOUNTER — Encounter: Payer: Self-pay | Admitting: Pulmonary Disease

## 2019-10-20 ENCOUNTER — Other Ambulatory Visit: Payer: Self-pay

## 2019-10-20 ENCOUNTER — Ambulatory Visit (INDEPENDENT_AMBULATORY_CARE_PROVIDER_SITE_OTHER): Payer: Medicare Other | Admitting: Pulmonary Disease

## 2019-10-20 VITALS — BP 136/76 | HR 62 | Temp 97.9°F | Ht 64.0 in | Wt 159.6 lb

## 2019-10-20 DIAGNOSIS — R05 Cough: Secondary | ICD-10-CM | POA: Diagnosis not present

## 2019-10-20 DIAGNOSIS — K219 Gastro-esophageal reflux disease without esophagitis: Secondary | ICD-10-CM

## 2019-10-20 DIAGNOSIS — R0602 Shortness of breath: Secondary | ICD-10-CM

## 2019-10-20 DIAGNOSIS — R059 Cough, unspecified: Secondary | ICD-10-CM

## 2019-10-20 DIAGNOSIS — I480 Paroxysmal atrial fibrillation: Secondary | ICD-10-CM | POA: Diagnosis not present

## 2019-10-20 MED ORDER — SPIRIVA RESPIMAT 2.5 MCG/ACT IN AERS: 2.0000 | INHALATION_SPRAY | Freq: Every day | RESPIRATORY_TRACT | 0 refills | Status: DC

## 2019-10-20 NOTE — Progress Notes (Signed)
Subjective:    Patient ID: Alexandria Moreno, female    DOB: [birth year], 78 y.o.   MRN: [id number]  HPI Patient is a 78 year old remote former smoker who presents for evaluation of shortness of breath for approximately a month and a half.  She has a past history of rheumatic fever at age 64.  She has recently been diagnosed with atrial fibrillation and is currently on Eliquis. She has noted palpitations particularly when she lays down to bed at night.  She has not had any orthopnea or paroxysmal nocturnal dyspnea per se.  Recent echocardiogram has not shown any significant abnormality and no mitral regurgitation.  As noted the atrial fibrillation is a newfound rhythm.  She has been on beta-blockers since around 2010.  She describes her dyspnea mostly as fatigue.  She also states that she has not been as active due to this fatigue she notes her shortness of breath is worse when she is having palpitations.  She notes that when she deep breathes she has a cough that is mostly nonproductive.  Sometimes the cough can occur after meals.  No hemoptysis.  She has not had any chest pain.  No lower extremity edema.  She does endorse that her sensation of dyspnea/fatigue coincides with her worsening of palpitations.  She notes that this is made better by sitting to rest and her symptoms are worsened with activity.  She is on no inhalers.  She has significant issues with gastroesophageal reflux symptoms.  This has been of longstanding.  She states that previously [place] worked better for the symptom however her insurance has quit paying for it and her current medication which is Prilosec does not really help.   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  She has no pets in the home.  No hot tubs.  No unusual hobbies she engages in making quilts no military history   Review of Systems A 10 point review of systems was performed and it is as noted above otherwise negative.  Allergies   Allergen Reactions  . Aspirin Other (See Comments)    GI bleed  . Baclofen Diarrhea  . Codeine Nausea And Vomiting  . Imitrex  [Sumatriptan]     Palpatations  . Influenza Vaccines Swelling  . Vicodin  [Hydrocodone-Acetaminophen] Nausea And Vomiting   Current Meds  Medication Sig  . atorvastatin (LIPITOR) 40 MG tablet TAKE 1 TABLET DAILY  . B COMPLEX VITAMINS PO Take by mouth daily.  . Calcium Citrate (CITRACAL PO) Take 1 tablet by mouth at bedtime.  . chlorhexidine (PERIDEX) 0.12 % solution as needed.   . cycloSPORINE (RESTASIS) 0.05 % ophthalmic emulsion Apply 0.05 drops to eye 2 (two) times daily.  Marland Kitchen glucosamine-chondroitin 500-400 MG tablet Take 1 tablet by mouth 2 (two) times daily.  . Methylcellulose, Laxative, (FIBER THERAPY PO) Take 3 tablets by mouth daily.  . MULTIPLE VITAMIN PO Take by mouth daily.  . Probiotic Product (PROBIOTIC PO) Take 1 tablet by mouth daily.  . rizatriptan (MAXALT) 10 MG tablet Take 10 mg by mouth daily as needed.  . topiramate (TOPAMAX) 100 MG tablet Take 100 mg by mouth 2 (two) times daily.  . [DISCONTINUED] apixaban (ELIQUIS) 5 MG TABS tablet Take 1 tablet (5 mg total) by mouth 2 (two) times daily.  . [DISCONTINUED] diphenoxylate-atropine (LOMOTIL) 2.5-0.025 MG tablet Take 1 tablet by mouth 4 (four) times daily. (Patient taking differently: Take 1 tablet by mouth as needed. )  . [  DISCONTINUED] OMEGA-3 FATTY ACIDS PO Take 1,200 mg by mouth daily.   . [DISCONTINUED] omeprazole (PRILOSEC) 40 MG capsule Take 1 capsule (40 mg total) by mouth 2 (two) times a day. (Patient taking differently: Take 40 mg by mouth daily. )  . [DISCONTINUED] TOPROL XL 25 MG 24 hr tablet TAKE 1 TABLET DAILY   Past Medical History:  Diagnosis Date  . Basal cell carcinoma 05/22/2015   R nasal ala  . Basal cell carcinoma 07/21/2017   L upper arm, L neck, L medial shoulder  . Basal cell carcinoma 09/21/2018   R shoulder  . Diverticulosis   . DJD (degenerative joint disease)  of knee   . GERD (gastroesophageal reflux disease)   . H/o Iron deficiency anemia 2009   a. 05/2008 EGD/Colonoscopy: nl w/o evidence of bleeding; b. 11/2017 EGD: nl. Colonoscopy: 55mm cecal polyp, otw nl.  . H/O transfusion of whole blood 2009  . H/O: rheumatic fever    78 years old  . History of chicken pox   . History of echocardiogram    a. 09/2019 Echo: EF 60-65%, nl RV size/fxn. Nl Atrial sizes. Triv TR. Mild AI.  Marland Kitchen Hx of basal cell carcinoma 2010   L dorsum tip of nose (MOHS), R ant alar groove (MOHS)  . 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 Hyperlipidemia   . Migraines   . PAF (paroxysmal atrial fibrillation) ([place])    a. 08/2019 Zio: Avg HR 60 (min 43, max 144). PAF (2% burden), longest 1h 60m (avg rate 77). Rare PAC's and PVC's.  . Palpitation   . Pelvic prolapse    with rectocele  . Rosacea   . Seasonal allergic rhinitis    mild  . 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 Stress incontinence    Past Surgical History:  Procedure Laterality Date  . ABDOMINAL HYSTERECTOMY  1981  . APPENDECTOMY  1960  . capsule endoscopy  2009   WNL  . CATARACT EXTRACTION Bilateral [id number]  . COLONOSCOPY  2009   diverticulosis ([place])  . COLONOSCOPY WITH PROPOFOL N/A 12/01/2017   TA, rpt 5 yrs ([place])  . DEXA  06/2016   T -0.2 hip, 0.7 spine  . ESOPHAGOGASTRODUODENOSCOPY  2009   focal mild chronic gastritis, neg H pylori ([place])  . ESOPHAGOGASTRODUODENOSCOPY (EGD) WITH PROPOFOL N/A 12/01/2017   Procedure: ESOPHAGOGASTRODUODENOSCOPY (EGD) WITH PROPOFOL;  Surgeon: Lucilla Lame, MD;  [facility] END[facility];  Service: Endoscopy;  Laterality: N/A;  . INCONTINENCE SURGERY  2001   with mesh  . LIPOMA EXCISION  1971   right shoulder  . [place]  . OVARIAN CYST REMOVAL  1960  . SALPINGOOPHORECTOMY Left 2001  . TONSILLECTOMY  1958   Family History  Problem Relation Age of Onset  . Stroke Mother 33  . Cancer  Mother        Cervical  . Depression Mother   . Dementia Mother        vascular  . Heart disease Father        CABG  . Alcohol abuse Father   . Osteoporosis Father   . Heart attack Father   . Hypertension Brother   . Heart attack Brother   . Stroke Brother   . Stroke Brother 46  . Heart attack Brother   . Stroke Paternal Grandfather   . Alzheimer's disease Maternal Grandfather   . Breast cancer  Neg Hx    Social History   Tobacco Use  . Smoking status: Former Smoker    Packs/day: 0.50    Years: 20.00    Pack years: 10.00    Types: Cigarettes    Quit date: 12/15/1978    Years since quitting: 41.4  . Smokeless tobacco: Never Used  Substance Use Topics  . Alcohol use: No    Alcohol/week: 0.0 standard drinks    Comment: Rare        Objective:   Physical Exam BP 136/76 (BP Location: Left Arm, Cuff Size: Normal)   Pulse 62   Temp 97.9 F (36.6 C) (Temporal)   Ht 5\' 4"  (1.626 m)   Wt 159 lb 9.6 oz (72.4 kg)   SpO2 96%   BMI 27.40 kg/m   GENERAL: Awake, well-developed well-nourished woman in no acute distress.  Fully ambulatory. HEAD: Normocephalic, atraumatic.  EYES: Pupils equal, round, reactive to light.  No scleral icterus.  MOUTH: Nose/mouth/throat not examined due to masking requirements for COVID 19.  NECK: Supple. No thyromegaly. Trachea midline. No JVD.  No adenopathy. PULMONARY: Lungs clear to auscultation bilaterally. CARDIOVASCULAR: S1 and S2. Regular rate and rhythm.  No rubs, murmurs or gallops heard. GASTROINTESTINAL: Benign. MUSCULOSKELETAL: No joint deformity, no clubbing, no edema.  NEUROLOGIC: Awake, alert, fully oriented, no overt focal deficits.  Speech is fluent. SKIN: Intact,warm,dry. PSYCH: Mood and behavior appropriate.       Assessment & Plan:     ICD-10-CM   1. SOB (shortness of breath)  R06.02 Pulmonary function test   Will obtain pulmonary function testing May be related to cardiac issues, being evaluated  2. Cough  R05     Antireflux measures Continue management of GERD Trial of Spiriva to diminish cough reflex  3. Gastroesophageal reflux disease, unspecified whether esophagitis present  K21.9    Continue PPI Consider reevaluation by GI Suspect has significant LPR as well  4. Paroxysmal atrial fibrillation (HCC)  I48.0    This issue adds complexity to her management   Discussion:  The patient symptoms appear to be mostly related to her palpitations, she has a very remote history of smoking.  We will however evaluate this with PFTs as this will give [place] a better idea with regards to her dyspnea/fatigue.  She does seem to have significant amount of gastroesophageal reflux that is quite symptomatic and this may be adding to her issues.  We will see her in follow-up to her pulmonary function testing has been obtained.  We are giving her a trial of Spiriva to see if this helps with her cough issues.  Avoiding SABA/LABA's due to her issues with palpitations and paroxysmal atrial fibrillation.  Renold Don, MD [facility]   *This note was dictated using voice recognition software/Dragon.  Despite best efforts to proofread, errors can occur which can change the meaning.  Any change was purely unintentional.

## 2019-10-20 NOTE — Patient Instructions (Signed)
1.  We will order breathing tests.  2.  Giving you a trial of Spiriva Respimat 2 inhalations daily.  Let us know how this works for you.  3.  Follow-up in 3 to 4 weeks time.

## 2019-11-01 ENCOUNTER — Other Ambulatory Visit: Payer: Self-pay

## 2019-11-01 ENCOUNTER — Telehealth: Payer: Self-pay | Admitting: Pulmonary Disease

## 2019-11-01 NOTE — Telephone Encounter (Signed)
Pt is aware date/time of covid test.

## 2019-11-03 ENCOUNTER — Ambulatory Visit
Admission: RE | Admit: 2019-11-03 | Discharge: 2019-11-03 | Disposition: A | Discharge status: Home / Self Care | Payer: Medicare Other | Source: Ambulatory Visit | Attending: Nurse Practitioner | Admitting: Nurse Practitioner

## 2019-11-03 ENCOUNTER — Other Ambulatory Visit: Payer: Self-pay

## 2019-11-03 DIAGNOSIS — I4891 Unspecified atrial fibrillation: Secondary | ICD-10-CM | POA: Diagnosis not present

## 2019-11-03 MED ORDER — REGADENOSON 0.4 MG/5ML IV SOLN
0.4000 mg | Freq: Once | INTRAVENOUS | Status: AC
  Administered 2019-11-03: 0.4 mg via INTRAVENOUS

## 2019-11-03 MED ORDER — TECHNETIUM TC 99M TETROFOSMIN IV KIT
30.0000 | PACK | Freq: Once | INTRAVENOUS | Status: AC | PRN
  Administered 2019-11-03: 10:00:00 31.13 via INTRAVENOUS

## 2019-11-03 MED ORDER — TECHNETIUM TC 99M TETROFOSMIN IV KIT
10.6800 | PACK | Freq: Once | INTRAVENOUS | Status: AC | PRN
  Administered 2019-11-03: 10.68 via INTRAVENOUS

## 2019-11-04 ENCOUNTER — Other Ambulatory Visit
Admission: RE | Admit: 2019-11-04 | Discharge: 2019-11-04 | Disposition: A | Discharge status: Home / Self Care | Payer: Medicare Other | Source: Ambulatory Visit | Attending: Pulmonary Disease | Admitting: Pulmonary Disease

## 2019-11-04 DIAGNOSIS — Z01812 Encounter for preprocedural laboratory examination: Secondary | ICD-10-CM | POA: Insufficient documentation

## 2019-11-04 DIAGNOSIS — Z20828 Contact with and (suspected) exposure to other viral communicable diseases: Secondary | ICD-10-CM | POA: Insufficient documentation

## 2019-11-04 LAB — SARS CORONAVIRUS 2 (TAT 6-24 HRS): SARS Coronavirus 2: NEGATIVE

## 2019-11-05 LAB — NM MYOCAR MULTI W/SPECT W/WALL MOTION / EF
Estimated workload: 1 METS
Exercise duration (min): 0 min
Exercise duration (sec): 0 s
LV dias vol: 64 mL (ref 46–106)
LV sys vol: 18 mL
MPHR: 142 {beats}/min
Peak HR: 108 {beats}/min
Percent HR: 76 %
Rest HR: 57 {beats}/min
SDS: 0
SRS: 9
SSS: 3
TID: 1.09

## 2019-11-07 ENCOUNTER — Ambulatory Visit (HOSPITAL_COMMUNITY)
Admission: RE | Admit: 2019-11-07 | Discharge: 2019-11-07 | Disposition: A | Discharge status: Home / Self Care | Payer: Medicare Other | Source: Ambulatory Visit | Attending: Pulmonary Disease | Admitting: Pulmonary Disease

## 2019-11-07 ENCOUNTER — Other Ambulatory Visit: Payer: Self-pay

## 2019-11-07 DIAGNOSIS — R0602 Shortness of breath: Secondary | ICD-10-CM | POA: Diagnosis not present

## 2019-11-07 LAB — PULMONARY FUNCTION TEST
DL/VA % pred: 85 %
DL/VA: 3.51 ml/min/mmHg/L
DLCO unc % pred: 80 %
DLCO unc: 15.15 ml/min/mmHg
FEF 25-75 Post: 2.72 L/sec
FEF 25-75 Pre: 2.69 L/sec
FEF2575-%Change-Post: 0 %
FEF2575-%Pred-Post: 179 %
FEF2575-%Pred-Pre: 178 %
FEV1-%Change-Post: 0 %
FEV1-%Pred-Post: 115 %
FEV1-%Pred-Pre: 116 %
FEV1-Post: 2.31 L
FEV1-Pre: 2.34 L
FEV1FVC-%Change-Post: 3 %
FEV1FVC-%Pred-Pre: 109 %
FEV6-%Change-Post: -2 %
FEV6-%Pred-Post: 106 %
FEV6-%Pred-Pre: 110 %
FEV6-Post: 2.73 L
FEV6-Pre: 2.81 L
FEV6FVC-%Change-Post: 1 %
FEV6FVC-%Pred-Post: 104 %
FEV6FVC-%Pred-Pre: 103 %
FVC-%Change-Post: -4 %
FVC-%Pred-Post: 101 %
FVC-%Pred-Pre: 106 %
FVC-Post: 2.75 L
FVC-Pre: 2.86 L
Post FEV1/FVC ratio: 84 %
Post FEV6/FVC ratio: 99 %
Pre FEV1/FVC ratio: 82 %
Pre FEV6/FVC Ratio: 98 %
RV % pred: 98 %
RV: 2.32 L
TLC % pred: 104 %
TLC: 5.29 L

## 2019-11-07 MED ORDER — ALBUTEROL SULFATE (2.5 MG/3ML) 0.083% IN NEBU
2.5000 mg | INHALATION_SOLUTION | Freq: Once | RESPIRATORY_TRACT | Status: AC
  Administered 2019-11-07: 2.5 mg via RESPIRATORY_TRACT

## 2019-11-11 ENCOUNTER — Emergency Department
Admission: EM | Admit: 2019-11-11 | Discharge: 2019-11-11 | Disposition: A | Discharge status: Home / Self Care | Payer: Medicare Other | Attending: Student in an Organized Health Care Education/Training Program | Admitting: Student in an Organized Health Care Education/Training Program

## 2019-11-11 ENCOUNTER — Emergency Department: Payer: Medicare Other

## 2019-11-11 ENCOUNTER — Other Ambulatory Visit: Payer: Self-pay

## 2019-11-11 DIAGNOSIS — R109 Unspecified abdominal pain: Secondary | ICD-10-CM

## 2019-11-11 DIAGNOSIS — R1032 Left lower quadrant pain: Secondary | ICD-10-CM | POA: Diagnosis present

## 2019-11-11 DIAGNOSIS — Z87891 Personal history of nicotine dependence: Secondary | ICD-10-CM | POA: Insufficient documentation

## 2019-11-11 DIAGNOSIS — N201 Calculus of ureter: Secondary | ICD-10-CM | POA: Diagnosis not present

## 2019-11-11 DIAGNOSIS — Z7901 Long term (current) use of anticoagulants: Secondary | ICD-10-CM | POA: Insufficient documentation

## 2019-11-11 DIAGNOSIS — Z79899 Other long term (current) drug therapy: Secondary | ICD-10-CM | POA: Diagnosis not present

## 2019-11-11 DIAGNOSIS — N200 Calculus of kidney: Secondary | ICD-10-CM | POA: Diagnosis not present

## 2019-11-11 LAB — URINALYSIS, COMPLETE (UACMP) WITH MICROSCOPIC
Bilirubin Urine: NEGATIVE
Glucose, UA: NEGATIVE mg/dL
Ketones, ur: NEGATIVE mg/dL
Leukocytes,Ua: NEGATIVE
Nitrite: NEGATIVE
Protein, ur: NEGATIVE mg/dL
RBC / HPF: >50 RBC/hpf — ABNORMAL HIGH (ref 0–5)
Specific Gravity, Urine: 1.006 (ref 1.005–1.030)
pH: 7 (ref 5.0–8.0)

## 2019-11-11 LAB — CBC WITH DIFFERENTIAL/PLATELET
Abs Immature Granulocytes: 0.02 10*3/uL (ref 0.00–0.07)
Basophils Absolute: 0 10*3/uL (ref 0.0–0.1)
Basophils Relative: 1 %
Eosinophils Absolute: 0.1 10*3/uL (ref 0.0–0.5)
Eosinophils Relative: 2 %
HCT: 42.8 % (ref 36.0–46.0)
Hemoglobin: 14.6 g/dL (ref 12.0–15.0)
Immature Granulocytes: 0 %
Lymphocytes Relative: 23 %
Lymphs Abs: 1.3 10*3/uL (ref 0.7–4.0)
MCH: 30.8 pg (ref 26.0–34.0)
MCHC: 34.1 g/dL (ref 30.0–36.0)
MCV: 90.3 fL (ref 80.0–100.0)
Monocytes Absolute: 0.2 10*3/uL (ref 0.1–1.0)
Monocytes Relative: 4 %
Neutro Abs: 4 10*3/uL (ref 1.7–7.7)
Neutrophils Relative %: 70 %
Platelets: 149 10*3/uL — ABNORMAL LOW (ref 150–400)
RBC: 4.74 MIL/uL (ref 3.87–5.11)
RDW: 12.6 % (ref 11.5–15.5)
WBC: 5.7 10*3/uL (ref 4.0–10.5)
nRBC: 0 % (ref 0.0–0.2)

## 2019-11-11 LAB — PROTIME-INR
INR: 1.1 (ref 0.8–1.2)
Prothrombin Time: 13.7 seconds (ref 11.4–15.2)

## 2019-11-11 LAB — COMPREHENSIVE METABOLIC PANEL
ALT: 25 U/L (ref 0–44)
AST: 29 U/L (ref 15–41)
Albumin: 3.8 g/dL (ref 3.5–5.0)
Alkaline Phosphatase: 80 U/L (ref 38–126)
Anion gap: 8 (ref 5–15)
BUN: 16 mg/dL (ref 8–23)
CO2: 22 mmol/L (ref 22–32)
Calcium: 8.9 mg/dL (ref 8.9–10.3)
Chloride: 111 mmol/L (ref 98–111)
Creatinine, Ser: 0.79 mg/dL (ref 0.44–1.00)
GFR calc Af Amer: >60 mL/min (ref >60)
GFR calc non Af Amer: >60 mL/min (ref >60)
Glucose, Bld: 94 mg/dL (ref 70–99)
Potassium: 4.2 mmol/L (ref 3.5–5.1)
Sodium: 141 mmol/L (ref 135–145)
Total Bilirubin: 0.6 mg/dL (ref 0.3–1.2)
Total Protein: 6.5 g/dL (ref 6.5–8.1)

## 2019-11-11 LAB — APTT: aPTT: 35 seconds (ref 24–36)

## 2019-11-11 MED ORDER — SODIUM CHLORIDE 0.9 % IV BOLUS
500.0000 mL | Freq: Once | INTRAVENOUS | Status: AC
  Administered 2019-11-11: 500 mL via INTRAVENOUS

## 2019-11-11 MED ORDER — TAMSULOSIN HCL 0.4 MG PO CAPS: 0.4000 mg | ORAL_CAPSULE | Freq: Every day | ORAL | 0 refills | Status: DC

## 2019-11-11 MED ORDER — ONDANSETRON 4 MG PO TBDP: 4.0000 mg | ORAL_TABLET | Freq: Once | ORAL | Status: DC

## 2019-11-11 MED ORDER — IOHEXOL 300 MG/ML  SOLN
100.0000 mL | Freq: Once | INTRAMUSCULAR | Status: AC | PRN
  Administered 2019-11-11: 100 mL via INTRAVENOUS

## 2019-11-11 MED ORDER — HYDROCODONE-ACETAMINOPHEN 5-325 MG PO TABS: 1.0000 | ORAL_TABLET | ORAL | 0 refills | Status: DC | PRN

## 2019-11-11 MED ORDER — ONDANSETRON HCL 4 MG PO TABS: 4.0000 mg | ORAL_TABLET | Freq: Every day | ORAL | 0 refills | Status: DC | PRN

## 2019-11-11 MED ORDER — FENTANYL CITRATE (PF) 100 MCG/2ML IJ SOLN
50.0000 ug | INTRAMUSCULAR | Status: DC | PRN
  Filled 2019-11-11: qty 2

## 2019-11-11 MED ORDER — ONDANSETRON HCL 4 MG/2ML IJ SOLN
4.0000 mg | Freq: Once | INTRAMUSCULAR | Status: AC
  Administered 2019-11-11: 4 mg via INTRAVENOUS
  Filled 2019-11-11: qty 2

## 2019-11-11 MED ORDER — KETOROLAC TROMETHAMINE 30 MG/ML IJ SOLN
15.0000 mg | Freq: Once | INTRAMUSCULAR | Status: AC
  Administered 2019-11-11: 15 mg via INTRAVENOUS
  Filled 2019-11-11: qty 1

## 2019-11-11 NOTE — ED Triage Notes (Addendum)
Pt c/o pain in the left mid quad that is now having pain in the LLQ since Monday, Tuesday had blood in her urine and was ok Wednesday and started having pain again yesterday and today. Pt also c/o having bloody diarrhea that started this morning.

## 2019-11-11 NOTE — ED Provider Notes (Signed)
[facility] Emergency Department Provider Note    First MD Initiated Contact with Patient 11/11/19 [phone]     (approximate)  I have reviewed the triage vital signs and the nursing notes.   HISTORY  Chief Complaint Abdominal Pain    HPI Alexandria Moreno is a 78 y.o. female the below listed past medical history currently on Eliquis for history of A. fib presents to the ER for 3 days progressively worsening intermittent waxing and waning left lower quadrant pain.  States that 2 days ago she did notice some hematuria and wondered if this could be related to kidney stone.  States the pain subsided so she went about her business.  Then through the night had difficulty sleeping due to worsening pain.  Woke up this morning did have a bloody bowel movement was diarrhea primarily.  Denies measured fevers.  Does have a history of diverticulosis.  States the pain is mild at this point.  No nausea or vomiting.    Past Medical History:  Diagnosis Date  . Diverticulosis   . DJD (degenerative joint disease) of knee   . GERD (gastroesophageal reflux disease)   . H/o Iron deficiency anemia 2009   a. 05/2008 EGD/Colonoscopy: nl w/o evidence of bleeding; b. 11/2017 EGD: nl. Colonoscopy: 85mm cecal polyp, otw nl.  . H/O transfusion of whole blood 2009  . H/O: rheumatic fever    78 years old  . History of chicken pox   . History of echocardiogram    a. 09/2019 Echo: EF 60-65%, nl RV size/fxn. Nl Atrial sizes. Triv TR. Mild AI.  Marland Kitchen Hx of basal cell carcinoma    multiple sites  . 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 Hyperlipidemia   . Migraines   . PAF (paroxysmal atrial fibrillation) ([place])    a. 08/2019 Zio: Avg HR 60 (min 43, max 144). PAF (2% burden), longest 1h 42m (avg rate 77). Rare PAC's and PVC's.  . Palpitation   . Pelvic prolapse    with rectocele  . Rosacea   . Seasonal allergic rhinitis    mild  . Stress incontinence    Family  History  Problem Relation Age of Onset  . Stroke Mother 19  . Cancer Mother        Cervical  . Depression Mother   . Dementia Mother        vascular  . Heart disease Father        CABG  . Alcohol abuse Father   . Osteoporosis Father   . Heart attack Father   . Hypertension Brother   . Heart attack Brother   . Stroke Brother   . Stroke Brother 25  . Heart attack Brother   . Stroke Paternal Grandfather   . Alzheimer's disease Maternal Grandfather   . Breast cancer Neg Hx    Past Surgical History:  Procedure Laterality Date  . ABDOMINAL HYSTERECTOMY  1981  . APPENDECTOMY  1960  . capsule endoscopy  2009   WNL  . CATARACT EXTRACTION Bilateral [id number]  . COLONOSCOPY  2009   diverticulosis ([place])  . COLONOSCOPY WITH PROPOFOL N/A 12/01/2017   TA, rpt 5 yrs ([place])  . DEXA  06/2016   T -0.2 hip, 0.7 spine  . ESOPHAGOGASTRODUODENOSCOPY  2009   focal mild chronic gastritis, neg H pylori ([place])  . ESOPHAGOGASTRODUODENOSCOPY (EGD) WITH PROPOFOL N/A 12/01/2017   Procedure: ESOPHAGOGASTRODUODENOSCOPY (EGD) WITH PROPOFOL;  Surgeon: Lucilla Lame,  MD;  Location: [facility] ENDOSCOPY;  Service: Endoscopy;  Laterality: N/A;  . INCONTINENCE SURGERY  2001   with mesh  . LIPOMA EXCISION  1971   right shoulder  . [place]  . OVARIAN CYST REMOVAL  1960  . SALPINGOOPHORECTOMY Left 2001  . TONSILLECTOMY  1958   Patient Active Problem List   Diagnosis Date Noted  . Medicare annual wellness visit, subsequent 07/25/2019  . Abnormal EKG 07/25/2019  . Leg cramping 07/15/2019  . Esophageal dysphagia   . Benign neoplasm of cecum   . Polycythemia 10/15/2017  . Hoarseness 07/13/2017  . 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 Advanced care planning/counseling discussion 06/30/2016  . Chronic left SI joint pain 01/08/2016  . Palpitation 11/19/2015  . Post-nasal drainage 11/19/2015  . Chronic migraine 05/18/2015  . Allergic  rhinitis 03/19/2015  . 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 Dyslipidemia 03/19/2015  . GERD (gastroesophageal reflux disease) 03/19/2015  . H/O: rheumatic fever 03/19/2015  . H/O adenomatous polyp of colon 03/19/2015  . 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 Acne erythematosa 03/19/2015  . 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 Stress incontinence, female 03/19/2015      Prior to Admission medications   Medication Sig Start Date End Date Taking? Authorizing Provider  apixaban (ELIQUIS) 5 MG TABS tablet Take 1 tablet (5 mg total) by mouth 2 (two) times daily. 09/27/19   Theora Gianotti, NP  atorvastatin (LIPITOR) 40 MG tablet TAKE 1 TABLET DAILY 09/14/19   Ria Bush, MD  B COMPLEX VITAMINS PO Take by mouth daily.    [provider]  Calcium Citrate (CITRACAL PO) Take 1 tablet by mouth at bedtime.    [provider]  chlorhexidine (PERIDEX) 0.12 % solution as needed.  11/10/17   [provider]  cycloSPORINE (RESTASIS) 0.05 % ophthalmic emulsion Apply 0.05 drops to eye 2 (two) times daily.    [provider]  diphenoxylate-atropine (LOMOTIL) 2.5-0.025 MG tablet Take 1 tablet by mouth 4 (four) times daily. Patient taking differently: Take 1 tablet by mouth as needed.  04/19/18   Lucilla Lame, MD  glucosamine-chondroitin 500-400 MG tablet Take 1 tablet by mouth 2 (two) times daily.    [provider]  HYDROcodone-acetaminophen (NORCO) 5-325 MG tablet Take 1 tablet by mouth every 4 (four) hours as needed for moderate pain. 11/11/19   Merlyn Lot, MD  Methylcellulose, Laxative, (FIBER THERAPY PO) Take 3 tablets by mouth daily.    [provider]  MULTIPLE VITAMIN PO Take by mouth daily.    Roselee Nova, MD  OMEGA-3 FATTY ACIDS PO Take 1,200 mg by mouth daily.  02/27/14   Roselee Nova, MD  omeprazole (PRILOSEC) 40 MG capsule Take 1 capsule (40 mg total) by mouth 2 (two) times a day.  Patient taking differently: Take 40 mg by mouth daily.  06/14/19   Lucilla Lame, MD  ondansetron (ZOFRAN) 4 MG tablet Take 1 tablet (4 mg total) by mouth daily as needed. 11/11/19 11/10/20  Merlyn Lot, MD  Probiotic Product (PROBIOTIC PO) Take 1 tablet by mouth daily.    [provider]  rizatriptan (MAXALT) 10 MG tablet Take 10 mg by mouth daily as needed.    Roselee Nova, MD  tamsulosin (FLOMAX) 0.4 MG CAPS capsule Take 1 capsule (0.4 mg total) by mouth daily after supper. 11/11/19   Merlyn Lot, MD  Tiotropium Bromide  Monohydrate (SPIRIVA RESPIMAT) 2.5 MCG/ACT AERS Inhale 2 puffs into the lungs daily for 1 day. 10/20/19 10/21/19  Tyler Pita, MD  topiramate (TOPAMAX) 100 MG tablet Take 100 mg by mouth 2 (two) times daily.    [provider]  TOPROL XL 25 MG 24 hr tablet TAKE 1 TABLET DAILY 09/14/19   Ria Bush, MD    Allergies Aspirin, Baclofen, Codeine, Imitrex  [sumatriptan], Influenza vaccines, and Vicodin  [hydrocodone-acetaminophen]    Social History Social History   Tobacco Use  . Smoking status: Former Smoker    Packs/day: 0.50    Years: 20.00    Pack years: 10.00    Types: Cigarettes    Quit date: 12/15/1978    Years since quitting: 40.9  . Smokeless tobacco: Never Used  Substance Use Topics  . Alcohol use: No    Alcohol/week: 0.0 standard drinks    Comment: Rare  . Drug use: No    Review of Systems Patient denies headaches, rhinorrhea, blurry vision, numbness, shortness of breath, chest pain, edema, cough, abdominal pain, nausea, vomiting, diarrhea, dysuria, fevers, rashes or hallucinations unless otherwise stated above in HPI. ____________________________________________   PHYSICAL EXAM:  VITAL SIGNS: Vitals:   11/11/19 1130 11/11/19 1148  BP: (!) 135/56 (!) 172/64  Pulse: 66   Resp: 18   Temp:    SpO2: 100%     Constitutional: Alert and oriented.  Eyes: Conjunctivae are normal.  Head: Atraumatic. Nose:  No congestion/rhinnorhea. Mouth/Throat: Mucous membranes are moist.   Neck: No stridor. Painless ROM.  Cardiovascular: Normal rate, regular rhythm. Grossly normal heart sounds.  Good peripheral circulation. Respiratory: Normal respiratory effort.  No retractions. Lungs CTAB. Gastrointestinal: Soft with mild ttp in LLQ. No distention. No abdominal bruits. No CVA tenderness. Genitourinary: non bleeding external hemorrhoids.  No blood or melena on DRE Musculoskeletal: No lower extremity tenderness nor edema.  No joint effusions. Neurologic:  Normal speech and language. No gross focal neurologic deficits are appreciated. No facial droop Skin:  Skin is warm, dry and intact. No rash noted. Psychiatric: Mood and affect are normal. Speech and behavior are normal.  ____________________________________________   LABS (all labs ordered are listed, but only abnormal results are displayed)  Results for orders placed or performed during the hospital encounter of 11/11/19 (from the past 24 hour(s))  CBC with Differential     Status: Abnormal   Collection Time: 11/11/19 10:26 AM  Result Value Ref Range   WBC 5.7 4.0 - 10.5 K/uL   RBC 4.74 3.87 - 5.11 MIL/uL   Hemoglobin 14.6 12.0 - 15.0 g/dL   HCT 42.8 36.0 - 46.0 %   MCV 90.3 80.0 - 100.0 fL   MCH 30.8 26.0 - 34.0 pg   MCHC 34.1 30.0 - 36.0 g/dL   RDW 12.6 11.5 - 15.5 %   Platelets 149 (L) 150 - 400 K/uL   nRBC 0.0 0.0 - 0.2 %   Neutrophils Relative % 70 %   Neutro Abs 4.0 1.7 - 7.7 K/uL   Lymphocytes Relative 23 %   Lymphs Abs 1.3 0.7 - 4.0 K/uL   Monocytes Relative 4 %   Monocytes Absolute 0.2 0.1 - 1.0 K/uL   Eosinophils Relative 2 %   Eosinophils Absolute 0.1 0.0 - 0.5 K/uL   Basophils Relative 1 %   Basophils Absolute 0.0 0.0 - 0.1 K/uL   Immature Granulocytes 0 %   Abs Immature Granulocytes 0.02 0.00 - 0.07 K/uL  Comprehensive metabolic panel  Status: None   Collection Time: 11/11/19 10:26 AM  Result Value Ref Range   Sodium  141 135 - 145 mmol/L   Potassium 4.2 3.5 - 5.1 mmol/L   Chloride 111 98 - 111 mmol/L   CO2 22 22 - 32 mmol/L   Glucose, Bld 94 70 - 99 mg/dL   BUN 16 8 - 23 mg/dL   Creatinine, Ser 0.79 0.44 - 1.00 mg/dL   Calcium 8.9 8.9 - 10.3 mg/dL   Total Protein 6.5 6.5 - 8.1 g/dL   Albumin 3.8 3.5 - 5.0 g/dL   AST 29 15 - 41 U/L   ALT 25 0 - 44 U/L   Alkaline Phosphatase 80 38 - 126 U/L   Total Bilirubin 0.6 0.3 - 1.2 mg/dL   GFR calc non Af Amer >60 >60 mL/min   GFR calc Af Amer >60 >60 mL/min   Anion gap 8 5 - 15  Protime-INR     Status: None   Collection Time: 11/11/19 10:26 AM  Result Value Ref Range   Prothrombin Time 13.7 11.4 - 15.2 seconds   INR 1.1 0.8 - 1.2  APTT     Status: None   Collection Time: 11/11/19 10:26 AM  Result Value Ref Range   aPTT 35 24 - 36 seconds  Urinalysis, Complete w Microscopic     Status: Abnormal   Collection Time: 11/11/19 10:27 AM  Result Value Ref Range   Color, Urine YELLOW (A) YELLOW   APPearance CLEAR (A) CLEAR   Specific Gravity, Urine 1.006 1.005 - 1.030   pH 7.0 5.0 - 8.0   Glucose, UA NEGATIVE NEGATIVE mg/dL   Hgb urine dipstick LARGE (A) NEGATIVE   Bilirubin Urine NEGATIVE NEGATIVE   Ketones, ur NEGATIVE NEGATIVE mg/dL   Protein, ur NEGATIVE NEGATIVE mg/dL   Nitrite NEGATIVE NEGATIVE   Leukocytes,Ua NEGATIVE NEGATIVE   RBC / HPF >50 (H) 0 - 5 RBC/hpf   WBC, UA 0-5 0 - 5 WBC/hpf   Bacteria, UA RARE (A) NONE SEEN   Squamous Epithelial / LPF 0-5 0 - 5   Mucus PRESENT    ____________________________________________ ____________________________________________  RADIOLOGY  I personally reviewed all radiographic images ordered to evaluate for the above acute complaints and reviewed radiology reports and findings.  These findings were personally discussed with the patient.  Please see medical record for radiology report.  ____________________________________________   PROCEDURES  Procedure(s) performed:  Procedures    Critical  Care performed: no ____________________________________________   INITIAL IMPRESSION / ASSESSMENT AND PLAN / ED COURSE  Pertinent labs & imaging results that were available during my care of the patient were reviewed by me and considered in my medical decision making (see chart for details).   DDX: Colitis, diverticulitis, abscess, perforation, stone, UTI, [place], mass  Alexandria Moreno is a 79 y.o. who presents to the ED with left-sided abdominal pain as described above with evidence of 3 mm distal left ureteral stone which explains the patient's pain.  Not consistent with acute GI bleed given reassuring exam.  May have had a hemorrhoid that was bleeding but is currently not.  Blood work otherwise reassuring.  Urinalysis without any signs of infection.  Pain controlled in the ER.  Stable appropriate for outpatient follow-up.     The patient was evaluated in Emergency Department today for the symptoms described in the history of present illness. He/she was evaluated in the context of the global COVID-19 pandemic, which necessitated consideration that the patient might be at  risk for infection with the SARS-CoV-2 virus that causes COVID-19. Institutional protocols and algorithms that pertain to the evaluation of patients at risk for COVID-19 are in a state of rapid change based on information released by regulatory bodies including the CDC and federal and state organizations. These policies and algorithms were followed during the patient's care in the ED.  As part of my medical decision making, I reviewed the following data within the [place] notes reviewed and incorporated, Labs reviewed, notes from prior ED visits and [place] Controlled Substance Database   ____________________________________________   FINAL CLINICAL IMPRESSION(S) / ED DIAGNOSES  Final diagnoses:  Acute left flank pain  Kidney stone      NEW MEDICATIONS STARTED DURING THIS VISIT:  New  Prescriptions   HYDROCODONE-ACETAMINOPHEN (NORCO) 5-325 MG TABLET    Take 1 tablet by mouth every 4 (four) hours as needed for moderate pain.   ONDANSETRON (ZOFRAN) 4 MG TABLET    Take 1 tablet (4 mg total) by mouth daily as needed.   TAMSULOSIN (FLOMAX) 0.4 MG CAPS CAPSULE    Take 1 capsule (0.4 mg total) by mouth daily after supper.     Note:  This document was prepared using Dragon voice recognition software and may include unintentional dictation errors.    Merlyn Lot, MD 11/11/19 1258

## 2019-11-11 NOTE — Discharge Instructions (Signed)

## 2019-11-14 ENCOUNTER — Other Ambulatory Visit: Payer: Self-pay

## 2019-11-14 ENCOUNTER — Ambulatory Visit (INDEPENDENT_AMBULATORY_CARE_PROVIDER_SITE_OTHER): Payer: Medicare Other | Admitting: Cardiology

## 2019-11-14 ENCOUNTER — Encounter: Payer: Self-pay | Admitting: Cardiology

## 2019-11-14 VITALS — BP 140/70 | HR 62 | Temp 96.7°F | Ht 64.0 in | Wt 159.2 lb

## 2019-11-14 DIAGNOSIS — I351 Nonrheumatic aortic (valve) insufficiency: Secondary | ICD-10-CM | POA: Diagnosis not present

## 2019-11-14 DIAGNOSIS — I480 Paroxysmal atrial fibrillation: Secondary | ICD-10-CM | POA: Diagnosis not present

## 2019-11-14 DIAGNOSIS — E7800 Pure hypercholesterolemia, unspecified: Secondary | ICD-10-CM | POA: Diagnosis not present

## 2019-11-14 NOTE — Progress Notes (Signed)
Cardiology Office Note:    Date:  11/14/2019   ID:  Alexandria Moreno, DOB 1941/01/31, MRN [id number]  PCP:  Ria Bush, MD  Cardiologist:  Kate Sable, MD  Electrophysiologist:  None   Referring MD: Ria Bush, MD   Chief Complaint  Patient presents with  . office visit    6 week F/U after Myoview and wearing ZIO monitor    History of Present Illness:    Alexandria Moreno is a 78 y.o. female with a hx of palpitations rheumatic fever at age 60 years who presents for follow-up.  She was originally seen due to palpitations over the last couple of weeks.    Patient has had symptoms of palpitations for years now and originally was placed on Toprol.  She has been taking Toprol which has helped with the symptoms, but over the past month or so the symptoms have increased in frequency.  She states symptoms of palpitations occur randomly sometimes at rest and sometimes with exertion  A 2-week Holter monitor did show paroxysmal atrial fibrillation.  She followed up with A. fib clinic and antiarrhythmics were being planned.  Lexiscan Myoview was ordered which did not show any significant ischemia.  Patient states she feels lousy/dizzy after atrial fibrillation episodes.  She knows she is in A. fib because her heart rate is irregular.   Past Medical History:  Diagnosis Date  . Diverticulosis   . DJD (degenerative joint disease) of knee   . GERD (gastroesophageal reflux disease)   . H/o Iron deficiency anemia 2009   a. 05/2008 EGD/Colonoscopy: nl w/o evidence of bleeding; b. 11/2017 EGD: nl. Colonoscopy: 41mm cecal polyp, otw nl.  . H/O transfusion of whole blood 2009  . H/O: rheumatic fever    78 years old  . History of chicken pox   . History of echocardiogram    a. 09/2019 Echo: EF 60-65%, nl RV size/fxn. Nl Atrial sizes. Triv TR. Mild AI.  Marland Kitchen Hx of basal cell carcinoma    multiple sites  . 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  Hyperlipidemia   . Migraines   . PAF (paroxysmal atrial fibrillation) ([place])    a. 08/2019 Zio: Avg HR 60 (min 43, max 144). PAF (2% burden), longest 1h 44m (avg rate 77). Rare PAC's and PVC's.  . Palpitation   . Pelvic prolapse    with rectocele  . Rosacea   . Seasonal allergic rhinitis    mild  . Stress incontinence     Past Surgical History:  Procedure Laterality Date  . ABDOMINAL HYSTERECTOMY  1981  . APPENDECTOMY  1960  . capsule endoscopy  2009   WNL  . CATARACT EXTRACTION Bilateral [id number]  . COLONOSCOPY  2009   diverticulosis ([place])  . COLONOSCOPY WITH PROPOFOL N/A 12/01/2017   TA, rpt 5 yrs ([place])  . DEXA  06/2016   T -0.2 hip, 0.7 spine  . ESOPHAGOGASTRODUODENOSCOPY  2009   focal mild chronic gastritis, neg H pylori ([place])  . ESOPHAGOGASTRODUODENOSCOPY (EGD) WITH PROPOFOL N/A 12/01/2017   Procedure: ESOPHAGOGASTRODUODENOSCOPY (EGD) WITH PROPOFOL;  Surgeon: Lucilla Lame, MD;  Location: [facility] ENDOSCOPY;  Service: Endoscopy;  Laterality: N/A;  . INCONTINENCE SURGERY  2001   with mesh  . LIPOMA EXCISION  1971   right shoulder  . [place]  . OVARIAN CYST REMOVAL  1960  . SALPINGOOPHORECTOMY Left 2001  . TONSILLECTOMY  1958    Current Medications:  Current Meds  Medication Sig  . apixaban (ELIQUIS) 5 MG TABS tablet Take 1 tablet (5 mg total) by mouth 2 (two) times daily.  Marland Kitchen atorvastatin (LIPITOR) 40 MG tablet TAKE 1 TABLET DAILY  . B COMPLEX VITAMINS PO Take by mouth daily.  . Calcium Citrate (CITRACAL PO) Take 1 tablet by mouth at bedtime.  . chlorhexidine (PERIDEX) 0.12 % solution as needed.   . cycloSPORINE (RESTASIS) 0.05 % ophthalmic emulsion Apply 0.05 drops to eye 2 (two) times daily.  . diphenoxylate-atropine (LOMOTIL) 2.5-0.025 MG tablet Take 1 tablet by mouth 4 (four) times daily. (Patient taking differently: Take 1 tablet by mouth as needed. )  . glucosamine-chondroitin 500-400 MG tablet Take 1 tablet by mouth 2 (two) times daily.  Marland Kitchen  HYDROcodone-acetaminophen (NORCO) 5-325 MG tablet Take 1 tablet by mouth every 4 (four) hours as needed for moderate pain.  . Methylcellulose, Laxative, (FIBER THERAPY PO) Take 3 tablets by mouth daily.  . MULTIPLE VITAMIN PO Take by mouth daily.  . OMEGA-3 FATTY ACIDS PO Take 1,200 mg by mouth daily.   Marland Kitchen omeprazole (PRILOSEC) 40 MG capsule Take 1 capsule (40 mg total) by mouth 2 (two) times a day. (Patient taking differently: Take 40 mg by mouth daily. )  . ondansetron (ZOFRAN) 4 MG tablet Take 1 tablet (4 mg total) by mouth daily as needed.  . Probiotic Product (PROBIOTIC PO) Take 1 tablet by mouth daily.  . rizatriptan (MAXALT) 10 MG tablet Take 10 mg by mouth daily as needed.  . Tiotropium Bromide Monohydrate (SPIRIVA RESPIMAT) 2.5 MCG/ACT AERS Inhale 2 puffs into the lungs daily for 1 day.  . topiramate (TOPAMAX) 100 MG tablet Take 100 mg by mouth 2 (two) times daily.  . TOPROL XL 25 MG 24 hr tablet TAKE 1 TABLET DAILY     Allergies:   Aspirin, Baclofen, Codeine, Imitrex  [sumatriptan], Influenza vaccines, and Vicodin  [hydrocodone-acetaminophen]   Social History   Socioeconomic History  . Marital status: Married    Spouse name: Not on file  . Number of children: Not on file  . Years of education: Not on file  . Highest education level: Not on file  Occupational History  . Not on file  Social Needs  . Financial resource strain: Not on file  . Food insecurity    Worry: Not on file    Inability: Not on file  . Transportation needs    Medical: Not on file    Non-medical: Not on file  Tobacco Use  . Smoking status: Former Smoker    Packs/day: 0.50    Years: 20.00    Pack years: 10.00    Types: Cigarettes    Quit date: 12/15/1978    Years since quitting: 40.9  . Smokeless tobacco: Never Used  Substance and Sexual Activity  . Alcohol use: No    Alcohol/week: 0.0 standard drinks    Comment: Rare  . Drug use: No  . Sexual activity: Never  Lifestyle  . Physical activity     Days per week: Not on file    Minutes per session: Not on file  . Stress: Not on file  Relationships  . Social Herbalist on phone: Not on file    Gets together: Not on file    Attends religious service: Not on file    Active member of club or organization: Not on file    Attends meetings of clubs or organizations: Not on file    Relationship  status: Not on file  Other Topics Concern  . Not on file  Social History Narrative   Lives with husband    Occupation: retired, was Art therapist   Edu: some college   Activity: stationary bicycle   Diet: good water, following slim fast diet, good vegetables     Family History: The patient's family history includes Alcohol abuse in her father; Alzheimer's disease in her maternal grandfather; Cancer in her mother; Dementia in her mother; Depression in her mother; Heart attack in her brother, brother, and father; Heart disease in her father; Hypertension in her brother; Osteoporosis in her father; Stroke in her brother and paternal grandfather; Stroke (age of onset: 47) in her mother; Stroke (age of onset: 26) in her brother. There is no history of Breast cancer.  ROS:   Please see the history of present illness.     All other systems reviewed and are negative.  EKGs/Labs/Other Studies Reviewed:    The following studies were reviewed today:  Lexiscan myocardial perfusion imaging stress test date 11/03/2019 Pharmacological myocardial perfusion imaging study with no significant  ischemia Normal wall motion, EF estimated at 53% No EKG changes concerning for ischemia at peak stress or in recovery. CT attenuation corrected images with mild thoracic descending aorta atherosclerosis, no significant coronary calcification noted Resting blood pressure 160 over 70s, remained 119 systolic following the test Low risk scan TTE 09/21/2019  1. Left ventricular ejection fraction, by visual estimation, is 60 to 65%. The left ventricle has normal  function. Normal left ventricular size. There is no left ventricular hypertrophy.  2. Global right ventricle has normal systolic function.The right ventricular size is normal. Right vetricular wall thickness was not assessed.  3. Left atrial size was normal.  4. Right atrial size was normal.  5. The mitral valve is normal in structure. No evidence of mitral valve regurgitation.  6. The tricuspid valve is normal in structure. Tricuspid valve regurgitation is trivial.  7. The aortic valve is tricuspid Aortic valve regurgitation is mild by color flow Doppler.  8. The pulmonic valve was normal in structure. Pulmonic valve regurgitation is not visualized by color flow Doppler.   2-week cardiac monitor date 08/29/2019 Patient had a min HR of 43 bpm, max HR of 144 bpm, and avg HR of 60 bpm. Predominant underlying rhythm was Sinus Rhythm. First Degree AV Block was present. Atrial Fibrillation occurred (2% burden), ranging from 45-144 bpm (avg of 79 bpm), the longest lasting 1 hour 23 mins with an avg rate of 77 bpm. Atrial Fibrillation was detected within +/- 45 seconds of symptomatic patient event(s). Isolated SVEs were rare (<1.0%), SVE Couplets were rare (<1.0%), and SVE Triplets were rare (<1.0%). Isolated VEs were rare (<1.0%), VE Couplets were rare (<1.0%), and no VE Triplets were present.  EKG:  EKG is  ordered today.  The ekg ordered today demonstrates normal sinus rhythm, possible old septal infarct.   Recent Labs: 07/21/2019: Magnesium 2.2; TSH 3.50 11/11/2019: ALT 25; BUN 16; Creatinine, Ser 0.79; Hemoglobin 14.6; Platelets 149; Potassium 4.2; Sodium 141  Recent Lipid Panel    Component Value Date/Time   CHOL 159 07/21/2019 0919   CHOL 144 11/19/2015 1119   TRIG 157.0 (H) 07/21/2019 0919   HDL 42.80 07/21/2019 0919   HDL 42 11/19/2015 1119   CHOLHDL 4 07/21/2019 0919   VLDL 31.4 07/21/2019 0919   LDLCALC 85 07/21/2019 0919   LDLCALC 71 11/19/2015 1119    Physical Exam:     VS:  BP 140/70 (BP Location: Left Arm, Patient Position: Sitting, Cuff Size: Normal)   Pulse 62   Temp (!) 96.7 F (35.9 C)   Ht 5\' 4"  (1.626 m)   Wt 159 lb 4 oz (72.2 kg)   SpO2 98%   BMI 27.34 kg/m     Wt Readings from Last 3 Encounters:  11/14/19 159 lb 4 oz (72.2 kg)  11/11/19 156 lb (70.8 kg)  10/20/19 159 lb 9.6 oz (72.4 kg)     GEN:  Well nourished, well developed in no acute distress HEENT: Normal NECK: No JVD; No carotid bruits LYMPHATICS: No lymphadenopathy CARDIAC: RRR, no murmurs, rubs, gallops RESPIRATORY:  Clear to auscultation without rales, wheezing or rhonchi  ABDOMEN: Soft, non-tender, non-distended MUSCULOSKELETAL:  No edema; No deformity  SKIN: Warm and dry NEUROLOGIC:  Alert and oriented x 3 PSYCHIATRIC:  Normal affect   ASSESSMENT:   Echocardiogram shows normal ejection fraction and normal diastolic function.  Lexiscan stress test did not show any evidence for ischemia.  Chads 2 vas score is 3.  Patient with symptomatic paroxysmal A. fib.  Agree with antiarrhythmics. 1. Paroxysmal atrial fibrillation ([place])   2. Mild aortic regurgitation   3. Pure hypercholesterolemia    PLAN:    In order of problems listed above:  1. Continue Eliquis 5 mg twice daily, Toprol-XL 25 mg daily.  Antiarrhythmics being planned by A. fib clinic.  2.  Monitor for now.  Repeat echocardiogram in 1 to 2 years.  3. Continue taking Lipitor as currently prescribed  Follow up in 6 months  Medication Adjustments/Labs and Tests Ordered: Current medicines are reviewed at length with the patient today.  Concerns regarding medicines are outlined above.  Orders Placed This Encounter  Procedures  . EKG 12-Lead   No orders of the defined types were placed in this encounter.   Patient Instructions  Medication Instructions:  Your physician recommends that you continue on your current medications as directed. Please refer to the Current Medication list given to you today.  *If  you need a refill on your cardiac medications before your next appointment, please call your pharmacy*  Lab Work: None ordered If you have labs (blood work) drawn today and your tests are completely normal, you will receive your results only by: Marland Kitchen MyChart Message (if you have MyChart) OR . A paper copy in the mail If you have any lab test that is abnormal or we need to change your treatment, we will call you to review the results.  Testing/Procedures: None ordered  Follow-Up: At [facility], you and your health needs are our priority.  As part of our continuing mission to provide you with exceptional heart care, we have created designated Provider Care Teams.  These Care Teams include your primary Cardiologist (physician) and Advanced Practice Providers (APPs -  Physician Assistants and Nurse Practitioners) who all work together to provide you with the care you need, when you need it.  Your next appointment:   6 month(s)  The format for your next appointment:   In Person  Provider:    You may see Kate Sable, MD or one of the following Advanced Practice Providers on your designated Care Team:    Murray Hodgkins, NP  Christell Faith, PA-C  Marrianne Mood, PA-C   Other Instructions Please follow up with the [facility] as planned. [phone]     Signed, Kate Sable, MD  11/14/2019 11:36 AM    [place]

## 2019-11-14 NOTE — Patient Instructions (Signed)
Medication Instructions:  Your physician recommends that you continue on your current medications as directed. Please refer to the Current Medication list given to you today.  *If you need a refill on your cardiac medications before your next appointment, please call your pharmacy*  Lab Work: None ordered If you have labs (blood work) drawn today and your tests are completely normal, you will receive your results only by: Marland Kitchen MyChart Message (if you have MyChart) OR . A paper copy in the mail If you have any lab test that is abnormal or we need to change your treatment, we will call you to review the results.  Testing/Procedures: None ordered  Follow-Up: At [facility], you and your health needs are our priority.  As part of our continuing mission to provide you with exceptional heart care, we have created designated Provider Care Teams.  These Care Teams include your primary Cardiologist (physician) and Advanced Practice Providers (APPs -  Physician Assistants and Nurse Practitioners) who all work together to provide you with the care you need, when you need it.  Your next appointment:   6 month(s)  The format for your next appointment:   In Person  Provider:    You may see Kate Sable, MD or one of the following Advanced Practice Providers on your designated Care Team:    Murray Hodgkins, NP  Christell Faith, PA-C  Marrianne Mood, PA-C   Other Instructions Please follow up with the [facility] as planned. [phone]

## 2019-11-17 ENCOUNTER — Other Ambulatory Visit: Payer: Self-pay

## 2019-11-17 ENCOUNTER — Ambulatory Visit
Admission: RE | Admit: 2019-11-17 | Discharge: 2019-11-17 | Disposition: A | Discharge status: Home / Self Care | Payer: Medicare Other | Source: Ambulatory Visit | Attending: Pulmonary Disease | Admitting: Pulmonary Disease

## 2019-11-17 ENCOUNTER — Encounter: Payer: Self-pay | Admitting: Pulmonary Disease

## 2019-11-17 ENCOUNTER — Encounter: Payer: Self-pay | Admitting: Family Medicine

## 2019-11-17 ENCOUNTER — Ambulatory Visit (INDEPENDENT_AMBULATORY_CARE_PROVIDER_SITE_OTHER): Payer: Medicare Other | Admitting: Family Medicine

## 2019-11-17 ENCOUNTER — Ambulatory Visit (INDEPENDENT_AMBULATORY_CARE_PROVIDER_SITE_OTHER): Payer: Medicare Other | Admitting: Pulmonary Disease

## 2019-11-17 VITALS — BP 136/74 | HR 64 | Temp 96.9°F | Ht 64.0 in | Wt 158.4 lb

## 2019-11-17 VITALS — BP 130/70 | HR 54 | Temp 98.3°F | Ht 64.0 in | Wt 157.6 lb

## 2019-11-17 DIAGNOSIS — R059 Cough, unspecified: Secondary | ICD-10-CM

## 2019-11-17 DIAGNOSIS — I480 Paroxysmal atrial fibrillation: Secondary | ICD-10-CM

## 2019-11-17 DIAGNOSIS — R0602 Shortness of breath: Secondary | ICD-10-CM

## 2019-11-17 DIAGNOSIS — K219 Gastro-esophageal reflux disease without esophagitis: Secondary | ICD-10-CM

## 2019-11-17 DIAGNOSIS — N201 Calculus of ureter: Secondary | ICD-10-CM | POA: Diagnosis not present

## 2019-11-17 DIAGNOSIS — R05 Cough: Secondary | ICD-10-CM

## 2019-11-17 LAB — POC URINALSYSI DIPSTICK (AUTOMATED)
Bilirubin, UA: NEGATIVE
Glucose, UA: NEGATIVE
Ketones, UA: NEGATIVE
Leukocytes, UA: NEGATIVE
Nitrite, UA: NEGATIVE
Protein, UA: NEGATIVE
Spec Grav, UA: 1.02 (ref 1.010–1.025)
Urobilinogen, UA: 0.2 E.U./dL
pH, UA: 5.5 (ref 5.0–8.0)

## 2019-11-17 MED ORDER — COMBIVENT RESPIMAT 20-100 MCG/ACT IN AERS: 1.0000 | INHALATION_SPRAY | Freq: Four times a day (QID) | RESPIRATORY_TRACT | 2 refills | Status: DC | PRN

## 2019-11-17 MED ORDER — ARNUITY ELLIPTA 100 MCG/ACT IN AEPB: 1.0000 | INHALATION_SPRAY | Freq: Every day | RESPIRATORY_TRACT | 3 refills | Status: DC

## 2019-11-17 NOTE — Assessment & Plan Note (Addendum)
ER records reviewed. She has passed small stone with improvement in L flank and LLQ pain. She did have mild hydroureter which anticipate has since resolved. Advised notify use if persistent discomfort or recurrent flank pain. Will send stone for analysis. Discussed importance of good hydration status. stone prevention diet handout provided today.

## 2019-11-17 NOTE — Patient Instructions (Signed)
1.  We can give you a trial of Arnuity Ellipta which is an anti-inflammatory for the airway.  That will be 1 puff daily.  2.  If you get into episodes of cough you can use Combivent 1 inhalation up to 4 times a day as needed for cough or shortness of breath.  3.  We will see him in follow-up in 3 months time.  4.  We have ordered a chest x-ray and we will check that and let you know the results.

## 2019-11-17 NOTE — Progress Notes (Signed)
This visit was conducted in person.  BP 130/70 (BP Location: Left Arm, Patient Position: Sitting, Cuff Size: Normal)   Pulse (!) 54   Temp 98.3 F (36.8 C) (Temporal)   Ht 5\' 4"  (1.626 m)   Wt 157 lb 9 oz (71.5 kg)   SpO2 98%   BMI 27.05 kg/m    CC: ER f/u visit Subjective:    Patient ID: Alexandria Moreno, female    DOB: 1941-01-08, 78 y.o.   MRN: [id number]  HPI: Alexandria Moreno is a 78 y.o. female presenting on 11/17/2019 for Hospitalization Follow-up (Seen at [facility] ED on 11/11/19.)   Recent ER visit for LLQ discomfort with hematuria and isolated episode of bloody loose stool s/p CT scan revealing distal 18mm L ureteral stone. Referred to urology. Passed stone 11/12/2019  CT without contrast IMPRESSION: 3 mm obstructing calculus of the distal left ureter. Mild proximal hydroureter but no hydronephrosis. Sigmoid diverticulosis without evidence of diverticulitis. Electronically Signed   By: Macy Mis M.D.   On: 11/11/2019 11:30   Recently found to have atrial fibrillation (2% burden) on event monitor in h/o palpitations. Started on eliquis 5mg  bid 09/2019. Referred to afib clinic to discuss possible antiarrhythmics.   Saw pulmonology for dyspnea and cough, ordered PFTs and trialed spiriva with benefit. Has f/u planned this afternoon.      Relevant past medical, surgical, family and social history reviewed and updated as indicated. Interim medical history since our last visit reviewed. Allergies and medications reviewed and updated. Outpatient Medications Prior to Visit  Medication Sig Dispense Refill  . apixaban (ELIQUIS) 5 MG TABS tablet Take 1 tablet (5 mg total) by mouth 2 (two) times daily. 60 tablet 6  . atorvastatin (LIPITOR) 40 MG tablet TAKE 1 TABLET DAILY 90 tablet 3  . B COMPLEX VITAMINS PO Take by mouth daily.    . Calcium Citrate (CITRACAL PO) Take 1 tablet by mouth at bedtime.    . chlorhexidine (PERIDEX) 0.12 % solution as needed.   0  . cycloSPORINE  (RESTASIS) 0.05 % ophthalmic emulsion Apply 0.05 drops to eye 2 (two) times daily.    . diphenoxylate-atropine (LOMOTIL) 2.5-0.025 MG tablet Take 1 tablet by mouth 4 (four) times daily. (Patient taking differently: Take 1 tablet by mouth as needed. ) 360 tablet 3  . glucosamine-chondroitin 500-400 MG tablet Take 1 tablet by mouth 2 (two) times daily.    . Methylcellulose, Laxative, (FIBER THERAPY PO) Take 3 tablets by mouth daily.    . MULTIPLE VITAMIN PO Take by mouth daily.    . OMEGA-3 FATTY ACIDS PO Take 1,200 mg by mouth daily.     Marland Kitchen omeprazole (PRILOSEC) 40 MG capsule Take 1 capsule (40 mg total) by mouth 2 (two) times a day. (Patient taking differently: Take 40 mg by mouth daily. ) 180 capsule 3  . Probiotic Product (PROBIOTIC PO) Take 1 tablet by mouth daily.    . rizatriptan (MAXALT) 10 MG tablet Take 10 mg by mouth daily as needed.    . topiramate (TOPAMAX) 100 MG tablet Take 100 mg by mouth 2 (two) times daily.    . TOPROL XL 25 MG 24 hr tablet TAKE 1 TABLET DAILY 90 tablet 3  . HYDROcodone-acetaminophen (NORCO) 5-325 MG tablet Take 1 tablet by mouth every 4 (four) hours as needed for moderate pain. 6 tablet 0  . ondansetron (ZOFRAN) 4 MG tablet Take 1 tablet (4 mg total) by mouth daily as needed. 14 tablet 0  . Tiotropium  Bromide Monohydrate (SPIRIVA RESPIMAT) 2.5 MCG/ACT AERS Inhale 2 puffs into the lungs daily for 1 day. 4 g 0   No facility-administered medications prior to visit.      Per HPI unless specifically indicated in ROS section below Review of Systems Objective:    BP 130/70 (BP Location: Left Arm, Patient Position: Sitting, Cuff Size: Normal)   Pulse (!) 54   Temp 98.3 F (36.8 C) (Temporal)   Ht 5\' 4"  (1.626 m)   Wt 157 lb 9 oz (71.5 kg)   SpO2 98%   BMI 27.05 kg/m   Wt Readings from Last 3 Encounters:  11/17/19 157 lb 9 oz (71.5 kg)  11/14/19 159 lb 4 oz (72.2 kg)  11/11/19 156 lb (70.8 kg)    Physical Exam Vitals signs and nursing note reviewed.   Constitutional:      General: She is not in acute distress.    Appearance: Normal appearance. She is not ill-appearing.  Cardiovascular:     Rate and Rhythm: Regular rhythm. Bradycardia present.     Pulses: Normal pulses.     Heart sounds: Normal heart sounds. No murmur.  Pulmonary:     Effort: Pulmonary effort is normal. No respiratory distress.     Breath sounds: Normal breath sounds. No wheezing, rhonchi or rales.  Abdominal:     General: Abdomen is flat. Bowel sounds are normal. There is no distension.     Palpations: Abdomen is soft. There is no mass.     Tenderness: There is abdominal tenderness (mild LLQ). There is no right CVA tenderness, left CVA tenderness, guarding or rebound.     Hernia: No hernia is present.  Neurological:     Mental Status: She is alert.  Psychiatric:        Mood and Affect: Mood normal.        Behavior: Behavior normal.       Results for orders placed or performed in visit on 11/17/19  POCT Urinalysis Dipstick (Automated)  Result Value Ref Range   Color, UA yellow    Clarity, UA clear    Glucose, UA Negative Negative   Bilirubin, UA negative    Ketones, UA negative    Spec Grav, UA 1.020 1.010 - 1.025   Blood, UA +/-    pH, UA 5.5 5.0 - 8.0   Protein, UA Negative Negative   Urobilinogen, UA 0.2 0.2 or 1.0 E.U./dL   Nitrite, UA negative    Leukocytes, UA Negative Negative   Assessment & Plan:  This visit occurred during the SARS-CoV-2 public health emergency.  Safety protocols were in place, including screening questions prior to the visit, additional usage of staff PPE, and extensive cleaning of exam room while observing appropriate contact time as indicated for disinfecting solutions.   Problem List Items Addressed This Visit    Ureterolithiasis - Primary    ER records reviewed. She has passed small stone with improvement in L flank and LLQ pain. She did have mild hydroureter which anticipate has since resolved. Advised notify use if  persistent discomfort or recurrent flank pain. Will send stone for analysis. Discussed importance of good hydration status. stone prevention diet handout provided today.       Relevant Orders   Stone analysis   POCT Urinalysis Dipstick (Automated) (Completed)   Paroxysmal atrial fibrillation (HCC)    Recent diagnosis - now on eliquis and discussing antiarrhythmics.  Appreciate cards/afib clinic care.  Sounds regular today.  No orders of the defined types were placed in this encounter.  Orders Placed This Encounter  Procedures  . Stone analysis  . POCT Urinalysis Dipstick (Automated)    Patient instructions: Urinalysis today Push fluids to keep urine clear. We will send stone for analysis.   Follow up plan: No follow-ups on file.  Ria Bush, MD

## 2019-11-17 NOTE — Assessment & Plan Note (Addendum)
Recent diagnosis - now on eliquis and discussing antiarrhythmics.  Appreciate cards/afib clinic care.  Sounds regular today.

## 2019-11-17 NOTE — Progress Notes (Signed)
Subjective:    Patient ID: Alexandria Moreno, female    DOB: 1941-09-29, 78 y.o.   MRN: [id number]  HPI Alexandria Moreno is a 78 year old remote former smoker who follows here for the issue of dyspnea and dry nonproductive cough.  Patient continues to have issues with gastroesophageal reflux.  She also continues to have issues with palpitations which is what aggravates her shortness of breath.  She had pulmonary function testing performed on 07 November 2019.  This shows an FEV1 of 2.34 L or 116% predicted with no bronchodilator response.  FVC was 2.86 L or 106% predicted.  FEV1/FVC was 82%.  Lung volumes were normal.  Flow volume loop was normal.  Diffusion capacity was normal.  Patient did not find relief from Spiriva.  Her palpitations have been more control as of late.  No chest pain, paroxysmal nocturnal dyspnea, orthopnea, lower extremity edema, calf tenderness.  Cough has been dry and nonproductive, no hemoptysis.  Cough does have some relationship with meals at times but not at others.  It may happen randomly.  The results were discussed with the patient.   Review of Systems A 10 point review of systems was performed and it is as noted above otherwise negative.  Allergies  Allergen Reactions  . Aspirin Other (See Comments)    GI bleed  . Baclofen Diarrhea  . Codeine Nausea And Vomiting  . Imitrex  [Sumatriptan]     Palpatations  . Influenza Vaccines Swelling  . Vicodin  [Hydrocodone-Acetaminophen] Nausea And Vomiting   Medication list was reviewed with the patient.      Objective:   Physical Exam BP 136/74 (BP Location: Left Arm, Cuff Size: Normal)   Pulse 64   Temp (!) 96.9 F (36.1 C) (Temporal)   Ht 5\' 4"  (1.626 m)   Wt 158 lb 6.4 oz (71.8 kg)   SpO2 97%   BMI 27.19 kg/m   GENERAL: Awake, well-developed well-nourished woman in no acute distress.  Fully ambulatory. HEAD: Normocephalic, atraumatic.  EYES: Pupils equal, round, reactive to light.  No scleral icterus.   MOUTH: Nose/mouth/throat not examined due to masking requirements for COVID 19.  NECK: Supple. No thyromegaly. Trachea midline. No JVD.  No adenopathy. PULMONARY: Lungs clear to auscultation bilaterally. CARDIOVASCULAR: S1 and S2. Regular rate and rhythm.  No rubs, murmurs or gallops heard. GASTROINTESTINAL: Benign. MUSCULOSKELETAL: No joint deformity, no clubbing, no edema.  NEUROLOGIC: Awake, alert, fully oriented, no overt focal deficits.  Speech is fluent. SKIN: Intact,warm,dry.  No rashes. PSYCH: Mood and behavior appropriate.      Assessment & Plan:     ICD-10-CM   1. Cough  R05 CXR 2view   Chest x-ray will be obtained Trial of Arnuity with as needed Combivent  2. SOB (shortness of breath)  R06.02    PFTs normal Doubt pulmonary etiology May be deconditioning  3. Gastroesophageal reflux disease, unspecified whether esophagitis present  K21.9    Significant, persistent issues recommend revisit with GI Patient is established with Dr. [facility] Norris  4. Paroxysmal atrial fibrillation (HCC)  I48.0    This issue adds complexity to her management   Discussion:  Because of persistent cough will obtain chest x-ray.  She still appears to be having significant issues with her gastroesophageal reflux disease and I have recommended that she revisit with Dr. [facility] Norris.  We will give her a trial of Arnuity Ellipta in the event that this may be related to eosinophilic bronchitis.  She also received Combivent to use as needed.  Follow-up in 3 months time she is to contact us prior to that time should any new difficulties arise.   Renold Don, MD [facility]   *This note was dictated using voice recognition software/Dragon.  Despite best efforts to proofread, errors can occur which can change the meaning.  Any change was purely unintentional.

## 2019-11-17 NOTE — Progress Notes (Deleted)
   Subjective:    Patient ID: Alexandria Moreno, female    DOB: 22-Apr-1941, 78 y.o.   MRN: [id number]  HPI    Review of Systems     Objective:   Physical Exam        Assessment & Plan:

## 2019-11-17 NOTE — Patient Instructions (Signed)
Urinalysis today Push fluids to keep urine clear. We will send stone for analysis.   Dietary Guidelines to Help Prevent Kidney Stones Kidney stones are deposits of minerals and salts that form inside your kidneys. Your risk of developing kidney stones may be greater depending on your diet, your lifestyle, the medicines you take, and whether you have certain medical conditions. Most people can reduce their chances of developing kidney stones by following the instructions below. Depending on your overall health and the type of kidney stones you tend to develop, your dietitian may give you more specific instructions. What are tips for following this plan? Reading food labels  Choose foods with "no salt added" or "low-salt" labels. Limit your sodium intake to less than 1500 mg per day.  Choose foods with calcium for each meal and snack. Try to eat about 300 mg of calcium at each meal. Foods that contain 200-500 mg of calcium per serving include: ? 8 oz (237 ml) of milk, fortified nondairy milk, and fortified fruit juice. ? 8 oz (237 ml) of kefir, yogurt, and soy yogurt. ? 4 oz (118 ml) of tofu. ? 1 oz of cheese. ? 1 cup (300 g) of dried figs. ? 1 cup (91 g) of cooked broccoli. ? 1-3 oz can of sardines or mackerel.  Most people need 1000 to 1500 mg of calcium each day. Talk to your dietitian about how much calcium is recommended for you. Shopping  Buy plenty of fresh fruits and vegetables. Most people do not need to avoid fruits and vegetables, even if they contain nutrients that may contribute to kidney stones.  When shopping for convenience foods, choose: ? Whole pieces of fruit. ? Premade salads with dressing on the side. ? Low-fat fruit and yogurt smoothies.  Avoid buying frozen meals or prepared deli foods.  Look for foods with live cultures, such as yogurt and kefir. Cooking  Do not add salt to food when cooking. Place a salt shaker on the table and allow each person to add his or  her own salt to taste.  Use vegetable protein, such as beans, textured vegetable protein (TVP), or tofu instead of meat in pasta, casseroles, and soups. Meal planning   Eat less salt, if told by your dietitian. To do this: ? Avoid eating processed or premade food. ? Avoid eating fast food.  Eat less animal protein, including cheese, meat, poultry, or fish, if told by your dietitian. To do this: ? Limit the number of times you have meat, poultry, fish, or cheese each week. Eat a diet free of meat at least 2 days a week. ? Eat only one serving each day of meat, poultry, fish, or seafood. ? When you prepare animal protein, cut pieces into small portion sizes. For most meat and fish, one serving is about the size of one deck of cards.  Eat at least 5 servings of fresh fruits and vegetables each day. To do this: ? Keep fruits and vegetables on hand for snacks. ? Eat 1 piece of fruit or a handful of berries with breakfast. ? Have a salad and fruit at lunch. ? Have two kinds of vegetables at dinner.  Limit foods that are high in a substance called oxalate. These include: ? Spinach. ? Rhubarb. ? Beets. ? Potato chips and french fries. ? Nuts.  If you regularly take a diuretic medicine, make sure to eat at least 1-2 fruits or vegetables high in potassium each day. These include: ? Avocado. ? Banana. ?  Orange, prune, carrot, or tomato juice. ? Baked potato. ? Cabbage. ? Beans and split peas. General instructions   Drink enough fluid to keep your urine clear or pale yellow. This is the most important thing you can do.  Talk to your health care provider and dietitian about taking daily supplements. Depending on your health and the cause of your kidney stones, you may be advised: ? Not to take supplements with vitamin C. ? To take a calcium supplement. ? To take a daily probiotic supplement. ? To take other supplements such as magnesium, fish oil, or vitamin B6.  Take all medicines  and supplements as told by your health care provider.  Limit alcohol intake to no more than 1 drink a day for nonpregnant women and 2 drinks a day for men. One drink equals 12 oz of beer, 5 oz of wine, or 1 oz of hard liquor.  Lose weight if told by your health care provider. Work with your dietitian to find strategies and an eating plan that works best for you. What foods are not recommended? Limit your intake of the following foods, or as told by your dietitian. Talk to your dietitian about specific foods you should avoid based on the type of kidney stones and your overall health. Grains Breads. Bagels. Rolls. Baked goods. Salted crackers. Cereal. Pasta. Vegetables Spinach. Rhubarb. Beets. Canned vegetables. Angie Fava. Olives. Meats and other protein foods Nuts. Nut butters. Large portions of meat, poultry, or fish. Salted or cured meats. Deli meats. Hot dogs. Sausages. Dairy Cheese. Beverages Regular soft drinks. Regular vegetable juice. Seasonings and other foods Seasoning blends with salt. Salad dressings. Canned soups. Soy sauce. Ketchup. Barbecue sauce. Canned pasta sauce. Casseroles. Pizza. Lasagna. Frozen meals. Potato chips. [place] fries. Summary  You can reduce your risk of kidney stones by making changes to your diet.  The most important thing you can do is drink enough fluid. You should drink enough fluid to keep your urine clear or pale yellow.  Ask your health care provider or dietitian how much protein from animal sources you should eat each day, and also how much salt and calcium you should have each day. This information is not intended to replace advice given to you by your health care provider. Make sure you discuss any questions you have with your health care provider. Document Released: 03/28/2011 Document Revised: 03/23/2019 Document Reviewed: 11/11/2016 Elsevier Patient Education  2020 [employer].

## 2019-11-23 ENCOUNTER — Other Ambulatory Visit (HOSPITAL_COMMUNITY): Payer: Self-pay | Admitting: *Deleted

## 2019-11-23 ENCOUNTER — Encounter (HOSPITAL_COMMUNITY): Payer: Self-pay | Admitting: Nurse Practitioner

## 2019-11-23 ENCOUNTER — Other Ambulatory Visit: Payer: Self-pay

## 2019-11-23 ENCOUNTER — Ambulatory Visit (HOSPITAL_COMMUNITY)
Admission: RE | Admit: 2019-11-23 | Discharge: 2019-11-23 | Disposition: A | Discharge status: Home / Self Care | Payer: Medicare Other | Source: Ambulatory Visit | Attending: Nurse Practitioner | Admitting: Nurse Practitioner

## 2019-11-23 VITALS — BP 140/66 | HR 60 | Ht 64.0 in | Wt 159.8 lb

## 2019-11-23 DIAGNOSIS — Z87891 Personal history of nicotine dependence: Secondary | ICD-10-CM | POA: Insufficient documentation

## 2019-11-23 DIAGNOSIS — Z7901 Long term (current) use of anticoagulants: Secondary | ICD-10-CM | POA: Diagnosis not present

## 2019-11-23 DIAGNOSIS — Z823 Family history of stroke: Secondary | ICD-10-CM | POA: Diagnosis not present

## 2019-11-23 DIAGNOSIS — Z885 Allergy status to narcotic agent status: Secondary | ICD-10-CM | POA: Insufficient documentation

## 2019-11-23 DIAGNOSIS — Z79899 Other long term (current) drug therapy: Secondary | ICD-10-CM | POA: Insufficient documentation

## 2019-11-23 DIAGNOSIS — I480 Paroxysmal atrial fibrillation: Secondary | ICD-10-CM

## 2019-11-23 DIAGNOSIS — Z886 Allergy status to analgesic agent status: Secondary | ICD-10-CM | POA: Insufficient documentation

## 2019-11-23 DIAGNOSIS — Z8249 Family history of ischemic heart disease and other diseases of the circulatory system: Secondary | ICD-10-CM | POA: Insufficient documentation

## 2019-11-23 DIAGNOSIS — Z881 Allergy status to other antibiotic agents status: Secondary | ICD-10-CM | POA: Diagnosis not present

## 2019-11-23 DIAGNOSIS — Z8049 Family history of malignant neoplasm of other genital organs: Secondary | ICD-10-CM | POA: Insufficient documentation

## 2019-11-23 DIAGNOSIS — Z85828 Personal history of other malignant neoplasm of skin: Secondary | ICD-10-CM | POA: Diagnosis not present

## 2019-11-23 DIAGNOSIS — K219 Gastro-esophageal reflux disease without esophagitis: Secondary | ICD-10-CM | POA: Insufficient documentation

## 2019-11-23 DIAGNOSIS — Z90710 Acquired absence of both cervix and uterus: Secondary | ICD-10-CM | POA: Insufficient documentation

## 2019-11-23 DIAGNOSIS — E785 Hyperlipidemia, unspecified: Secondary | ICD-10-CM | POA: Insufficient documentation

## 2019-11-23 DIAGNOSIS — R9431 Abnormal electrocardiogram [ECG] [EKG]: Secondary | ICD-10-CM | POA: Insufficient documentation

## 2019-11-23 DIAGNOSIS — Z887 Allergy status to serum and vaccine status: Secondary | ICD-10-CM | POA: Insufficient documentation

## 2019-11-23 LAB — STONE ANALYSIS: Stone Weight: 0.028 g

## 2019-11-23 MED ORDER — METOPROLOL SUCCINATE ER 25 MG PO TB24: 12.5000 mg | ORAL_TABLET | Freq: Every day | ORAL | 3 refills | Status: DC

## 2019-11-23 MED ORDER — MULTAQ 400 MG PO TABS: 400.0000 mg | ORAL_TABLET | Freq: Two times a day (BID) | ORAL | 3 refills | Status: DC

## 2019-11-23 NOTE — Progress Notes (Signed)
Primary Care Physician: Ria Bush, MD Referring Physician: Ignacia Bayley, NP   Alexandria Moreno is a 78 y.o. female with a h/o rheumatic fever at age 50, mild valvular disease, hyperlipidemia, iron deficiency anemia without history of GI bleed, GERD, migraines, and palpitations, who presents for follow-up after recent finding of atrial fibrillation on event monitoring.  She has been on Toprol XL 25 mg daily, for years, HR's in the 60's at baseline. Review of her monitor  shows 2% afib burden, with longest episode  one hour duration, mostly rate controlled below 100 bpm. She was started on eliquis 5 mg bid for CHA2DS2VASc score of 3.  She also tells me that she is experiencing  more  fatigue than  she has had in the past. She also describes exertional  left lateral  neck pain and upper back discomfort when walking with her husband that is new over the last several weeks. Since these episodes are daily they do not  seem to correlate with afib episodes. She has soft snoring but no apnea per husband so she feels that she does not need a sleep study as suggested by [employer].   Return to clinic, 11/23/19. She reports that she continues to have daily episodes of afib that do not last long,around 15 mins but feels wiped out afterward. She had a stress test  which did not show any ischemia. She is here to discuss use of  further antiarrythmic drugs. She does have a HR that is often in the mid to upper 50's.  Today, she denies symptoms of palpitations, chest pain, shortness of breath, orthopnea, PND, lower extremity edema, dizziness, presyncope, syncope, or neurologic sequela. The patient is tolerating medications without difficulties and is otherwise without complaint today.   Past Medical History:  Diagnosis Date  . Diverticulosis   . DJD (degenerative joint disease) of knee   . GERD (gastroesophageal reflux disease)   . H/o Iron deficiency anemia 2009   a. 05/2008 EGD/Colonoscopy: nl w/o evidence of  bleeding; b. 11/2017 EGD: nl. Colonoscopy: 6mm cecal polyp, otw nl.  . H/O transfusion of whole blood 2009  . H/O: rheumatic fever    78 years old  . History of chicken pox   . History of echocardiogram    a. 09/2019 Echo: EF 60-65%, nl RV size/fxn. Nl Atrial sizes. Triv TR. Mild AI.  Marland Kitchen Hx of basal cell carcinoma    multiple sites  . 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 Hyperlipidemia   . Migraines   . PAF (paroxysmal atrial fibrillation) ([place])    a. 08/2019 Zio: Avg HR 60 (min 43, max 144). PAF (2% burden), longest 1h 37m (avg rate 77). Rare PAC's and PVC's.  . Palpitation   . Pelvic prolapse    with rectocele  . Rosacea   . Seasonal allergic rhinitis    mild  . Stress incontinence    Past Surgical History:  Procedure Laterality Date  . ABDOMINAL HYSTERECTOMY  1981  . APPENDECTOMY  1960  . capsule endoscopy  2009   WNL  . CATARACT EXTRACTION Bilateral [id number]  . COLONOSCOPY  2009   diverticulosis ([place])  . COLONOSCOPY WITH PROPOFOL N/A 12/01/2017   TA, rpt 5 yrs ([place])  . DEXA  06/2016   T -0.2 hip, 0.7 spine  . ESOPHAGOGASTRODUODENOSCOPY  2009   focal mild chronic gastritis, neg H pylori ([place])  . ESOPHAGOGASTRODUODENOSCOPY (EGD) WITH PROPOFOL N/A 12/01/2017   Procedure:  ESOPHAGOGASTRODUODENOSCOPY (EGD) WITH PROPOFOL;  Surgeon: Lucilla Lame, MD;  Location: [facility] ENDOSCOPY;  Service: Endoscopy;  Laterality: N/A;  . INCONTINENCE SURGERY  2001   with mesh  . LIPOMA EXCISION  1971   right shoulder  . [place]  . OVARIAN CYST REMOVAL  1960  . SALPINGOOPHORECTOMY Left 2001  . TONSILLECTOMY  1958    Current Outpatient Medications  Medication Sig Dispense Refill  . apixaban (ELIQUIS) 5 MG TABS tablet Take 1 tablet (5 mg total) by mouth 2 (two) times daily. 60 tablet 6  . atorvastatin (LIPITOR) 40 MG tablet TAKE 1 TABLET DAILY 90 tablet 3  . B COMPLEX VITAMINS PO Take by mouth daily.    . Calcium Citrate (CITRACAL PO)  Take 1 tablet by mouth at bedtime.    . chlorhexidine (PERIDEX) 0.12 % solution as needed.   0  . cycloSPORINE (RESTASIS) 0.05 % ophthalmic emulsion Apply 0.05 drops to eye 2 (two) times daily.    . diphenoxylate-atropine (LOMOTIL) 2.5-0.025 MG tablet Take 1 tablet by mouth 4 (four) times daily. (Patient taking differently: Take 1 tablet by mouth as needed. ) 360 tablet 3  . Fluticasone Furoate (ARNUITY ELLIPTA) 100 MCG/ACT AEPB Inhale 1 puff into the lungs daily. 90 each 3  . glucosamine-chondroitin 500-400 MG tablet Take 1 tablet by mouth 2 (two) times daily.    . Ipratropium-Albuterol (COMBIVENT RESPIMAT) 20-100 MCG/ACT AERS respimat Inhale 1 puff into the lungs every 6 (six) hours as needed for shortness of breath (Cough). 8 g 2  . Methylcellulose, Laxative, (FIBER THERAPY PO) Take 3 tablets by mouth daily.    . MULTIPLE VITAMIN PO Take by mouth daily.    . OMEGA-3 FATTY ACIDS PO Take 1,200 mg by mouth daily.     Marland Kitchen omeprazole (PRILOSEC) 40 MG capsule Take 1 capsule (40 mg total) by mouth 2 (two) times a day. (Patient taking differently: Take 40 mg by mouth daily. ) 180 capsule 3  . Probiotic Product (PROBIOTIC PO) Take 1 tablet by mouth daily.    . rizatriptan (MAXALT) 10 MG tablet Take 10 mg by mouth daily as needed.    . topiramate (TOPAMAX) 100 MG tablet Take 100 mg by mouth 2 (two) times daily.    . TOPROL XL 25 MG 24 hr tablet TAKE 1 TABLET DAILY 90 tablet 3   No current facility-administered medications for this encounter.     Allergies  Allergen Reactions  . Aspirin Other (See Comments)    GI bleed  . Baclofen Diarrhea  . Codeine Nausea And Vomiting  . Imitrex  [Sumatriptan]     Palpatations  . Influenza Vaccines Swelling  . Vicodin  [Hydrocodone-Acetaminophen] Nausea And Vomiting    Social History   Socioeconomic History  . Marital status: Married    Spouse name: Not on file  . Number of children: Not on file  . Years of education: Not on file  . Highest education  level: Not on file  Occupational History  . Not on file  Social Needs  . Financial resource strain: Not on file  . Food insecurity    Worry: Not on file    Inability: Not on file  . Transportation needs    Medical: Not on file    Non-medical: Not on file  Tobacco Use  . Smoking status: Former Smoker    Packs/day: 0.50    Years: 20.00    Pack years: 10.00    Types: Cigarettes  Quit date: 12/15/1978    Years since quitting: 40.9  . Smokeless tobacco: Never Used  Substance and Sexual Activity  . Alcohol use: No    Alcohol/week: 0.0 standard drinks    Comment: Rare  . Drug use: No  . Sexual activity: Never  Lifestyle  . Physical activity    Days per week: Not on file    Minutes per session: Not on file  . Stress: Not on file  Relationships  . Social Herbalist on phone: Not on file    Gets together: Not on file    Attends religious service: Not on file    Active member of club or organization: Not on file    Attends meetings of clubs or organizations: Not on file    Relationship status: Not on file  . Intimate partner violence    Fear of current or ex partner: Not on file    Emotionally abused: Not on file    Physically abused: Not on file    Forced sexual activity: Not on file  Other Topics Concern  . Not on file  Social History Narrative   Lives with husband    Occupation: retired, was Art therapist   Edu: some college   Activity: stationary bicycle   Diet: good water, following slim fast diet, good vegetables    Family History  Problem Relation Age of Onset  . Stroke Mother 48  . Cancer Mother        Cervical  . Depression Mother   . Dementia Mother        vascular  . Heart disease Father        CABG  . Alcohol abuse Father   . Osteoporosis Father   . Heart attack Father   . Hypertension Brother   . Heart attack Brother   . Stroke Brother   . Stroke Brother 96  . Heart attack Brother   . Stroke Paternal Grandfather   . Alzheimer's  disease Maternal Grandfather   . Breast cancer Neg Hx     ROS- All systems are reviewed and negative except as per the HPI above  Physical Exam: Vitals:   11/23/19 1014  BP: 140/66  Pulse: 60  Weight: 72.5 kg  Height: 5\' 4"  (1.626 m)   Wt Readings from Last 3 Encounters:  11/23/19 72.5 kg  11/17/19 71.8 kg  11/17/19 71.5 kg    Labs: Lab Results  Component Value Date   NA 141 11/11/2019   K 4.2 11/11/2019   CL 111 11/11/2019   CO2 22 11/11/2019   GLUCOSE 94 11/11/2019   BUN 16 11/11/2019   CREATININE 0.79 11/11/2019   CALCIUM 8.9 11/11/2019   MG 2.2 07/21/2019   Lab Results  Component Value Date   INR 1.1 11/11/2019   Lab Results  Component Value Date   CHOL 159 07/21/2019   HDL 42.80 07/21/2019   LDLCALC 85 07/21/2019   TRIG 157.0 (H) 07/21/2019     GEN- The patient is well appearing, alert and oriented x 3 today.   Head- normocephalic, atraumatic Eyes-  Sclera clear, conjunctiva pink Ears- hearing intact Oropharynx- clear Neck- supple, no JVP Lymph- no cervical lymphadenopathy Lungs- Clear to ausculation bilaterally, normal work of breathing Heart- Regular rate and rhythm, no murmurs, rubs or gallops, PMI not laterally displaced GI- soft, NT, ND, + BS Extremities- no clubbing, cyanosis, or edema MS- no significant deformity or atrophy Skin- no rash or lesion Psych- euthymic mood,  full affect Neuro- strength and sensation are intact  EKG-NSR at 61 bpm, pr int 194 ms, qrs int 82 ms, qtc 398 ms Epic records reviewed    Assessment and Plan: 1.Paroxosysmal afib Pt feels that afib burden has increased  Discussed multaq vrs flecainide  Will start Multaq 400 mg bid  with food and reduce toprol to 12.5 mg daily  If she does not tolerate multaq will try flecainide  Recent stress test no ischemia  PharmD cleared both drugs with her current drug list   2. CHA2DS2VASc score of 3 Continue eliquis 5 mg bid  Bleeding precautions  discussed   Repeat ekg  in 3 days of starting drug  Butch Penny C. Mete Purdum, [facility] [address] [place], [place] [address] [phone]

## 2019-12-01 ENCOUNTER — Other Ambulatory Visit: Payer: Self-pay

## 2019-12-01 ENCOUNTER — Ambulatory Visit (HOSPITAL_COMMUNITY)
Admission: RE | Admit: 2019-12-01 | Discharge: 2019-12-01 | Disposition: A | Discharge status: Home / Self Care | Payer: Medicare Other | Source: Ambulatory Visit | Attending: Nurse Practitioner | Admitting: Nurse Practitioner

## 2019-12-01 DIAGNOSIS — I444 Left anterior fascicular block: Secondary | ICD-10-CM | POA: Diagnosis not present

## 2019-12-01 DIAGNOSIS — I480 Paroxysmal atrial fibrillation: Secondary | ICD-10-CM | POA: Diagnosis present

## 2019-12-01 NOTE — Progress Notes (Signed)
Pt in for repeat ekg having started on multaq 400 mg bid for PAF on Monday. She is tolerating from a GI standpoint. She is taking with food. QTc is stable at 394 ms. [place] for ekg and cmet in 3 weeks . HAs not noted any afib since starting drug.

## 2019-12-07 ENCOUNTER — Telehealth: Payer: Self-pay | Admitting: Pulmonary Disease

## 2019-12-07 NOTE — Telephone Encounter (Signed)
Stop flovent Continue arnuity

## 2019-12-07 NOTE — Telephone Encounter (Signed)
Called and spoke to pt.  Pt stated that combivent was not covered by insurance, so pharmacy gave her flovent.  Pt is also taking arnuity daily.  Pt stated that she started flovent 3 days and she has developed bumps in her mouth and sore tongue. Pt is rinsing her mouth twice after each use. Pt also noted that breathing has improved with arnuity, therefore she is concerned that flovent may not be needed.  DK please advise, as LG is unavailable

## 2019-12-07 NOTE — Telephone Encounter (Signed)
Pt is aware of below recommendations and voiced her understanding. Nothing further is needed.  

## 2019-12-21 ENCOUNTER — Encounter (HOSPITAL_COMMUNITY): Payer: Self-pay | Admitting: Nurse Practitioner

## 2019-12-21 ENCOUNTER — Ambulatory Visit (HOSPITAL_COMMUNITY)
Admission: RE | Admit: 2019-12-21 | Discharge: 2019-12-21 | Disposition: A | Discharge status: Home / Self Care | Payer: Medicare Other | Source: Ambulatory Visit | Attending: Nurse Practitioner | Admitting: Nurse Practitioner

## 2019-12-21 ENCOUNTER — Other Ambulatory Visit: Payer: Self-pay

## 2019-12-21 VITALS — BP 170/74 | HR 61 | Ht 64.0 in | Wt 161.6 lb

## 2019-12-21 DIAGNOSIS — Z79899 Other long term (current) drug therapy: Secondary | ICD-10-CM | POA: Diagnosis not present

## 2019-12-21 DIAGNOSIS — M1710 Unilateral primary osteoarthritis, unspecified knee: Secondary | ICD-10-CM | POA: Insufficient documentation

## 2019-12-21 DIAGNOSIS — K219 Gastro-esophageal reflux disease without esophagitis: Secondary | ICD-10-CM | POA: Insufficient documentation

## 2019-12-21 DIAGNOSIS — Z8249 Family history of ischemic heart disease and other diseases of the circulatory system: Secondary | ICD-10-CM | POA: Insufficient documentation

## 2019-12-21 DIAGNOSIS — D509 Iron deficiency anemia, unspecified: Secondary | ICD-10-CM | POA: Diagnosis not present

## 2019-12-21 DIAGNOSIS — D6869 Other thrombophilia: Secondary | ICD-10-CM | POA: Diagnosis not present

## 2019-12-21 DIAGNOSIS — E785 Hyperlipidemia, unspecified: Secondary | ICD-10-CM | POA: Insufficient documentation

## 2019-12-21 DIAGNOSIS — I10 Essential (primary) hypertension: Secondary | ICD-10-CM | POA: Insufficient documentation

## 2019-12-21 DIAGNOSIS — I480 Paroxysmal atrial fibrillation: Secondary | ICD-10-CM | POA: Insufficient documentation

## 2019-12-21 DIAGNOSIS — Z87891 Personal history of nicotine dependence: Secondary | ICD-10-CM | POA: Diagnosis not present

## 2019-12-21 DIAGNOSIS — Z7901 Long term (current) use of anticoagulants: Secondary | ICD-10-CM | POA: Insufficient documentation

## 2019-12-21 LAB — COMPREHENSIVE METABOLIC PANEL
ALT: 27 U/L (ref 0–44)
AST: 29 U/L (ref 15–41)
Albumin: 3.6 g/dL (ref 3.5–5.0)
Alkaline Phosphatase: 91 U/L (ref 38–126)
Anion gap: 6 (ref 5–15)
BUN: 16 mg/dL (ref 8–23)
CO2: 22 mmol/L (ref 22–32)
Calcium: 8.9 mg/dL (ref 8.9–10.3)
Chloride: 112 mmol/L — ABNORMAL HIGH (ref 98–111)
Creatinine, Ser: 0.81 mg/dL (ref 0.44–1.00)
GFR calc Af Amer: >60 mL/min (ref >60)
GFR calc non Af Amer: >60 mL/min (ref >60)
Glucose, Bld: 94 mg/dL (ref 70–99)
Potassium: 4.2 mmol/L (ref 3.5–5.1)
Sodium: 140 mmol/L (ref 135–145)
Total Bilirubin: 0.2 mg/dL — ABNORMAL LOW (ref 0.3–1.2)
Total Protein: 6.1 g/dL — ABNORMAL LOW (ref 6.5–8.1)

## 2019-12-21 MED ORDER — MULTAQ 400 MG PO TABS: 400.0000 mg | ORAL_TABLET | Freq: Two times a day (BID) | ORAL | 3 refills | Status: DC

## 2019-12-21 NOTE — Progress Notes (Signed)
Primary Care Physician: Ria Bush, MD Referring Physician: Ignacia Bayley, NP   Alexandria Moreno is a 79 y.o. female with a h/o rheumatic fever at age 54, mild valvular disease, hyperlipidemia, iron deficiency anemia without history of GI bleed, GERD, migraines, and palpitations, who presents for follow-up after recent finding of atrial fibrillation on event monitoring.  She has been on Toprol XL 25 mg daily, for years, HR's in the 60's at baseline. Review of her monitor  shows 2% afib burden, with longest episode  one hour duration, mostly rate controlled below 100 bpm. She was started on eliquis 5 mg bid for CHA2DS2VASc score of 3.  She also tells me that she is experiencing  more  fatigue than  she has had in the past. She also describes exertional  left lateral  neck pain and upper back discomfort when walking with her husband that is new over the last several weeks. Since these episodes are daily they do not  seem to correlate with afib episodes. She has soft snoring but no apnea per husband so she feels that she does not need a sleep study as suggested by [employer].   F/u in afib clinic, 12/21/19. . She is doing well on  Multaq. Only one episode of afib that lasted a few hours. She is tolerating  From a GI standpoint. She  is pleased with drug.    Today, she denies symptoms of palpitations, chest pain, shortness of breath, orthopnea, PND, lower extremity edema, dizziness, presyncope, syncope, or neurologic sequela. The patient is tolerating medications without difficulties and is otherwise without complaint today.   Past Medical History:  Diagnosis Date  . Diverticulosis   . DJD (degenerative joint disease) of knee   . GERD (gastroesophageal reflux disease)   . H/o Iron deficiency anemia 2009   a. 05/2008 EGD/Colonoscopy: nl w/o evidence of bleeding; b. 11/2017 EGD: nl. Colonoscopy: 64mm cecal polyp, otw nl.  . H/O transfusion of whole blood 2009  . H/O: rheumatic fever    79 years old  .  History of chicken pox   . History of echocardiogram    a. 09/2019 Echo: EF 60-65%, nl RV size/fxn. Nl Atrial sizes. Triv TR. Mild AI.  Marland Kitchen Hx of basal cell carcinoma    multiple sites  . 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 Hyperlipidemia   . Migraines   . PAF (paroxysmal atrial fibrillation) ([place])    a. 08/2019 Zio: Avg HR 60 (min 43, max 144). PAF (2% burden), longest 1h 60m (avg rate 77). Rare PAC's and PVC's.  . Palpitation   . Pelvic prolapse    with rectocele  . Rosacea   . Seasonal allergic rhinitis    mild  . Stress incontinence    Past Surgical History:  Procedure Laterality Date  . ABDOMINAL HYSTERECTOMY  1981  . APPENDECTOMY  1960  . capsule endoscopy  2009   WNL  . CATARACT EXTRACTION Bilateral [id number]  . COLONOSCOPY  2009   diverticulosis ([place])  . COLONOSCOPY WITH PROPOFOL N/A 12/01/2017   TA, rpt 5 yrs ([place])  . DEXA  06/2016   T -0.2 hip, 0.7 spine  . ESOPHAGOGASTRODUODENOSCOPY  2009   focal mild chronic gastritis, neg H pylori ([place])  . ESOPHAGOGASTRODUODENOSCOPY (EGD) WITH PROPOFOL N/A 12/01/2017   Procedure: ESOPHAGOGASTRODUODENOSCOPY (EGD) WITH PROPOFOL;  Surgeon: Lucilla Lame, MD;  [facility];  Service: Endoscopy;  Laterality: N/A;  . INCONTINENCE SURGERY  2001  with mesh  . LIPOMA EXCISION  1971   right shoulder  . [place]  . OVARIAN CYST REMOVAL  1960  . SALPINGOOPHORECTOMY Left 2001  . TONSILLECTOMY  1958    Current Outpatient Medications  Medication Sig Dispense Refill  . apixaban (ELIQUIS) 5 MG TABS tablet Take 1 tablet (5 mg total) by mouth 2 (two) times daily. 60 tablet 6  . atorvastatin (LIPITOR) 40 MG tablet TAKE 1 TABLET DAILY 90 tablet 3  . B COMPLEX VITAMINS PO Take by mouth daily.    . Calcium Citrate (CITRACAL PO) Take 1 tablet by mouth at bedtime.    . chlorhexidine (PERIDEX) 0.12 % solution as needed.   0  . cycloSPORINE (RESTASIS) 0.05 % ophthalmic emulsion  Apply 0.05 drops to eye 2 (two) times daily.    . diphenoxylate-atropine (LOMOTIL) 2.5-0.025 MG tablet Take 1 tablet by mouth 4 (four) times daily. (Patient taking differently: Take 1 tablet by mouth as needed. ) 360 tablet 3  . dronedarone (MULTAQ) 400 MG tablet Take 1 tablet (400 mg total) by mouth 2 (two) times daily with a meal. 180 tablet 3  . glucosamine-chondroitin 500-400 MG tablet Take 1 tablet by mouth 2 (two) times daily.    . Ipratropium-Albuterol (COMBIVENT RESPIMAT) 20-100 MCG/ACT AERS respimat Inhale 1 puff into the lungs every 6 (six) hours as needed for shortness of breath (Cough). 8 g 2  . Methylcellulose, Laxative, (FIBER THERAPY PO) Take 3 tablets by mouth daily.    . metoprolol succinate (TOPROL XL) 25 MG 24 hr tablet Take 0.5 tablets (12.5 mg total) by mouth daily. 90 tablet 3  . MULTIPLE VITAMIN PO Take by mouth daily.    Marland Kitchen omeprazole (PRILOSEC) 40 MG capsule Take 1 capsule (40 mg total) by mouth 2 (two) times a day. (Patient taking differently: Take 40 mg by mouth daily. ) 180 capsule 3  . Probiotic Product (PROBIOTIC PO) Take 1 tablet by mouth daily.    . rizatriptan (MAXALT) 10 MG tablet Take 10 mg by mouth daily as needed.    . topiramate (TOPAMAX) 100 MG tablet Take 100 mg by mouth 2 (two) times daily.     No current facility-administered medications for this encounter.    Allergies  Allergen Reactions  . Aspirin Other (See Comments)    GI bleed  . Baclofen Diarrhea  . Codeine Nausea And Vomiting  . Imitrex  [Sumatriptan]     Palpatations  . Influenza Vaccines Swelling  . Vicodin  [Hydrocodone-Acetaminophen] Nausea And Vomiting    Social History   Socioeconomic History  . Marital status: Married    Spouse name: Not on file  . Number of children: Not on file  . Years of education: Not on file  . Highest education level: Not on file  Occupational History  . Not on file  Tobacco Use  . Smoking status: Former Smoker    Packs/day: 0.50    Years: 20.00     Pack years: 10.00    Types: Cigarettes    Quit date: 12/15/1978    Years since quitting: 41.0  . Smokeless tobacco: Never Used  Substance and Sexual Activity  . Alcohol use: No    Alcohol/week: 0.0 standard drinks    Comment: Rare  . Drug use: No  . Sexual activity: Never  Other Topics Concern  . Not on file  Social History Narrative   Lives with husband    Occupation: retired, was Art therapist  Edu: some college   Activity: stationary bicycle   Diet: good water, following slim fast diet, good vegetables   Social Determinants of Health   Financial Resource Strain:   . Difficulty of Paying Living Expenses: Not on file  Food Insecurity:   . Worried About Charity fundraiser in the Last Year: Not on file  . Ran Out of Food in the Last Year: Not on file  Transportation Needs:   . Lack of Transportation (Medical): Not on file  . Lack of Transportation (Non-Medical): Not on file  Physical Activity:   . Days of Exercise per Week: Not on file  . Minutes of Exercise per Session: Not on file  Stress:   . Feeling of Stress : Not on file  Social Connections:   . Frequency of Communication with Friends and Family: Not on file  . Frequency of Social Gatherings with Friends and Family: Not on file  . Attends Religious Services: Not on file  . Active Member of Clubs or Organizations: Not on file  . Attends Archivist Meetings: Not on file  . Marital Status: Not on file  Intimate Partner Violence:   . Fear of Current or Ex-Partner: Not on file  . Emotionally Abused: Not on file  . Physically Abused: Not on file  . Sexually Abused: Not on file    Family History  Problem Relation Age of Onset  . Stroke Mother 60  . Cancer Mother        Cervical  . Depression Mother   . Dementia Mother        vascular  . Heart disease Father        CABG  . Alcohol abuse Father   . Osteoporosis Father   . Heart attack Father   . Hypertension Brother   . Heart attack Brother    . Stroke Brother   . Stroke Brother 40  . Heart attack Brother   . Stroke Paternal Grandfather   . Alzheimer's disease Maternal Grandfather   . Breast cancer Neg Hx     ROS- All systems are reviewed and negative except as per the HPI above  Physical Exam: Vitals:   12/21/19 1023  BP: (!) 170/74  Pulse: 61  Weight: 73.3 kg  Height: 5\' 4"  (1.626 m)   Wt Readings from Last 3 Encounters:  12/21/19 73.3 kg  11/23/19 72.5 kg  11/17/19 71.8 kg    Labs: Lab Results  Component Value Date   NA 140 12/21/2019   K 4.2 12/21/2019   CL 112 (H) 12/21/2019   CO2 22 12/21/2019   GLUCOSE 94 12/21/2019   BUN 16 12/21/2019   CREATININE 0.81 12/21/2019   CALCIUM 8.9 12/21/2019   MG 2.2 07/21/2019   Lab Results  Component Value Date   INR 1.1 11/11/2019   Lab Results  Component Value Date   CHOL 159 07/21/2019   HDL 42.80 07/21/2019   LDLCALC 85 07/21/2019   TRIG 157.0 (H) 07/21/2019     GEN- The patient is well appearing, alert and oriented x 3 today.   Head- normocephalic, atraumatic Eyes-  Sclera clear, conjunctiva pink Ears- hearing intact Oropharynx- clear Neck- supple, no JVP Lymph- no cervical lymphadenopathy Lungs- Clear to ausculation bilaterally, normal work of breathing Heart- Regular rate and rhythm, no murmurs, rubs or gallops, PMI not laterally displaced GI- soft, NT, ND, + BS Extremities- no clubbing, cyanosis, or edema MS- no significant deformity or atrophy Skin- no rash or lesion  Psych- euthymic mood, full affect Neuro- strength and sensation are intact  EKG-NSR at 61 bpm, pr int 198 ms, qrs int 80 ms, qtc 410 ms( stable) Epic records reviewed    Assessment and Plan: 1.Paroxosymal  afib Doing well on Multaq 400 mg bid to control afib.  Continue current  BB dose as HR is in the 60's at baseline cmet today  2. CHA2DS2VASc score of 3 Continue eliquis 5 mg bid  Bleeding precautions  discussed   3. HTN Elevated today Pt states usually  controlled   F/u with cardiologist in 6 months Here in one year  Butch Penny C. Tiyah Zelenak, [facility] [address] [place], [place] [address] [phone]

## 2020-01-19 ENCOUNTER — Ambulatory Visit (INDEPENDENT_AMBULATORY_CARE_PROVIDER_SITE_OTHER): Payer: Medicare Other | Admitting: Gastroenterology

## 2020-01-19 ENCOUNTER — Other Ambulatory Visit: Payer: Self-pay

## 2020-01-19 ENCOUNTER — Encounter: Payer: Self-pay | Admitting: Gastroenterology

## 2020-01-19 VITALS — BP 129/65 | HR 71 | Temp 97.8°F | Ht 64.0 in | Wt 158.6 lb

## 2020-01-19 DIAGNOSIS — R05 Cough: Secondary | ICD-10-CM | POA: Diagnosis not present

## 2020-01-19 DIAGNOSIS — R059 Cough, unspecified: Secondary | ICD-10-CM

## 2020-01-19 DIAGNOSIS — K219 Gastro-esophageal reflux disease without esophagitis: Secondary | ICD-10-CM | POA: Diagnosis not present

## 2020-01-19 MED ORDER — PANTOPRAZOLE SODIUM 40 MG PO TBEC: 40.0000 mg | DELAYED_RELEASE_TABLET | Freq: Every day | ORAL | 3 refills | Status: DC

## 2020-01-19 NOTE — Progress Notes (Signed)
Primary Care Physician: Ria Bush, MD  Primary Gastroenterologist:  Dr. Lucilla Lame  Chief Complaint  Patient presents with  . coughing    HPI: Alexandria Moreno is a 79 y.o. female here with history of having a colonoscopy and upper endoscopy by me.  The patient had dysphagia in the past and had dilation of her esophagus.  The patient was also found to have a polyp that was an adenoma at that time.  The upper and lower were done in 2018. The patient now reports that she is here because of a persistent cough.  The patient has been on omeprazole twice a day and has had a cough.  She states that she has had a work-up by pulmonology and was recommended to come see me because nothing was found to be the cause in her lungs.  The patient does report that her symptoms are worse in the morning when she wakes up after being supine all night.  She does report that when she does cough up any fluid it is clear.   Current Outpatient Medications  Medication Sig Dispense Refill  . apixaban (ELIQUIS) 5 MG TABS tablet Take 1 tablet (5 mg total) by mouth 2 (two) times daily. 60 tablet 6  . atorvastatin (LIPITOR) 40 MG tablet TAKE 1 TABLET DAILY 90 tablet 3  . B COMPLEX VITAMINS PO Take by mouth daily.    . Calcium Citrate (CITRACAL PO) Take 1 tablet by mouth at bedtime.    . chlorhexidine (PERIDEX) 0.12 % solution as needed.   0  . cycloSPORINE (RESTASIS) 0.05 % ophthalmic emulsion Apply 0.05 drops to eye 2 (two) times daily.    . diphenoxylate-atropine (LOMOTIL) 2.5-0.025 MG tablet Take 1 tablet by mouth 4 (four) times daily. (Patient taking differently: Take 1 tablet by mouth as needed. ) 360 tablet 3  . dronedarone (MULTAQ) 400 MG tablet Take 1 tablet (400 mg total) by mouth 2 (two) times daily with a meal. 180 tablet 3  . glucosamine-chondroitin 500-400 MG tablet Take 1 tablet by mouth 2 (two) times daily.    . Ipratropium-Albuterol (COMBIVENT RESPIMAT) 20-100 MCG/ACT AERS respimat Inhale 1 puff  into the lungs every 6 (six) hours as needed for shortness of breath (Cough). 8 g 2  . Methylcellulose, Laxative, (FIBER THERAPY PO) Take 3 tablets by mouth daily.    . metoprolol succinate (TOPROL XL) 25 MG 24 hr tablet Take 0.5 tablets (12.5 mg total) by mouth daily. 90 tablet 3  . MULTIPLE VITAMIN PO Take by mouth daily.    Marland Kitchen omeprazole (PRILOSEC) 40 MG capsule Take 1 capsule (40 mg total) by mouth 2 (two) times a day. (Patient taking differently: Take 40 mg by mouth daily. ) 180 capsule 3  . pantoprazole (PROTONIX) 40 MG tablet Take 1 tablet (40 mg total) by mouth daily. 90 tablet 3  . Probiotic Product (PROBIOTIC PO) Take 1 tablet by mouth daily.    . rizatriptan (MAXALT) 10 MG tablet Take 10 mg by mouth daily as needed.    . topiramate (TOPAMAX) 100 MG tablet Take 100 mg by mouth 2 (two) times daily.     No current facility-administered medications for this visit.    Allergies as of 01/19/2020 - Review Complete 01/19/2020  Allergen Reaction Noted  . Aspirin Other (See Comments) 03/19/2015  . Baclofen Diarrhea 06/27/2016  . Codeine Nausea And Vomiting 03/19/2015  . Imitrex  [sumatriptan]  03/19/2015  . Influenza vaccines Swelling 01/08/2016  . Vicodin  [hydrocodone-acetaminophen]  Nausea And Vomiting 03/19/2015    ROS:  General: Negative for anorexia, weight loss, fever, chills, fatigue, weakness. ENT: Negative for hoarseness, difficulty swallowing , nasal congestion. CV: Negative for chest pain, angina, palpitations, dyspnea on exertion, peripheral edema.  Respiratory: Negative for dyspnea at rest, dyspnea on exertion, cough, sputum, wheezing.  GI: See history of present illness. GU:  Negative for dysuria, hematuria, urinary incontinence, urinary frequency, nocturnal urination.  Endo: Negative for unusual weight change.    Physical Examination:   BP 129/65   Pulse 71   Temp 97.8 F (36.6 C) (Oral)   Ht 5\' 4"  (1.626 m)   Wt 158 lb 9.6 oz (71.9 kg)   BMI 27.22 kg/m    General: Well-nourished, well-developed in no acute distress.  Eyes: No icterus. Conjunctivae pink. Lungs: Clear to auscultation bilaterally. Non-labored. Heart: Regular rate and rhythm, no murmurs rubs or gallops.  Abdomen: Bowel sounds are normal, nontender, nondistended, no hepatosplenomegaly or masses, no abdominal bruits or hernia , no rebound or guarding.   Extremities: No lower extremity edema. No clubbing or deformities. Neuro: Alert and oriented x 3.  Grossly intact. Skin: Warm and dry, no jaundice.   Psych: Alert and cooperative, normal mood and affect.  Labs:    Imaging Studies: No results found.  Assessment and Plan:   Alexandria Moreno is a 79 y.o. y/o female who comes in today with a persistent cough.  The patient has been on omeprazole for some time and is wondering if the cough could be due to reflux.  The patient has been told to elevate the head of her bed and she will be switched from omeprazole to twice a day to Protonix 40 mg once a day in the evening.  The patient has been told that if symptoms do not improve she should call the and she may need impedance studies to see if she is actually having reflux causing her cough.  The patient has been explained the plan and agrees with it.     Lucilla Lame, MD. [name]    Note: This dictation was prepared with Dragon dictation along with smaller phrase technology. Any transcriptional errors that result from this process are unintentional.

## 2020-02-06 ENCOUNTER — Encounter: Payer: Self-pay | Admitting: Family Medicine

## 2020-02-09 ENCOUNTER — Ambulatory Visit: Payer: Medicare Other | Attending: Internal Medicine

## 2020-02-09 DIAGNOSIS — Z23 Encounter for immunization: Secondary | ICD-10-CM | POA: Insufficient documentation

## 2020-02-09 NOTE — Progress Notes (Signed)
   Covid-19 Vaccination Clinic  Name:  Saphyra Hutt    MRN: [id number] DOB: 1941/08/29  02/09/2020  Ms. Zetino was observed post Covid-19 immunization for 30 minutes based on pre-vaccination screening without incidence. She was provided with Vaccine Information Sheet and instruction to access the V-Safe system.   Ms. Reen was instructed to call 911 with any severe reactions post vaccine: Marland Kitchen Difficulty breathing  . Swelling of your face and throat  . A fast heartbeat  . A bad rash all over your body  . Dizziness and weakness    Immunizations Administered    Name Date Dose VIS Date Route   Pfizer COVID-19 Vaccine 02/09/2020 10:54 AM 0.3 mL 11/25/2019 Intramuscular   Manufacturer: [place]   Lot: [id number]   [place]: [id number]

## 2020-03-06 ENCOUNTER — Ambulatory Visit (INDEPENDENT_AMBULATORY_CARE_PROVIDER_SITE_OTHER): Payer: Medicare Other | Admitting: Pulmonary Disease

## 2020-03-06 ENCOUNTER — Other Ambulatory Visit: Payer: Self-pay

## 2020-03-06 ENCOUNTER — Encounter: Payer: Self-pay | Admitting: Pulmonary Disease

## 2020-03-06 ENCOUNTER — Other Ambulatory Visit
Admission: RE | Admit: 2020-03-06 | Discharge: 2020-03-06 | Disposition: A | Discharge status: Home / Self Care | Payer: Medicare Other | Source: Ambulatory Visit | Attending: Pulmonary Disease | Admitting: Pulmonary Disease

## 2020-03-06 ENCOUNTER — Ambulatory Visit: Payer: Medicare Other | Admitting: Gastroenterology

## 2020-03-06 VITALS — BP 116/70 | HR 70 | Temp 97.8°F | Ht 64.0 in | Wt 161.8 lb

## 2020-03-06 DIAGNOSIS — J3089 Other allergic rhinitis: Secondary | ICD-10-CM | POA: Diagnosis not present

## 2020-03-06 DIAGNOSIS — I480 Paroxysmal atrial fibrillation: Secondary | ICD-10-CM

## 2020-03-06 DIAGNOSIS — K219 Gastro-esophageal reflux disease without esophagitis: Secondary | ICD-10-CM

## 2020-03-06 DIAGNOSIS — R0602 Shortness of breath: Secondary | ICD-10-CM

## 2020-03-06 DIAGNOSIS — R059 Cough, unspecified: Secondary | ICD-10-CM

## 2020-03-06 DIAGNOSIS — R05 Cough: Secondary | ICD-10-CM

## 2020-03-06 DIAGNOSIS — J302 Other seasonal allergic rhinitis: Secondary | ICD-10-CM | POA: Diagnosis not present

## 2020-03-06 MED ORDER — MONTELUKAST SODIUM 10 MG PO TABS: 10.0000 mg | ORAL_TABLET | Freq: Every day | ORAL | 2 refills | Status: DC

## 2020-03-06 NOTE — Patient Instructions (Signed)
We are going to give you a trial of Stiolto, 2 puffs daily,this REPLACES Combivent.  Call us if this is effective so we can send in a prescription to your pharmacy.  We are also giving you you a trial of Singulair 1 tablet daily.  We are doing a blood test to check for allergies.  We will see you in follow-up in 4 to 6 weeks time.  Call sooner should any new difficulties arise.  Will consider referral to [facility] voice disorder center if the issue with cough continues.

## 2020-03-06 NOTE — Progress Notes (Signed)
Subjective:    Patient ID: Alexandria Moreno, female    DOB: [birth year], 79 y.o.   MRN: [id number]  HPI Patient is a 79 year old remote former smoker who follows here for the issue of dyspnea and cough.  She was last seen in December 2020.  Prior to that she has had pulmonary function testing which was entirely normal and the chest x-ray on 3 December which was normal.  She was given a trial of Arnuity Ellipta and Combivent as needed during her last visit she feels that the Combivent is what helps her the most however does not last.  She was reevaluated by gastroenterology and placed on Protonix however this has caused worsening issues with her gastroesophageal reflux.  She notes that her cough is worse particularly after meals, it is nonproductive.  She notes that at times she will get into coughing spells that can come without warning.  She also has noted increased sneezing and "allergies".  No chest pain, orthopnea, paroxysmal nocturnal dyspnea, lower extremity edema, calf tenderness, no fevers, chills or sweats.  No hemoptysis.  Review of Systems A 10 point review of systems was performed and it is as noted above otherwise negative.  Allergies  Allergen Reactions  . Aspirin Other (See Comments)    GI bleed  . Baclofen Diarrhea  . Codeine Nausea And Vomiting  . Imitrex  [Sumatriptan]     Palpatations  . Influenza Vaccines Swelling  . Vicodin  [Hydrocodone-Acetaminophen] Nausea And Vomiting   She has had the first part of her COVID-19 vaccine.     Objective:   Physical Exam BP 116/70 (BP Location: Left Arm, Cuff Size: Normal)   Pulse 70   Temp 97.8 F (36.6 C) (Temporal)   Ht 5\' 4"  (1.626 m)   Wt 161 lb 12.8 oz (73.4 kg)   SpO2 98%   BMI 27.77 kg/m   GENERAL:Awake, well-developed well-nourished woman in no acute distress. Fully ambulatory.  Speech has a nasal quality today. HEAD: Normocephalic, atraumatic.  EYES: Pupils equal, round, reactive to light. No scleral icterus.    MOUTH:Nose/mouth/throat not examined due to masking requirements for COVID 19.  NECK: Supple. No thyromegaly. Trachea midline. No JVD. No adenopathy. PULMONARY: Lungs clear to auscultation bilaterally. CARDIOVASCULAR: S1 and S2. Regular rate and rhythm.No rubs, murmurs or gallops heard. GASTROINTESTINAL:Benign. MUSCULOSKELETAL: No joint deformity, no clubbing, no edema.  NEUROLOGIC:Awake, alert, fully oriented, no overt focal deficits. Speech is fluent. SKIN: Intact,warm,dry.  No rashes. PSYCH:Mood and behavior appropriate  Assessment from 11/17/2019 was normal, independently reviewed, image below:     Assessment & Plan:     ICD-10-CM   1. SOB (shortness of breath)  R06.02 Resp Allergy Profile Regn2DC DE MD [facility]   This could be due to to deconditioning PFTs were normal May also be related to palpitations  2. Cough  R05    Combivent helps the most but has to repeat frequently during the day Trial of Stiolto which in essence is long-acting Combivent Continue Arnuity  3. Gastroesophageal reflux disease, unspecified whether esophagitis present  K21.9    Suspect this is aggravating her cough, more symptomatic on Protonix Patient was encouraged to discuss with her GI physician  4. Perennial allergic rhinitis with seasonal variation  J30.89    J30.2    Trial of Singulair  5. Paroxysmal atrial fibrillation (HCC)  I48.0    This issue adds complexity to her management   Orders Placed This Encounter  Procedures  . Resp Allergy Profile Regn2DC  DE MD [facility]    Standing Status:   Future    Number of Occurrences:   1    Standing Expiration Date:   03/06/2021    Discussion:  The patient's dyspnea may be related to issues with deconditioning and aggravated by her palpitations.  PFTs have been normal.  Chest x-ray normal.  She has issues with cough likely driven by GERD/LPR.  She has had a setback due to change of GERD medication to Protonix which actually has worsened her symptoms.   I have encouraged her to discuss this with Dr. Allen Norris.  She has issues with with a flareup of rhinitis due to change in season.  We will give her a short course of Singulair.  She notes that Combivent does help her cough somewhat so will give her a trial of Stiolto to see if she can maintain better control during the day this is in essence long-acting Combivent.  She is not to use Combivent while she is using Stiolto.  Stiolto was given as a sample only.  We will obtain a RAST panel to evaluate for potential allergy driven issues.  See her in follow-up in 4 to 6 weeks time.  If her issues with cough continue on follow-up visit will consider referral to [facility] voice disorder center.   Renold Don, MD [facility]   *This note was dictated using voice recognition software/Dragon.  Despite best efforts to proofread, errors can occur which can change the meaning.  Any change was purely unintentional.

## 2020-03-06 NOTE — Progress Notes (Deleted)
   Subjective:    Patient ID: Alexandria Moreno, female    DOB: [birth year], 79 y.o.   MRN: [id number]  HPI    Review of Systems     Objective:   Physical Exam        Assessment & Plan:

## 2020-03-07 ENCOUNTER — Ambulatory Visit: Payer: Medicare Other | Attending: Internal Medicine

## 2020-03-07 ENCOUNTER — Other Ambulatory Visit: Payer: Self-pay

## 2020-03-07 DIAGNOSIS — Z23 Encounter for immunization: Secondary | ICD-10-CM

## 2020-03-07 MED ORDER — APIXABAN 5 MG PO TABS: 5.0000 mg | ORAL_TABLET | Freq: Two times a day (BID) | ORAL | 6 refills | Status: DC

## 2020-03-07 NOTE — Progress Notes (Signed)
   Covid-19 Vaccination Clinic  Name:  Alexandria Moreno    MRN: [id number] DOB: Mar 27, 1941  03/07/2020  Alexandria Moreno was observed post Covid-19 immunization for 15 minutes without incident. She was provided with Vaccine Information Sheet and instruction to access the V-Safe system.   Alexandria Moreno was instructed to call 911 with any severe reactions post vaccine: Marland Kitchen Difficulty breathing  . Swelling of face and throat  . A fast heartbeat  . A bad rash all over body  . Dizziness and weakness   Immunizations Administered    Name Date Dose VIS Date Route   Pfizer COVID-19 Vaccine 03/07/2020 11:58 AM 0.3 mL 11/25/2019 Intramuscular   Manufacturer: [place]   Lot: [id number]   [place]: [id number]

## 2020-03-09 LAB — MISC LABCORP TEST (SEND OUT): Labcorp test code: 607111

## 2020-03-13 ENCOUNTER — Telehealth: Payer: Self-pay | Admitting: Pulmonary Disease

## 2020-03-13 MED ORDER — STIOLTO RESPIMAT 2.5-2.5 MCG/ACT IN AERS: 2.0000 | INHALATION_SPRAY | Freq: Every day | RESPIRATORY_TRACT | 1 refills | Status: DC

## 2020-03-13 NOTE — Telephone Encounter (Signed)
DG gave pt a trial of Stiolto to try. Pt called today to give feedback on the Stiolto, and she said she prefers the Combivent inhaler because she can use it as needed versus the Stiolto she can only use once a day. Her coughing is about the same. She was given two samples but she has only used one as of right now. She didn't know if DG wanted her to finish the Stiolto first. She would like a call back at [phone]

## 2020-03-13 NOTE — Telephone Encounter (Signed)
Called spoke with patient.  Aaron Edelman NP states that patient may take the combivent if needed up to 2 times a day with her stiolto.   Let patient know this. Instructed also if she is consistently taking combivent two times a day , then she needs to call our office and let us know.   [facility] patient likes the way stiolto is working for her.   Patient verbalized understanding, Nothing further needed at this time.

## 2020-03-15 ENCOUNTER — Telehealth: Payer: Self-pay | Admitting: Gastroenterology

## 2020-03-15 NOTE — Telephone Encounter (Signed)
Patient called & l/m stating Dr Allen Norris had started her on Pantoprazole 40mg  & they where not working so she has gone back to taking omeprazole (PRILOSEC) 40 MG capsule.

## 2020-03-16 ENCOUNTER — Other Ambulatory Visit: Payer: Self-pay

## 2020-03-16 NOTE — Telephone Encounter (Signed)
Pt requesting Lomotil and Omeprazole 40mg  BID [facility]

## 2020-03-19 ENCOUNTER — Other Ambulatory Visit: Payer: Self-pay

## 2020-03-19 MED ORDER — OMEPRAZOLE 40 MG PO CPDR: 40.0000 mg | DELAYED_RELEASE_CAPSULE | Freq: Two times a day (BID) | ORAL | 3 refills | Status: DC

## 2020-03-19 MED ORDER — DIPHENOXYLATE-ATROPINE 2.5-0.025 MG PO TABS: 1.0000 | ORAL_TABLET | Freq: Four times a day (QID) | ORAL | 3 refills | Status: DC

## 2020-04-10 ENCOUNTER — Other Ambulatory Visit: Payer: Self-pay

## 2020-04-10 MED ORDER — APIXABAN 5 MG PO TABS: 5.0000 mg | ORAL_TABLET | Freq: Two times a day (BID) | ORAL | 1 refills | Status: DC

## 2020-04-10 NOTE — Telephone Encounter (Signed)
Please review for refill on Eliquis 5 mg

## 2020-04-10 NOTE — Telephone Encounter (Signed)
Prescription refill request for Eliquis received.  Last office visit: Alexandria Moreno 12/21/2019 Scr: 0.81, 12/21/2019 Age: 79 y.o. Weight: 73.4 kg   Prescription refill sent.

## 2020-04-12 ENCOUNTER — Ambulatory Visit: Payer: Medicare Other | Admitting: Pulmonary Disease

## 2020-05-01 ENCOUNTER — Encounter: Payer: Self-pay | Admitting: Dermatology

## 2020-05-01 ENCOUNTER — Other Ambulatory Visit: Payer: Self-pay

## 2020-05-01 ENCOUNTER — Ambulatory Visit (INDEPENDENT_AMBULATORY_CARE_PROVIDER_SITE_OTHER): Payer: Medicare Other | Admitting: Dermatology

## 2020-05-01 DIAGNOSIS — Z1283 Encounter for screening for malignant neoplasm of skin: Secondary | ICD-10-CM

## 2020-05-01 DIAGNOSIS — C4441 Basal cell carcinoma of skin of scalp and neck: Secondary | ICD-10-CM | POA: Diagnosis not present

## 2020-05-01 DIAGNOSIS — D229 Melanocytic nevi, unspecified: Secondary | ICD-10-CM

## 2020-05-01 DIAGNOSIS — I8393 Asymptomatic varicose veins of bilateral lower extremities: Secondary | ICD-10-CM

## 2020-05-01 DIAGNOSIS — L814 Other melanin hyperpigmentation: Secondary | ICD-10-CM

## 2020-05-01 DIAGNOSIS — L821 Other seborrheic keratosis: Secondary | ICD-10-CM | POA: Diagnosis not present

## 2020-05-01 DIAGNOSIS — L570 Actinic keratosis: Secondary | ICD-10-CM | POA: Diagnosis not present

## 2020-05-01 DIAGNOSIS — D1801 Hemangioma of skin and subcutaneous tissue: Secondary | ICD-10-CM | POA: Diagnosis not present

## 2020-05-01 DIAGNOSIS — L578 Other skin changes due to chronic exposure to nonionizing radiation: Secondary | ICD-10-CM | POA: Diagnosis not present

## 2020-05-01 DIAGNOSIS — Z86007 Personal history of in-situ neoplasm of skin: Secondary | ICD-10-CM

## 2020-05-01 DIAGNOSIS — L820 Inflamed seborrheic keratosis: Secondary | ICD-10-CM | POA: Diagnosis not present

## 2020-05-01 DIAGNOSIS — Z85828 Personal history of other malignant neoplasm of skin: Secondary | ICD-10-CM | POA: Diagnosis not present

## 2020-05-01 DIAGNOSIS — D485 Neoplasm of uncertain behavior of skin: Secondary | ICD-10-CM

## 2020-05-01 NOTE — Progress Notes (Signed)
Follow-Up Visit   Subjective  Alexandria Moreno is a 79 y.o. female who presents for the following: Annual Exam (TBSE). Patient used 5FU cream BID x 2 weeks on left ant alar crease, area scabbed up, improved now.  No more scaling, not tender.  Seems clear.  She has some itchy spots on chest, and arm.  Also ISK on R cheek still isn't clear.  The following portions of the chart were reviewed this encounter and updated as appropriate:      Review of Systems:  No other skin or systemic complaints except as noted in HPI or Assessment and Plan.  Objective  Well appearing patient in no apparent distress; mood and affect are within normal limits.  A full examination was performed including scalp, head, eyes, ears, nose, lips, neck, chest, axillae, abdomen, back, buttocks, bilateral upper extremities, bilateral lower extremities, hands, feet, fingers, toes, fingernails, and toenails. All findings within normal limits unless otherwise noted below.  Objective  Left hand dorsum below thumb x 1: Pink scaly papule on left hand dorsum below thumb. L ant alar crease - clear  Objective  Right lateral cheek x 2 (residual), chest x 5, R forearm x 1 (8): Waxy flat pink papules x 2 on right lateral cheek.  keratotic waxy stuck-on papules chest, R arm  Objective  Left lower neck: 9.0 x 4.62mm pink pearly patch      Objective  Left Neck Anterior to scar: 5.59mm pink scaly macule       Assessment & Plan   Skin cancer screening performed today. History of Basal Cell Carcinoma of the Skin - No evidence of recurrence today - Recommend regular full body skin exams - Recommend daily broad spectrum sunscreen SPF 30+ to sun-exposed areas, reapply every 2 hours as needed.  - Call if any new or changing lesions are noted between office visits .  History of Squamous Cell Carcinoma in Situ of the Skin - No evidence of recurrence today - Recommend regular full body skin exams - Recommend daily  broad spectrum sunscreen SPF 30+ to sun-exposed areas, reapply every 2 hours as needed.  - Call if any new or changing lesions are noted between office visits  Actinic Damage - diffuse scaly erythematous macules with underlying dyspigmentation - Recommend daily broad spectrum sunscreen SPF 30+ to sun-exposed areas, reapply every 2 hours as needed.  - Call for new or changing lesions.  Lentigines - Scattered tan macules - Discussed due to sun exposure - Benign, observe - Call for any changes  Seborrheic Keratoses - Stuck-on, waxy, tan-brown papules and plaques  - Discussed benign etiology and prognosis. - Observe - Call for any changes  Melanocytic Nevi - Tan-brown and/or pink-flesh-colored symmetric macules and papules - Benign appearing on exam today - Observation - Call clinic for new or changing moles - Recommend daily use of broad spectrum spf 30+ sunscreen to sun-exposed areas.   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  Destruction of lesion - Left hand dorsum  below thumb x 1  Destruction method: cryotherapy   Informed consent: discussed and consent obtained   Lesion destroyed using liquid nitrogen: Yes   Region frozen until ice ball extended beyond lesion: Yes   Outcome: patient tolerated procedure well with no complications   Post-procedure details: wound care instructions given    Inflamed seborrheic keratosis (8) Right lateral cheek x 2 (residual), chest x 5, R forearm x 1  Destruction of lesion - Right lateral  cheek x 2 (residual), chest x 5, R forearm x 1  Destruction method: cryotherapy   Informed consent: discussed and consent obtained   Lesion destroyed using liquid nitrogen: Yes   Region frozen until ice ball extended beyond lesion: Yes   Outcome: patient tolerated procedure well with no complications   Post-procedure details: wound care instructions given    Neoplasm of uncertain behavior of skin (2) Left lower neck  Skin / nail biopsy Type of biopsy: tangential   Informed consent: discussed and consent obtained   Patient was prepped and draped in usual sterile fashion: Area prepped with alcohol. Anesthesia: the lesion was anesthetized in a standard fashion   Anesthetic:  1% lidocaine w/ epinephrine 1-100,000 buffered w/ 8.4% NaHCO3 Instrument used: flexible razor blade   Hemostasis achieved with: pressure and aluminum chloride   Post-procedure details comment:  Ointment and small bandage applied  Destruction of lesion  Destruction method: electrodesiccation and curettage   Informed consent: discussed and consent obtained   Timeout:  patient name, date of birth, surgical site, and procedure verified Curettage performed in three different directions: Yes   Electrodesiccation performed over the curetted area: Yes   Lesion length (cm):  0.9 Lesion width (cm):  0.4 Margin per side (cm):  0.1 Final wound size (cm):  1.1 Hemostasis achieved with:  pressure, aluminum chloride and electrodesiccation Outcome: patient tolerated procedure well with no complications   Post-procedure details: wound care instructions given   Additional details:  Mupirocin ointment and Bandaid applied    Specimen 1 - Surgical pathology Differential Diagnosis: BCC vs other Check Margins: No 9.0 x 4.65mm pink pearly patch EDC today  Left Neck Anterior to scar  Skin / nail biopsy Type of biopsy: tangential   Informed consent: discussed and consent obtained   Patient was prepped and draped in usual  sterile fashion: Area prepped with alcohol. Anesthesia: the lesion was anesthetized in a standard fashion   Anesthetic:  1% lidocaine w/ epinephrine 1-100,000 buffered w/ 8.4% NaHCO3 Instrument used: flexible razor blade   Hemostasis achieved with: pressure and aluminum chloride    Destruction of lesion  Destruction method: electrodesiccation and curettage   Informed consent: discussed and consent obtained   Timeout:  patient name, date of birth, surgical site, and procedure verified Curettage performed in three different directions: Yes   Electrodesiccation performed over the curetted area: Yes   Lesion length (cm):  0.5 Lesion width (cm):  0.5 Margin per side (cm):  0.1 Final wound size (cm):  0.7 Hemostasis achieved with:  pressure, aluminum chloride and electrodesiccation Outcome: patient tolerated procedure well with no complications   Post-procedure details: wound care instructions given   Additional details:  Mupirocin ointment and Bandaid applied    Specimen 2 - Surgical pathology Differential Diagnosis: BCC vs other Check Margins: No 5.25mm pink scaly patch EDC today  If L neck ant to scar recurs, will excise.  Return in about 6 months (around 11/01/2020) for f/u BCCs.   Lindi Adie, CMA, am acting as  scribe for Brendolyn Patty, MD .  Documentation: I have reviewed the above documentation for accuracy and completeness, and I agree with the above.  Brendolyn Patty MD

## 2020-05-01 NOTE — Patient Instructions (Signed)
Wound Care Instructions ° °1. Cleanse wound gently with soap and water once a day then pat dry with clean gauze. Apply a thing coat of Petrolatum (petroleum jelly, "Vaseline") over the wound (unless you have an allergy to this). We recommend that you use a new, sterile tube of Vaseline. Do not pick or remove scabs. Do not remove the yellow or white "healing tissue" from the base of the wound. ° °2. Cover the wound with fresh, clean, nonstick gauze and secure with paper tape. You may use Band-Aids in place of gauze and tape if the would is small enough, but would recommend trimming much of the tape off as there is often too much. Sometimes Band-Aids can irritate the skin. ° °3. You should call the office for your biopsy report after 1 week if you have not already been contacted. ° °4. If you experience any problems, such as abnormal amounts of bleeding, swelling, significant bruising, significant pain, or evidence of infection, please call the office immediately. ° °Cryotherapy Aftercare ° °• Wash gently with soap and water everyday.   °• Apply Vaseline and Band-Aid daily until healed. ° ° °

## 2020-05-03 ENCOUNTER — Telehealth: Payer: Self-pay

## 2020-05-03 NOTE — Telephone Encounter (Signed)
Pt advised of bx results and both were txted at time of bx.  Advised to keep f/u appt 11/05/20 and Dr. Nicole Kindred would recheck./sh

## 2020-05-03 NOTE — Telephone Encounter (Signed)
-----   Message from Brendolyn Patty, MD sent at 05/03/2020  7:54 AM EDT ----- 1. Skin , left lower neck BASAL CELL CARCINOMA, NODULAR PATTERN 2. Skin , left neck anterior to scar BASAL CELL CARCINOMA, SUPERFICIAL AND NODULAR PATTERNS  Both have been treated with EDC at time of biopsy

## 2020-05-08 ENCOUNTER — Ambulatory Visit: Payer: Medicare Other | Admitting: Cardiology

## 2020-05-08 ENCOUNTER — Encounter: Payer: Self-pay | Admitting: Cardiology

## 2020-05-08 ENCOUNTER — Telehealth: Payer: Self-pay | Admitting: Cardiology

## 2020-05-08 ENCOUNTER — Other Ambulatory Visit: Payer: Self-pay

## 2020-05-08 ENCOUNTER — Ambulatory Visit (INDEPENDENT_AMBULATORY_CARE_PROVIDER_SITE_OTHER): Payer: Medicare Other | Admitting: Cardiology

## 2020-05-08 VITALS — BP 130/62 | HR 61 | Ht 64.0 in | Wt 157.0 lb

## 2020-05-08 DIAGNOSIS — I480 Paroxysmal atrial fibrillation: Secondary | ICD-10-CM

## 2020-05-08 DIAGNOSIS — E7800 Pure hypercholesterolemia, unspecified: Secondary | ICD-10-CM

## 2020-05-08 MED ORDER — APIXABAN 5 MG PO TABS: 5.0000 mg | ORAL_TABLET | Freq: Two times a day (BID) | ORAL | 3 refills | Status: DC

## 2020-05-08 NOTE — Progress Notes (Signed)
Cardiology Office Note:    Date:  05/08/2020   ID:  Alexandria Moreno, DOB 31-Mar-1941, MRN [id number]  PCP:  Ria Bush, MD  Cardiologist:  Kate Sable, MD  Electrophysiologist:  None   Referring MD: Ria Bush, MD   Chief Complaint  Patient presents with  . Other    6 month follow up. PAtient c.o some SOB with activity.  Meds reviewed verbally with patient.     History of Present Illness:    Alexandria Moreno is a 79 y.o. female with a hx of paroxysmal atrial fibrillation, hyperlipidemia who presents for follow-up.  Patient being seen for atrial fibrillation.  She establish care with the A. fib clinic where Multaq was started.  Prior echocardiogram showed normal systolic and diastolic function, Lexiscan Myoview with no evidence for ischemia.  She has been tolerating Multaq and Eliquis without any adverse effects.  Toprol was decreased to 12.5 mg daily.  Patient denies any symptoms of dizziness, presyncope or syncope.  She has gotten back to walking after restrictions being lifted for the pandemic.  She feels well, has no concerns at this time.    Past Medical History:  Diagnosis Date  . Basal cell carcinoma 05/22/2015   R nasal ala  . Basal cell carcinoma 07/21/2017   L upper arm, L neck, L medial shoulder  . Basal cell carcinoma 09/21/2018   R shoulder  . Diverticulosis   . DJD (degenerative joint disease) of knee   . GERD (gastroesophageal reflux disease)   . H/o Iron deficiency anemia 2009   a. 05/2008 EGD/Colonoscopy: nl w/o evidence of bleeding; b. 11/2017 EGD: nl. Colonoscopy: 45mm cecal polyp, otw nl.  . H/O transfusion of whole blood 2009  . H/O: rheumatic fever    79 years old  . History of chicken pox   . History of echocardiogram    a. 09/2019 Echo: EF 60-65%, nl RV size/fxn. Nl Atrial sizes. Triv TR. Mild AI.  Marland Kitchen Hx of basal cell carcinoma 2010   L dorsum tip of nose (MOHS), R ant alar groove (MOHS)  . Hx of squamous cell carcinoma 11/01/2018   SCC  is L upper forehead  . Hyperlipidemia   . Migraines   . PAF (paroxysmal atrial fibrillation) ([place])    a. 08/2019 Zio: Avg HR 60 (min 43, max 144). PAF (2% burden), longest 1h 44m (avg rate 77). Rare PAC's and PVC's.  . Palpitation   . Pelvic prolapse    with rectocele  . Rosacea   . Seasonal allergic rhinitis    mild  . 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 Stress incontinence     Past Surgical History:  Procedure Laterality Date  . ABDOMINAL HYSTERECTOMY  1981  . APPENDECTOMY  1960  . capsule endoscopy  2009   WNL  . CATARACT EXTRACTION Bilateral [id number]  . COLONOSCOPY  2009   diverticulosis ([place])  . COLONOSCOPY WITH PROPOFOL N/A 12/01/2017   TA, rpt 5 yrs ([place])  . DEXA  06/2016   T -0.2 hip, 0.7 spine  . ESOPHAGOGASTRODUODENOSCOPY  2009   focal mild chronic gastritis, neg H pylori ([place])  . ESOPHAGOGASTRODUODENOSCOPY (EGD) WITH PROPOFOL N/A 12/01/2017   Procedure: ESOPHAGOGASTRODUODENOSCOPY (EGD) WITH PROPOFOL;  Surgeon: Lucilla Lame, MD;  Location: [facility] ENDOSCOPY;  Service: Endoscopy;  Laterality: N/A;  . INCONTINENCE SURGERY  2001   with mesh  . LIPOMA EXCISION  1971   right shoulder  . MOHS SURGERY  [place]  . SALPINGOOPHORECTOMY Left 2001  . TONSILLECTOMY  1958    Current Medications: Current Meds  Medication Sig  . apixaban (ELIQUIS) 5 MG TABS tablet Take 1 tablet (5 mg total) by mouth 2 (two) times daily.  Marland Kitchen atorvastatin (LIPITOR) 40 MG tablet TAKE 1 TABLET DAILY  . B COMPLEX VITAMINS PO Take by mouth daily.  . Calcium Citrate (CITRACAL PO) Take 1 tablet by mouth at bedtime.  . chlorhexidine (PERIDEX) 0.12 % solution as needed.   . cycloSPORINE (RESTASIS) 0.05 % ophthalmic emulsion Apply 0.05 drops to eye 2 (two) times daily.  . diphenoxylate-atropine (LOMOTIL) 2.5-0.025 MG tablet Take 1 tablet by mouth 4 (four) times daily.  Marland Kitchen dronedarone (MULTAQ) 400 MG tablet Take 1 tablet (400 mg total)  by mouth 2 (two) times daily with a meal.  . glucosamine-chondroitin 500-400 MG tablet Take 1 tablet by mouth 2 (two) times daily.  . Methylcellulose, Laxative, (FIBER THERAPY PO) Take 3 tablets by mouth daily.  . metoprolol succinate (TOPROL XL) 25 MG 24 hr tablet Take 0.5 tablets (12.5 mg total) by mouth daily.  . MULTIPLE VITAMIN PO Take by mouth daily.  Marland Kitchen omeprazole (PRILOSEC) 40 MG capsule Take 1 capsule (40 mg total) by mouth 2 (two) times daily.  . Probiotic Product (PROBIOTIC PO) Take 1 tablet by mouth daily.  . rizatriptan (MAXALT) 10 MG tablet Take 10 mg by mouth daily as needed.  . topiramate (TOPAMAX) 100 MG tablet Take 100 mg by mouth 2 (two) times daily.  . [DISCONTINUED] apixaban (ELIQUIS) 5 MG TABS tablet Take 1 tablet (5 mg total) by mouth 2 (two) times daily.     Allergies:   Aspirin, Baclofen, Codeine, Imitrex  [sumatriptan], Influenza vaccines, and Vicodin  [hydrocodone-acetaminophen]   Social History   Socioeconomic History  . Marital status: Married    Spouse name: Not on file  . Number of children: Not on file  . Years of education: Not on file  . Highest education level: Not on file  Occupational History  . Not on file  Tobacco Use  . Smoking status: Former Smoker    Packs/day: 0.50    Years: 20.00    Pack years: 10.00    Types: Cigarettes    Quit date: 12/15/1978    Years since quitting: 41.4  . Smokeless tobacco: Never Used  Substance and Sexual Activity  . Alcohol use: No    Alcohol/week: 0.0 standard drinks    Comment: Rare  . Drug use: No  . Sexual activity: Never  Other Topics Concern  . Not on file  Social History Narrative   Lives with husband    Occupation: retired, was Art therapist   Edu: some college   Activity: stationary bicycle   Diet: good water, following slim fast diet, good vegetables   Social Determinants of Radio broadcast assistant Strain:   . Difficulty of Paying Living Expenses:   Food Insecurity:   . Worried  About Charity fundraiser in the Last Year:   . Arboriculturist in the Last Year:   Transportation Needs:   . Film/video editor (Medical):   Marland Kitchen Lack of Transportation (Non-Medical):   Physical Activity:   . Days of Exercise per Week:   . Minutes of Exercise per Session:   Stress:   . Feeling of Stress :   Social Connections:   . Frequency of Communication with Friends and Family:   .  Frequency of Social Gatherings with Friends and Family:   . Attends Religious Services:   . Active Member of Clubs or Organizations:   . Attends Archivist Meetings:   Marland Kitchen Marital Status:      Family History: The patient's family history includes Alcohol abuse in her father; Alzheimer's disease in her maternal grandfather; Cancer in her mother; Dementia in her mother; Depression in her mother; Heart attack in her brother, brother, and father; Heart disease in her father; Hypertension in her brother; Osteoporosis in her father; Stroke in her brother and paternal grandfather; Stroke (age of onset: 43) in her mother; Stroke (age of onset: 23) in her brother. There is no history of Breast cancer.  ROS:   Please see the history of present illness.     All other systems reviewed and are negative.  EKGs/Labs/Other Studies Reviewed:    The following studies were reviewed today:  Lexiscan myocardial perfusion imaging stress test date 11/03/2019 Pharmacological myocardial perfusion imaging study with no significant  ischemia Normal wall motion, EF estimated at 53% No EKG changes concerning for ischemia at peak stress or in recovery. CT attenuation corrected images with mild thoracic descending aorta atherosclerosis, no significant coronary calcification noted Resting blood pressure 160 over 70s, remained 440 systolic following the test Low risk scan TTE 09/21/2019  1. Left ventricular ejection fraction, by visual estimation, is 60 to 65%. The left ventricle has normal function. Normal left  ventricular size. There is no left ventricular hypertrophy.  2. Global right ventricle has normal systolic function.The right ventricular size is normal. Right vetricular wall thickness was not assessed.  3. Left atrial size was normal.  4. Right atrial size was normal.  5. The mitral valve is normal in structure. No evidence of mitral valve regurgitation.  6. The tricuspid valve is normal in structure. Tricuspid valve regurgitation is trivial.  7. The aortic valve is tricuspid Aortic valve regurgitation is mild by color flow Doppler.  8. The pulmonic valve was normal in structure. Pulmonic valve regurgitation is not visualized by color flow Doppler.   2-week cardiac monitor date 08/29/2019 Patient had a min HR of 43 bpm, max HR of 144 bpm, and avg HR of 60 bpm. Predominant underlying rhythm was Sinus Rhythm. First Degree AV Block was present. Atrial Fibrillation occurred (2% burden), ranging from 45-144 bpm (avg of 79 bpm), the longest lasting 1 hour 23 mins with an avg rate of 77 bpm. Atrial Fibrillation was detected within +/- 45 seconds of symptomatic patient event(s). Isolated SVEs were rare (<1.0%), SVE Couplets were rare (<1.0%), and SVE Triplets were rare (<1.0%). Isolated VEs were rare (<1.0%), VE Couplets were rare (<1.0%), and no VE Triplets were present.  EKG:  EKG is  ordered today.  The ekg ordered today demonstrates normal sinus rhythm, possible old septal infarct.   Recent Labs: 07/21/2019: Magnesium 2.2; TSH 3.50 11/11/2019: Hemoglobin 14.6; Platelets 149 12/21/2019: ALT 27; BUN 16; Creatinine, Ser 0.81; Potassium 4.2; Sodium 140  Recent Lipid Panel    Component Value Date/Time   CHOL 159 07/21/2019 0919   CHOL 144 11/19/2015 1119   TRIG 157.0 (H) 07/21/2019 0919   HDL 42.80 07/21/2019 0919   HDL 42 11/19/2015 1119   CHOLHDL 4 07/21/2019 0919   VLDL 31.4 07/21/2019 0919   LDLCALC 85 07/21/2019 0919   LDLCALC 71 11/19/2015 1119    Physical Exam:    VS:  BP  130/62 (BP Location: Left Arm, Patient Position: Sitting, Cuff Size: Normal)  Pulse 61   Ht 5\' 4"  (1.626 m)   Wt 157 lb (71.2 kg)   SpO2 98%   BMI 26.95 kg/m     Wt Readings from Last 3 Encounters:  05/08/20 157 lb (71.2 kg)  03/06/20 161 lb 12.8 oz (73.4 kg)  01/19/20 158 lb 9.6 oz (71.9 kg)     GEN:  Well nourished, well developed in no acute distress HEENT: Normal NECK: No JVD; No carotid bruits LYMPHATICS: No lymphadenopathy CARDIAC: RRR, no murmurs, rubs, gallops RESPIRATORY:  Clear to auscultation without rales, wheezing or rhonchi  ABDOMEN: Soft, non-tender, non-distended MUSCULOSKELETAL:  No edema; No deformity  SKIN: Warm and dry NEUROLOGIC:  Alert and oriented x 3 PSYCHIATRIC:  Normal affect   ASSESSMENT:    1. Paroxysmal atrial fibrillation (HCC)   2. Pure hypercholesterolemia    PLAN:    In order of problems listed above:  1. Patient with paroxysmal atrial fibrillation, currently in sinus rhythm.  CHA2DS2-VASc of 3, continue Multaq, Eliquis, Toprol 12.5 mg daily..  2.  History of hyperlipidemia, continue statin as prescribed.  Follow up in 6 months  Medication Adjustments/Labs and Tests Ordered: Current medicines are reviewed at length with the patient today.  Concerns regarding medicines are outlined above.  Orders Placed This Encounter  Procedures  . EKG 12-Lead   Meds ordered this encounter  Medications  . apixaban (ELIQUIS) 5 MG TABS tablet    Sig: Take 1 tablet (5 mg total) by mouth 2 (two) times daily.    Dispense:  180 tablet    Refill:  3    Patient Instructions  Medication Instructions:  No Changes *If you need a refill on your cardiac medications before your next appointment, please call your pharmacy*   Lab Work: None Ordered If you have labs (blood work) drawn today and your tests are completely normal, you will receive your results only by: Marland Kitchen MyChart Message (if you have MyChart) OR . A paper copy in the mail If you have any  lab test that is abnormal or we need to change your treatment, we will call you to review the results.   Testing/Procedures: None Ordered   Follow-Up: At [facility], you and your health needs are our priority.  As part of our continuing mission to provide you with exceptional heart care, we have created designated Provider Care Teams.  These Care Teams include your primary Cardiologist (physician) and Advanced Practice Providers (APPs -  Physician Assistants and Nurse Practitioners) who all work together to provide you with the care you need, when you need it.  We recommend signing up for the patient portal called "MyChart".  Sign up information is provided on this After Visit Summary.  MyChart is used to connect with patients for Virtual Visits (Telemedicine).  Patients are able to view lab/test results, encounter notes, upcoming appointments, etc.  Non-urgent messages can be sent to your provider as well.   To learn more about what you can do with MyChart, go to [email or web address].    Your next appointment:   6 month(s)  The format for your next appointment:   In Person  Provider:   Kate Sable, MD   Other Instructions N/A    Signed, Kate Sable, MD  05/08/2020 10:22 AM    [place]

## 2020-05-08 NOTE — Telephone Encounter (Signed)
Refill for Eliquis sent to [facility].  Patient has already notified [facility] to cancel previous refill. Patient appreciative.

## 2020-05-08 NOTE — Telephone Encounter (Signed)
Patient states her Eliquis should be called in to [facility] instead of [facility]

## 2020-05-08 NOTE — Patient Instructions (Signed)
Medication Instructions:  No Changes *If you need a refill on your cardiac medications before your next appointment, please call your pharmacy*   Lab Work: None Ordered If you have labs (blood work) drawn today and your tests are completely normal, you will receive your results only by: Marland Kitchen MyChart Message (if you have MyChart) OR . A paper copy in the mail If you have any lab test that is abnormal or we need to change your treatment, we will call you to review the results.   Testing/Procedures: None Ordered   Follow-Up: At [facility], you and your health needs are our priority.  As part of our continuing mission to provide you with exceptional heart care, we have created designated Provider Care Teams.  These Care Teams include your primary Cardiologist (physician) and Advanced Practice Providers (APPs -  Physician Assistants and Nurse Practitioners) who all work together to provide you with the care you need, when you need it.  We recommend signing up for the patient portal called "MyChart".  Sign up information is provided on this After Visit Summary.  MyChart is used to connect with patients for Virtual Visits (Telemedicine).  Patients are able to view lab/test results, encounter notes, upcoming appointments, etc.  Non-urgent messages can be sent to your provider as well.   To learn more about what you can do with MyChart, go to [email or web address].    Your next appointment:   6 month(s)  The format for your next appointment:   In Person  Provider:   Kate Sable, MD   Other Instructions N/A

## 2020-05-09 ENCOUNTER — Encounter: Payer: Self-pay | Admitting: Pulmonary Disease

## 2020-05-09 ENCOUNTER — Ambulatory Visit (INDEPENDENT_AMBULATORY_CARE_PROVIDER_SITE_OTHER): Payer: Medicare Other | Admitting: Pulmonary Disease

## 2020-05-09 VITALS — BP 118/70 | HR 63 | Temp 96.8°F | Ht 64.0 in | Wt 158.0 lb

## 2020-05-09 DIAGNOSIS — R059 Cough, unspecified: Secondary | ICD-10-CM

## 2020-05-09 DIAGNOSIS — R05 Cough: Secondary | ICD-10-CM | POA: Diagnosis not present

## 2020-05-09 DIAGNOSIS — K219 Gastro-esophageal reflux disease without esophagitis: Secondary | ICD-10-CM

## 2020-05-09 DIAGNOSIS — R0602 Shortness of breath: Secondary | ICD-10-CM | POA: Diagnosis not present

## 2020-05-09 NOTE — Patient Instructions (Signed)
We will see you as needed if symptoms flareup

## 2020-05-09 NOTE — Progress Notes (Signed)
Subjective:    Patient ID: Alexandria Moreno, female    DOB: 01-09-1941, 79 y.o.   MRN: [id number]  HPI Juanette is a 79 year old remote former smoker with issues with dyspnea and cough.  We last evaluated her here on 23rd March.  Since that visit she has had Protonix switch to omeprazole twice a day and her gastroesophageal reflux symptoms are markedly improved.  This has also led to total improvement of her cough issues.  She is currently not using any inhalers.  Not using Singulair.  She has not had any nasal symptoms.  Because her cough has resolved she stopped all of her inhalers as noted above.  She also has noted that her dyspnea has improved dramatically. She voices no other complaint today.  She looks and feels well.   Allergies  Allergen Reactions  . Aspirin Other (See Comments)    GI bleed  . Baclofen Diarrhea  . Codeine Nausea And Vomiting  . Imitrex  [Sumatriptan]     Palpatations  . Influenza Vaccines Swelling  . Vicodin  [Hydrocodone-Acetaminophen] Nausea And Vomiting   Immunization History  Administered Date(s) Administered  . Influenza-Unspecified 10/02/2014  . PFIZER SARS-COV-2 Vaccination 02/09/2020, 03/07/2020  . Pneumococcal Conjugate-13 10/02/2014  . Pneumococcal Polysaccharide-23 12/16/2007  . Td 05/15/2008  . Zoster 11/13/2013   Current Meds  Medication Sig  . apixaban (ELIQUIS) 5 MG TABS tablet Take 1 tablet (5 mg total) by mouth 2 (two) times daily.  Marland Kitchen atorvastatin (LIPITOR) 40 MG tablet TAKE 1 TABLET DAILY  . B COMPLEX VITAMINS PO Take by mouth daily.  . Calcium Citrate (CITRACAL PO) Take 1 tablet by mouth at bedtime.  . chlorhexidine (PERIDEX) 0.12 % solution as needed.   . cycloSPORINE (RESTASIS) 0.05 % ophthalmic emulsion Apply 0.05 drops to eye 2 (two) times daily.  . diphenoxylate-atropine (LOMOTIL) 2.5-0.025 MG tablet Take 1 tablet by mouth 4 (four) times daily.  Marland Kitchen dronedarone (MULTAQ) 400 MG tablet Take 1 tablet (400 mg total) by mouth 2 (two) times  daily with a meal.  . glucosamine-chondroitin 500-400 MG tablet Take 1 tablet by mouth 2 (two) times daily.  . Methylcellulose, Laxative, (FIBER THERAPY PO) Take 3 tablets by mouth daily.  . metoprolol succinate (TOPROL XL) 25 MG 24 hr tablet Take 0.5 tablets (12.5 mg total) by mouth daily.  . MULTIPLE VITAMIN PO Take by mouth daily.  Marland Kitchen omeprazole (PRILOSEC) 40 MG capsule Take 1 capsule (40 mg total) by mouth 2 (two) times daily.  . Probiotic Product (PROBIOTIC PO) Take 1 tablet by mouth daily.  . rizatriptan (MAXALT) 10 MG tablet Take 10 mg by mouth daily as needed.  . topiramate (TOPAMAX) 100 MG tablet Take 100 mg by mouth 2 (two) times daily.    Review of Systems A 10 point review of systems was performed and it is as noted above otherwise negative.    Objective:   Physical Exam BP 118/70 (BP Location: Left Arm, Cuff Size: Normal)   Pulse 63   Temp (!) 96.8 F (36 C) (Temporal)   Ht 5\' 4"  (1.626 m)   Wt 158 lb (71.7 kg)   SpO2 97%   BMI 27.12 kg/m   GENERAL: Awake, alert and oriented.  Fully ambulatory. HEAD: Normocephalic, atraumatic.  EYES: Pupils equal, round, reactive to light.  No scleral icterus.  MOUTH: Nose/mouth/throat not examined due to masking requirements for COVID 19. NECK: Supple. No thyromegaly. Trachea midline. No JVD.  No adenopathy. PULMONARY: Lungs clear to auscultation bilaterally. CARDIOVASCULAR:  S1 and S2. Regular rate and rhythm.  No rubs murmurs or gallops heard. GASTROINTESTINAL: Benign.  MUSCULOSKELETAL: No joint deformity, no clubbing, no edema.  NEUROLOGIC: Awake, alert, fully oriented.  No focal deficits. SKIN: Intact,warm,dry. PSYCH: Mood and behavior appropriate.       Assessment & Plan:     ICD-10-CM   1. Cough  R05    Well-controlled with management of GERD  2. Gastroesophageal reflux disease, unspecified whether esophagitis present  K21.9    This is her cough trigger Follow with gastroenterology  3. SOB (shortness of breath)   R06.02    Resolved   Discussion:  Patient has done well with better management of her gastroesophageal reflux.  She is adhering to antireflux measures and now taking omeprazole twice a day.  This is controlling her symptoms well.  She has not required any inhalers for control of her cough.  Likewise her dyspnea has resolved.  We will see her in follow-up on an as-needed basis.   Renold Don, MD  PCCM   *This note was dictated using voice recognition software/Dragon.  Despite best efforts to proofread, errors can occur which can change the meaning.  Any change was purely unintentional.

## 2020-05-30 ENCOUNTER — Other Ambulatory Visit: Payer: Self-pay | Admitting: Family Medicine

## 2020-05-30 DIAGNOSIS — Z1231 Encounter for screening mammogram for malignant neoplasm of breast: Secondary | ICD-10-CM

## 2020-06-02 ENCOUNTER — Other Ambulatory Visit: Payer: Self-pay | Admitting: Pulmonary Disease

## 2020-06-26 DIAGNOSIS — E538 Deficiency of other specified B group vitamins: Secondary | ICD-10-CM | POA: Diagnosis not present

## 2020-06-26 DIAGNOSIS — G43119 Migraine with aura, intractable, without status migrainosus: Secondary | ICD-10-CM | POA: Diagnosis not present

## 2020-06-26 DIAGNOSIS — E559 Vitamin D deficiency, unspecified: Secondary | ICD-10-CM | POA: Diagnosis not present

## 2020-06-26 DIAGNOSIS — E611 Iron deficiency: Secondary | ICD-10-CM | POA: Diagnosis not present

## 2020-06-26 DIAGNOSIS — R252 Cramp and spasm: Secondary | ICD-10-CM | POA: Diagnosis not present

## 2020-06-26 DIAGNOSIS — I480 Paroxysmal atrial fibrillation: Secondary | ICD-10-CM | POA: Diagnosis not present

## 2020-07-11 ENCOUNTER — Ambulatory Visit
Admission: RE | Admit: 2020-07-11 | Discharge: 2020-07-11 | Disposition: A | Discharge status: Home / Self Care | Payer: Medicare Other | Source: Ambulatory Visit | Attending: Family Medicine | Admitting: Family Medicine

## 2020-07-11 DIAGNOSIS — Z1231 Encounter for screening mammogram for malignant neoplasm of breast: Secondary | ICD-10-CM | POA: Diagnosis not present

## 2020-07-12 LAB — HM MAMMOGRAPHY

## 2020-07-13 ENCOUNTER — Encounter: Payer: Self-pay | Admitting: Family Medicine

## 2020-07-27 ENCOUNTER — Ambulatory Visit (INDEPENDENT_AMBULATORY_CARE_PROVIDER_SITE_OTHER): Payer: Medicare Other

## 2020-07-27 ENCOUNTER — Other Ambulatory Visit: Payer: Self-pay

## 2020-07-27 VITALS — BP 117/61 | HR 65 | Wt 154.0 lb

## 2020-07-27 DIAGNOSIS — Z0000 Encounter for general adult medical examination without abnormal findings: Secondary | ICD-10-CM

## 2020-07-27 NOTE — Progress Notes (Signed)
PCP notes:  Health Maintenance: Tdap- insurance/financial   Abnormal Screenings: none   Patient concerns: Discuss mammogram results   Nurse concerns: none   Next PCP appt.: 08/07/2020 @ 3 pm

## 2020-07-27 NOTE — Progress Notes (Signed)
Subjective:   Alexandria Moreno is a 79 y.o. female who presents for Medicare Annual (Subsequent) preventive examination.  Review of Systems: N/A      I connected with the patient today by telephone and verified that I am speaking with the correct person using two identifiers. Location patient: home Location nurse: work Persons participating in the virtual visit: patient, Marine scientist.   I discussed the limitations, risks, security and privacy concerns of performing an evaluation and management service by telephone and the availability of in person appointments. I also discussed with the patient that there may be a patient responsible charge related to this service. The patient expressed understanding and verbally consented to this telephonic visit.    Interactive audio and video telecommunications were attempted between this nurse and patient, however failed, due to patient having technical difficulties OR patient did not have access to video capability.  We continued and completed visit with audio only.     Cardiac Risk Factors include: advanced age (>6men, >53 women);dyslipidemia     Objective:    Today's Vitals   07/27/20 0850  BP: 117/61  Pulse: 65  Weight: 154 lb (69.9 kg)   Body mass index is 26.43 kg/m.  Advanced Directives 07/27/2020 11/11/2019 07/07/2018 12/01/2017 07/01/2017 05/22/2016 11/19/2015  Does Patient Have a Medical Advance Directive? Yes Yes Yes Yes Yes Yes Yes  Type of Paramedic of [place];Living will Living will;Healthcare Power of [place];Living will [place];Living will [place];Living will [place];Living will Living will  Does patient want to make changes to medical advance directive? - - - - - No - Patient declined -  Copy of [place] in Chart? Yes - validated most recent copy scanned in chart (See row information) No - copy  requested No - copy requested No - copy requested Yes No - copy requested No - copy requested  Would patient like information on creating a medical advance directive? - No - Patient declined - - - - -    Current Medications (verified) Outpatient Encounter Medications as of 07/27/2020  Medication Sig  . apixaban (ELIQUIS) 5 MG TABS tablet Take 1 tablet (5 mg total) by mouth 2 (two) times daily.  Marland Kitchen atorvastatin (LIPITOR) 40 MG tablet TAKE 1 TABLET DAILY  . B COMPLEX VITAMINS PO Take by mouth daily.  . Calcium Citrate (CITRACAL PO) Take 1 tablet by mouth at bedtime.  . chlorhexidine (PERIDEX) 0.12 % solution as needed.   . cycloSPORINE (RESTASIS) 0.05 % ophthalmic emulsion Apply 0.05 drops to eye 2 (two) times daily.  . diphenoxylate-atropine (LOMOTIL) 2.5-0.025 MG tablet Take 1 tablet by mouth 4 (four) times daily.  Marland Kitchen doxycycline (VIBRAMYCIN) 100 MG capsule Take 100 mg by mouth daily. 30 days only will finish on 07/28/2020  . dronedarone (MULTAQ) 400 MG tablet Take 1 tablet (400 mg total) by mouth 2 (two) times daily with a meal.  . glucosamine-chondroitin 500-400 MG tablet Take 1 tablet by mouth 2 (two) times daily.  . Methylcellulose, Laxative, (FIBER THERAPY PO) Take 3 tablets by mouth daily.  . metoprolol succinate (TOPROL XL) 25 MG 24 hr tablet Take 0.5 tablets (12.5 mg total) by mouth daily.  . MULTIPLE VITAMIN PO Take by mouth daily.  Marland Kitchen omeprazole (PRILOSEC) 40 MG capsule Take 1 capsule (40 mg total) by mouth 2 (two) times daily.  . Probiotic Product (PROBIOTIC PO) Take 1 tablet by mouth daily.  . rizatriptan (  MAXALT) 10 MG tablet Take 10 mg by mouth daily as needed.  . topiramate (TOPAMAX) 100 MG tablet Take 50 mg by mouth 2 (two) times daily.    No facility-administered encounter medications on file as of 07/27/2020.    Allergies (verified) Aspirin, Baclofen, Codeine, Imitrex  [sumatriptan], Influenza vaccines, and Vicodin  [hydrocodone-acetaminophen]   History: Past Medical  History:  Diagnosis Date  . Basal cell carcinoma 05/22/2015   R nasal ala  . Basal cell carcinoma 07/21/2017   L upper arm, L neck, L medial shoulder  . Basal cell carcinoma 09/21/2018   R shoulder  . Diverticulosis   . DJD (degenerative joint disease) of knee   . GERD (gastroesophageal reflux disease)   . H/o Iron deficiency anemia 2009   a. 05/2008 EGD/Colonoscopy: nl w/o evidence of bleeding; b. 11/2017 EGD: nl. Colonoscopy: 50mm cecal polyp, otw nl.  . H/O transfusion of whole blood 2009  . H/O: rheumatic fever    79 years old  . History of chicken pox   . History of echocardiogram    a. 09/2019 Echo: EF 60-65%, nl RV size/fxn. Nl Atrial sizes. Triv TR. Mild AI.  Marland Kitchen Hx of basal cell carcinoma 2010   L dorsum tip of nose (MOHS), R ant alar groove (MOHS)  . 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 Hyperlipidemia   . Migraines   . PAF (paroxysmal atrial fibrillation) ([place])    a. 08/2019 Zio: Avg HR 60 (min 43, max 144). PAF (2% burden), longest 1h 47m (avg rate 77). Rare PAC's and PVC's.  . Palpitation   . Pelvic prolapse    with rectocele  . Rosacea   . Seasonal allergic rhinitis    mild  . 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 Stress incontinence    Past Surgical History:  Procedure Laterality Date  . ABDOMINAL HYSTERECTOMY  1981  . APPENDECTOMY  1960  . capsule endoscopy  2009   WNL  . CATARACT EXTRACTION Bilateral [id number]  . COLONOSCOPY  2009   diverticulosis ([place])  . COLONOSCOPY WITH PROPOFOL N/A 12/01/2017   TA, rpt 5 yrs ([place])  . DEXA  06/2016   T -0.2 hip, 0.7 spine  . ESOPHAGOGASTRODUODENOSCOPY  2009   focal mild chronic gastritis, neg H pylori ([place])  . ESOPHAGOGASTRODUODENOSCOPY (EGD) WITH PROPOFOL N/A 12/01/2017   Procedure: ESOPHAGOGASTRODUODENOSCOPY (EGD) WITH PROPOFOL;  Surgeon: Lucilla Lame, MD;  Location[facility] END[facility]COPY;  Service: Endoscopy;  Laterality: N/A;  . INCONTINENCE SURGERY   2001   with mesh  . LIPOMA EXCISION  1971   right shoulder  . [place]  . OVARIAN CYST REMOVAL  1960  . SALPINGOOPHORECTOMY Left 2001  . TONSILLECTOMY  1958   Family History  Problem Relation Age of Onset  . Stroke Mother 75  . Cancer Mother        Cervical  . Depression Mother   . Dementia Mother        vascular  . Heart disease Father        CABG  . Alcohol abuse Father   . Osteoporosis Father   . Heart attack Father   . Hypertension Brother   . Heart attack Brother   . Stroke Brother   . Stroke Brother 100  . Heart attack Brother   . Stroke Paternal Grandfather   . Alzheimer's disease Maternal Grandfather   . Breast  cancer Neg Hx    Social History   Socioeconomic History  . Marital status: Married    Spouse name: Not on file  . Number of children: Not on file  . Years of education: Not on file  . Highest education level: Not on file  Occupational History  . Not on file  Tobacco Use  . Smoking status: Former Smoker    Packs/day: 0.50    Years: 20.00    Pack years: 10.00    Types: Cigarettes    Quit date: 12/15/1978    Years since quitting: 41.6  . Smokeless tobacco: Never Used  Vaping Use  . Vaping Use: Never used  Substance and Sexual Activity  . Alcohol use: No    Alcohol/week: 0.0 standard drinks  . Drug use: No  . Sexual activity: Never  Other Topics Concern  . Not on file  Social History Narrative   Lives with husband    Occupation: retired, was Art therapist   Edu: some college   Activity: stationary bicycle   Diet: good water, following slim fast diet, good vegetables   Social Determinants of Health   Financial Resource Strain: Low Risk   . Difficulty of Paying Living Expenses: Not hard at all  Food Insecurity: No Food Insecurity  . Worried About Charity fundraiser in the Last Year: Never true  . Ran Out of Food in the Last Year: Never true  Transportation Needs: No Transportation Needs  . Lack of Transportation  (Medical): No  . Lack of Transportation (Non-Medical): No  Physical Activity: Insufficiently Active  . Days of Exercise per Week: 1 day  . Minutes of Exercise per Session: 60 min  Stress: No Stress Concern Present  . Feeling of Stress : Not at all  Social Connections:   . Frequency of Communication with Friends and Family:   . Frequency of Social Gatherings with Friends and Family:   . Attends Religious Services:   . Active Member of Clubs or Organizations:   . Attends Archivist Meetings:   Marland Kitchen Marital Status:     Tobacco Counseling Counseling given: Not Answered   Clinical Intake:  Pre-visit preparation completed: Yes  Pain : No/denies pain     Nutritional Risks: None Diabetes: No  How often do you need to have someone help you when you read instructions, pamphlets, or other written materials from your doctor or pharmacy?: 1 - Never What is the last grade level you completed in school?: some college  Diabetic: No Nutrition Risk Assessment:  Has the patient had any N/V/D within the last 2 months?  No  Does the patient have any non-healing wounds?  No  Has the patient had any unintentional weight loss or weight gain?  No   Diabetes:  Is the patient diabetic?  No  If diabetic, was a CBG obtained today?  N/A Did the patient bring in their glucometer from home?  N/A How often do you monitor your CBG's? N/A.   Financial Strains and Diabetes Management:  Are you having any financial strains with the device, your supplies or your medication? N/A.  Does the patient want to be seen by Chronic Care Management for management of their diabetes?  N/A Would the patient like to be referred to a Nutritionist or for Diabetic Management?  N/A     Interpreter Needed?: No  Information entered by :: CJohnson, LPN   Activities of Daily Living In your present state of health, do you have  any difficulty performing the following activities: 07/27/2020  Hearing? N    Vision? N  Difficulty concentrating or making decisions? N  Walking or climbing stairs? N  Dressing or bathing? N  Doing errands, shopping? N  Preparing Food and eating ? N  Using the Toilet? N  In the past six months, have you accidently leaked urine? N  Do you have problems with loss of bowel control? N  Managing your Medications? N  Managing your Finances? N  Housekeeping or managing your Housekeeping? N  Some recent data might be hidden    Patient Care Team: Ria Bush, MD as PCP - General (Family Medicine) Kate Sable, MD as PCP - Cardiology (Cardiology) Lucilla Lame, MD as Consulting Physician (Gastroenterology) Brendolyn Patty, MD as Consulting Physician (Dermatology) Vladimir Crofts, MD as Consulting Physician (Neurology) Roque Cash., MD as Consulting Physician (Obstetrics and Gynecology) Alison Stalling, OD (Optometry)  Indicate any recent Medical Services you may have received from other than [facility] providers in the past year (date may be approximate).     Assessment:   This is a routine wellness examination for Alexandria Moreno.  Hearing/Vision screen  Hearing Screening   125Hz  250Hz  500Hz  1000Hz  2000Hz  3000Hz  4000Hz  6000Hz  8000Hz   Right ear:           Left ear:           Vision Screening Comments: Patient gets annual eye exams   Dietary issues and exercise activities discussed: Current Exercise Habits: Home exercise routine, Type of exercise: Other - see comments (exercise bike), Time (Minutes): 60, Frequency (Times/Week): 1, Weekly Exercise (Minutes/Week): 60, Intensity: Mild, Exercise limited by: None identified  Goals    . Increase physical activity     Starting 07/01/2017, I will continue to exercise for at least 15-20 min 3 times weekly.    . Increase physical activity     Starting 07/07/2018, I will continue to exercise for 15 minutes 3 days per week.     . Patient Stated     07/27/2020, I will continue to ride my exercise bike once a week for about  1 hour.      Depression Screen PHQ 2/9 Scores 07/27/2020 07/25/2019 07/07/2018 07/01/2017 05/22/2016 11/19/2015 05/18/2015  PHQ - 2 Score 0 0 0 0 0 0 0  PHQ- 9 Score 0 - 0 - - - -    Fall Risk Fall Risk  07/27/2020 07/25/2019 07/07/2018 07/01/2017 05/22/2016  Falls in the past year? 0 1 No No No  Number falls in past yr: 0 0 - - -  Injury with Fall? 0 1 - - -  Risk for fall due to : Medication side effect - - - -  Follow up Falls evaluation completed;Falls prevention discussed - - - -    Any stairs in or around the home? Yes  If so, are there any without handrails? No  Home free of loose throw rugs in walkways, pet beds, electrical cords, etc? Yes  Adequate lighting in your home to reduce risk of falls? Yes   ASSISTIVE DEVICES UTILIZED TO PREVENT FALLS:  Life alert? No  Use of a cane, walker or w/c? No  Grab bars in the bathroom? No  Shower chair or bench in shower? No  Elevated toilet seat or a handicapped toilet? No   TIMED UP AND GO:  Was the test performed? N/A, telephonic visit .   Cognitive Function: MMSE - Mini Mental State Exam 07/27/2020 07/07/2018 07/01/2017 05/22/2016  Orientation to time  5 5 5 5   Orientation to Place 5 5 5 5   Registration 3 3 3 3   Attention/ Calculation 5 0 0 0  Recall 3 3 3 3   Language- name 2 objects - 0 0 0  Language- repeat 1 1 1 1   Language- follow 3 step command - 3 3 3   Language- read & follow direction - 0 0 0  Write a sentence - 0 0 0  Copy design - 0 0 0  Total score - 20 20 20   Mini Cog  Mini-Cog screen was completed. Maximum score is 22. A value of 0 denotes this part of the MMSE was not completed or the patient failed this part of the Mini-Cog screening.       Immunizations Immunization History  Administered Date(s) Administered  . Influenza-Unspecified 10/02/2014  . PFIZER SARS-COV-2 Vaccination 02/09/2020, 03/07/2020  . Pneumococcal Conjugate-13 10/02/2014  . Pneumococcal Polysaccharide-23 12/16/2007  . Td 05/15/2008  . Zoster  11/13/2013    TDAP status: Due, Education has been provided regarding the importance of this vaccine. Advised may receive this vaccine at local pharmacy or Health Dept. Aware to provide a copy of the vaccination record if obtained from local pharmacy or Health Dept. Verbalized acceptance and understanding. Flu Vaccine status: allergic Pneumococcal vaccine status: Up to date Covid-19 vaccine status: Completed vaccines  Qualifies for Shingles Vaccine? Yes   Zostavax completed Yes   Shingrix Completed?: No.    Education has been provided regarding the importance of this vaccine. Patient has been advised to call insurance company to determine out of pocket expense if they have not yet received this vaccine. Advised may also receive vaccine at local pharmacy or Health Dept. Verbalized acceptance and understanding.  Screening Tests Health Maintenance  Topic Date Due  . Hepatitis C Screening  Never done  . DTAP VACCINES (1) 07/07/1941  . DTaP/Tdap/Td (2 - Tdap) 05/15/2018  . TETANUS/TDAP  07/27/2024 (Originally 05/15/2018)  . MAMMOGRAM  07/12/2021  . COLONOSCOPY  12/01/2022  . DEXA SCAN  Completed  . COVID-19 Vaccine  Completed  . PNA vac Low Risk Adult  Completed    Health Maintenance  Health Maintenance Due  Topic Date Due  . Hepatitis C Screening  Never done  . DTAP VACCINES (1) 07/07/1941  . DTaP/Tdap/Td (2 - Tdap) 05/15/2018    Colorectal cancer screening: Completed 12/01/2017. Repeat every 5 years Mammogram status: Completed 07/12/2020. Repeat every year Bone Density status: Completed 06/19/2016. Results reflect: Bone density results: NORMAL. Repeat every 2-5 years.  Lung Cancer Screening: (Low Dose CT Chest recommended if Age 85-80 years, 30 pack-year currently smoking OR have quit w/in 15years.) does not qualify.    Additional Screening:  Hepatitis C Screening: does qualify; Completed due  Vision Screening: Recommended annual ophthalmology exams for early detection of  glaucoma and other disorders of the eye. Is the patient up to date with their annual eye exam?  Yes  Who is the provider or what is the name of the office in which the patient attends annual eye exams? [facility], Dr. Wallace Going  If pt is not established with a provider, would they like to be referred to a provider to establish care? No .   Dental Screening: Recommended annual dental exams for proper oral hygiene  Community Resource Referral / Chronic Care Management: CRR required this visit?  No   CCM required this visit?  No      Plan:     I have personally reviewed and  noted the following in the patient's chart:   . Medical and social history . Use of alcohol, tobacco or illicit drugs  . Current medications and supplements . Functional ability and status . Nutritional status . Physical activity . Advanced directives . List of other physicians . Hospitalizations, surgeries, and ER visits in previous 12 months . Vitals . Screenings to include cognitive, depression, and falls . Referrals and appointments  In addition, I have reviewed and discussed with patient certain preventive protocols, quality metrics, and best practice recommendations. A written personalized care plan for preventive services as well as general preventive health recommendations were provided to patient.   Due to this being a telephonic visit, the after visit summary with patients personalized plan was offered to patient via mail or my-chart. Patient preferred to pick up at office at next visit.   Andrez Grime, LPN   [id number]

## 2020-07-27 NOTE — Patient Instructions (Signed)
Alexandria Moreno , Thank you for taking time to come for your Medicare Wellness Visit. I appreciate your ongoing commitment to your health goals. Please review the following plan we discussed and let me know if I can assist you in the future.   Screening recommendations/referrals: Colonoscopy: Up to date, completed 12/01/2017, due 11/2022 Mammogram: Up to date, completed 07/12/2020, due 06/2021 Bone Density: Up to date, completed 06/19/2016, due 2-5 years  Recommended yearly ophthalmology/optometry visit for glaucoma screening and checkup Recommended yearly dental visit for hygiene and checkup  Vaccinations: Influenza vaccine: allergic Pneumococcal vaccine: Completed series Tdap vaccine: decline- insurance/financial Shingles vaccine: due, check with your insurance regarding coverage   Covid-19:Completed series  Advanced directives: copy in chart  Conditions/risks identified: hyperlipidemia  Next appointment: Follow up in one year for your annual wellness visit    Preventive Care 65 Years and Older, Female Preventive care refers to lifestyle choices and visits with your health care provider that can promote health and wellness. What does preventive care include?  A yearly physical exam. This is also called an annual well check.  Dental exams once or twice a year.  Routine eye exams. Ask your health care provider how often you should have your eyes checked.  Personal lifestyle choices, including:  Daily care of your teeth and gums.  Regular physical activity.  Eating a healthy diet.  Avoiding tobacco and drug use.  Limiting alcohol use.  Practicing safe sex.  Taking low-dose aspirin every day.  Taking vitamin and mineral supplements as recommended by your health care provider. What happens during an annual well check? The services and screenings done by your health care provider during your annual well check will depend on your age, overall health, lifestyle risk factors, and  family history of disease. Counseling  Your health care provider may ask you questions about your:  Alcohol use.  Tobacco use.  Drug use.  Emotional well-being.  Home and relationship well-being.  Sexual activity.  Eating habits.  History of falls.  Memory and ability to understand (cognition).  Work and work Statistician.  Reproductive health. Screening  You may have the following tests or measurements:  Height, weight, and BMI.  Blood pressure.  Lipid and cholesterol levels. These may be checked every 5 years, or more frequently if you are over 6 years old.  Skin check.  Lung cancer screening. You may have this screening every year starting at age 72 if you have a 30-pack-year history of smoking and currently smoke or have quit within the past 15 years.  Fecal occult blood test (FOBT) of the stool. You may have this test every year starting at age 3.  Flexible sigmoidoscopy or colonoscopy. You may have a sigmoidoscopy every 5 years or a colonoscopy every 10 years starting at age 57.  Hepatitis C blood test.  Hepatitis B blood test.  Sexually transmitted disease (STD) testing.  Diabetes screening. This is done by checking your blood sugar (glucose) after you have not eaten for a while (fasting). You may have this done every 1-3 years.  Bone density scan. This is done to screen for osteoporosis. You may have this done starting at age 47.  Mammogram. This may be done every 1-2 years. Talk to your health care provider about how often you should have regular mammograms. Talk with your health care provider about your test results, treatment options, and if necessary, the need for more tests. Vaccines  Your health care provider may recommend certain vaccines, such as:  Influenza vaccine. This is recommended every year.  Tetanus, diphtheria, and acellular pertussis (Tdap, Td) vaccine. You may need a Td booster every 10 years.  Zoster vaccine. You may need this  after age 35.  Pneumococcal 13-valent conjugate (PCV13) vaccine. One dose is recommended after age 37.  Pneumococcal polysaccharide (PPSV23) vaccine. One dose is recommended after age 16. Talk to your health care provider about which screenings and vaccines you need and how often you need them. This information is not intended to replace advice given to you by your health care provider. Make sure you discuss any questions you have with your health care provider. Document Released: 12/28/2015 Document Revised: 08/20/2016 Document Reviewed: 10/02/2015 Elsevier Interactive Patient Education  2017 [employer] Prevention in the Home Falls can cause injuries. They can happen to people of all ages. There are many things you can do to make your home safe and to help prevent falls. What can I do on the outside of my home?  Regularly fix the edges of walkways and driveways and fix any cracks.  Remove anything that might make you trip as you walk through a door, such as a raised step or threshold.  Trim any bushes or trees on the path to your home.  Use bright outdoor lighting.  Clear any walking paths of anything that might make someone trip, such as rocks or tools.  Regularly check to see if handrails are loose or broken. Make sure that both sides of any steps have handrails.  Any raised decks and porches should have guardrails on the edges.  Have any leaves, snow, or ice cleared regularly.  Use sand or salt on walking paths during winter.  Clean up any spills in your garage right away. This includes oil or grease spills. What can I do in the bathroom?  Use night lights.  Install grab bars by the toilet and in the tub and shower. Do not use towel bars as grab bars.  Use non-skid mats or decals in the tub or shower.  If you need to sit down in the shower, use a plastic, non-slip stool.  Keep the floor dry. Clean up any water that spills on the floor as soon as it  happens.  Remove soap buildup in the tub or shower regularly.  Attach bath mats securely with double-sided non-slip rug tape.  Do not have throw rugs and other things on the floor that can make you trip. What can I do in the bedroom?  Use night lights.  Make sure that you have a light by your bed that is easy to reach.  Do not use any sheets or blankets that are too big for your bed. They should not hang down onto the floor.  Have a firm chair that has side arms. You can use this for support while you get dressed.  Do not have throw rugs and other things on the floor that can make you trip. What can I do in the kitchen?  Clean up any spills right away.  Avoid walking on wet floors.  Keep items that you use a lot in easy-to-reach places.  If you need to reach something above you, use a strong step stool that has a grab bar.  Keep electrical cords out of the way.  Do not use floor polish or wax that makes floors slippery. If you must use wax, use non-skid floor wax.  Do not have throw rugs and other things on the floor that  can make you trip. What can I do with my stairs?  Do not leave any items on the stairs.  Make sure that there are handrails on both sides of the stairs and use them. Fix handrails that are broken or loose. Make sure that handrails are as long as the stairways.  Check any carpeting to make sure that it is firmly attached to the stairs. Fix any carpet that is loose or worn.  Avoid having throw rugs at the top or bottom of the stairs. If you do have throw rugs, attach them to the floor with carpet tape.  Make sure that you have a light switch at the top of the stairs and the bottom of the stairs. If you do not have them, ask someone to add them for you. What else can I do to help prevent falls?  Wear shoes that:  Do not have high heels.  Have rubber bottoms.  Are comfortable and fit you well.  Are closed at the toe. Do not wear sandals.  If you  use a stepladder:  Make sure that it is fully opened. Do not climb a closed stepladder.  Make sure that both sides of the stepladder are locked into place.  Ask someone to hold it for you, if possible.  Clearly mark and make sure that you can see:  Any grab bars or handrails.  First and last steps.  Where the edge of each step is.  Use tools that help you move around (mobility aids) if they are needed. These include:  Canes.  Walkers.  Scooters.  Crutches.  Turn on the lights when you go into a dark area. Replace any light bulbs as soon as they burn out.  Set up your furniture so you have a clear path. Avoid moving your furniture around.  If any of your floors are uneven, fix them.  If there are any pets around you, be aware of where they are.  Review your medicines with your doctor. Some medicines can make you feel dizzy. This can increase your chance of falling. Ask your doctor what other things that you can do to help prevent falls. This information is not intended to replace advice given to you by your health care provider. Make sure you discuss any questions you have with your health care provider. Document Released: 09/27/2009 Document Revised: 05/08/2016 Document Reviewed: 01/05/2015 Elsevier Interactive Patient Education  2017 [employer].

## 2020-07-30 ENCOUNTER — Encounter: Payer: Medicare Other | Admitting: Family Medicine

## 2020-08-02 ENCOUNTER — Other Ambulatory Visit: Payer: Self-pay | Admitting: Family Medicine

## 2020-08-02 DIAGNOSIS — D751 Secondary polycythemia: Secondary | ICD-10-CM

## 2020-08-02 DIAGNOSIS — E785 Hyperlipidemia, unspecified: Secondary | ICD-10-CM

## 2020-08-02 DIAGNOSIS — I480 Paroxysmal atrial fibrillation: Secondary | ICD-10-CM

## 2020-08-02 DIAGNOSIS — E038 Other specified hypothyroidism: Secondary | ICD-10-CM

## 2020-08-02 DIAGNOSIS — E039 Hypothyroidism, unspecified: Secondary | ICD-10-CM

## 2020-08-03 ENCOUNTER — Other Ambulatory Visit: Payer: Self-pay

## 2020-08-03 ENCOUNTER — Other Ambulatory Visit: Payer: Self-pay | Admitting: Cardiology

## 2020-08-03 ENCOUNTER — Other Ambulatory Visit (INDEPENDENT_AMBULATORY_CARE_PROVIDER_SITE_OTHER): Payer: Medicare Other

## 2020-08-03 DIAGNOSIS — D751 Secondary polycythemia: Secondary | ICD-10-CM | POA: Diagnosis not present

## 2020-08-03 DIAGNOSIS — E038 Other specified hypothyroidism: Secondary | ICD-10-CM

## 2020-08-03 DIAGNOSIS — E785 Hyperlipidemia, unspecified: Secondary | ICD-10-CM | POA: Diagnosis not present

## 2020-08-03 DIAGNOSIS — E039 Hypothyroidism, unspecified: Secondary | ICD-10-CM

## 2020-08-03 LAB — CBC WITH DIFFERENTIAL/PLATELET
Basophils Absolute: 0 10*3/uL (ref 0.0–0.1)
Basophils Relative: 0.4 % (ref 0.0–3.0)
Eosinophils Absolute: 0.1 10*3/uL (ref 0.0–0.7)
Eosinophils Relative: 3.4 % (ref 0.0–5.0)
HCT: 42.7 % (ref 36.0–46.0)
Hemoglobin: 14.2 g/dL (ref 12.0–15.0)
Lymphocytes Relative: 36.4 % (ref 12.0–46.0)
Lymphs Abs: 1.3 10*3/uL (ref 0.7–4.0)
MCHC: 33.3 g/dL (ref 30.0–36.0)
MCV: 93.3 fl (ref 78.0–100.0)
Monocytes Absolute: 0.2 10*3/uL (ref 0.1–1.0)
Monocytes Relative: 4.8 % (ref 3.0–12.0)
Neutro Abs: 2 10*3/uL (ref 1.4–7.7)
Neutrophils Relative %: 55 % (ref 43.0–77.0)
Platelets: 143 10*3/uL — ABNORMAL LOW (ref 150.0–400.0)
RBC: 4.58 Mil/uL (ref 3.87–5.11)
RDW: 13.2 % (ref 11.5–15.5)
WBC: 3.7 10*3/uL — ABNORMAL LOW (ref 4.0–10.5)

## 2020-08-03 LAB — COMPREHENSIVE METABOLIC PANEL
ALT: 22 U/L (ref 0–35)
AST: 24 U/L (ref 0–37)
Albumin: 4.1 g/dL (ref 3.5–5.2)
Alkaline Phosphatase: 87 U/L (ref 39–117)
BUN: 15 mg/dL (ref 6–23)
CO2: 25 mEq/L (ref 19–32)
Calcium: 9.4 mg/dL (ref 8.4–10.5)
Chloride: 109 mEq/L (ref 96–112)
Creatinine, Ser: 0.89 mg/dL (ref 0.40–1.20)
GFR: 61.14 mL/min (ref >60.00)
Glucose, Bld: 81 mg/dL (ref 70–99)
Potassium: 4.1 mEq/L (ref 3.5–5.1)
Sodium: 142 mEq/L (ref 135–145)
Total Bilirubin: 0.6 mg/dL (ref 0.2–1.2)
Total Protein: 6.3 g/dL (ref 6.0–8.3)

## 2020-08-03 LAB — LIPID PANEL
Cholesterol: 141 mg/dL (ref 0–200)
HDL: 39.9 mg/dL (ref >39.00)
LDL Cholesterol: 62 mg/dL (ref 0–99)
NonHDL: 101.01
Total CHOL/HDL Ratio: 4
Triglycerides: 194 mg/dL — ABNORMAL HIGH (ref 0.0–149.0)
VLDL: 38.8 mg/dL (ref 0.0–40.0)

## 2020-08-03 LAB — TSH: TSH: 5.29 u[IU]/mL — ABNORMAL HIGH (ref 0.35–4.50)

## 2020-08-03 LAB — T4, FREE: Free T4: 0.74 ng/dL (ref 0.60–1.60)

## 2020-08-03 MED ORDER — APIXABAN 5 MG PO TABS
5.0000 mg | ORAL_TABLET | Freq: Two times a day (BID) | ORAL | 1 refills | Status: DC
Start: 2020-08-03 — End: 2020-12-27

## 2020-08-03 NOTE — Telephone Encounter (Signed)
*  STAT* If patient is at the pharmacy, call can be transferred to refill team.   1. Which medications need to be refilled? (please list name of each medication and dose if known)     eliquis 5 mg po BID   2. Which pharmacy/location (including street and city if local pharmacy) is medication to be sent to?  [facility]   3. Do they need a 30 day or 90 day supply? [place]

## 2020-08-03 NOTE — Telephone Encounter (Addendum)
Eliquis 5mg  refill request received. Patient is 79 years old, weight-69.9kg, Crea-0.81 on 12/21/2019, Diagnosis-Afib, and last seen by Dr. Garen Lah on 05/08/2020. Dose is appropriate based on dosing criteria. Will send in refill to requested pharmacy.

## 2020-08-03 NOTE — Telephone Encounter (Signed)
Please review for refill. Thanks!  

## 2020-08-07 ENCOUNTER — Encounter: Payer: Self-pay | Admitting: Family Medicine

## 2020-08-07 ENCOUNTER — Ambulatory Visit (INDEPENDENT_AMBULATORY_CARE_PROVIDER_SITE_OTHER): Payer: Medicare Other | Admitting: Family Medicine

## 2020-08-07 ENCOUNTER — Other Ambulatory Visit: Payer: Self-pay

## 2020-08-07 VITALS — BP 126/64 | HR 58 | Temp 98.1°F | Ht 62.5 in | Wt 154.5 lb

## 2020-08-07 DIAGNOSIS — D696 Thrombocytopenia, unspecified: Secondary | ICD-10-CM

## 2020-08-07 DIAGNOSIS — E039 Hypothyroidism, unspecified: Secondary | ICD-10-CM | POA: Diagnosis not present

## 2020-08-07 DIAGNOSIS — IMO0002 Reserved for concepts with insufficient information to code with codable children: Secondary | ICD-10-CM

## 2020-08-07 DIAGNOSIS — K219 Gastro-esophageal reflux disease without esophagitis: Secondary | ICD-10-CM | POA: Diagnosis not present

## 2020-08-07 DIAGNOSIS — E038 Other specified hypothyroidism: Secondary | ICD-10-CM

## 2020-08-07 DIAGNOSIS — E785 Hyperlipidemia, unspecified: Secondary | ICD-10-CM

## 2020-08-07 DIAGNOSIS — G43709 Chronic migraine without aura, not intractable, without status migrainosus: Secondary | ICD-10-CM | POA: Diagnosis not present

## 2020-08-07 DIAGNOSIS — D72819 Decreased white blood cell count, unspecified: Secondary | ICD-10-CM

## 2020-08-07 DIAGNOSIS — R221 Localized swelling, mass and lump, neck: Secondary | ICD-10-CM

## 2020-08-07 DIAGNOSIS — I480 Paroxysmal atrial fibrillation: Secondary | ICD-10-CM

## 2020-08-07 MED ORDER — ATORVASTATIN CALCIUM 40 MG PO TABS
40.0000 mg | ORAL_TABLET | Freq: Every day | ORAL | 3 refills | Status: DC
Start: 2020-08-07 — End: 2021-08-02

## 2020-08-07 NOTE — Assessment & Plan Note (Signed)
?  LN - check neck US.

## 2020-08-07 NOTE — Assessment & Plan Note (Signed)
Low burden. Continues multaq and eliquis and low dose toprol XL

## 2020-08-07 NOTE — Assessment & Plan Note (Addendum)
topamax was recently decreased - after recent kidney stone. Still remains migraine free for several months now.

## 2020-08-07 NOTE — Assessment & Plan Note (Signed)
Continue omeprazole 40mg  daily with 2nd dose when needed

## 2020-08-07 NOTE — Patient Instructions (Addendum)
If interested, check with pharmacy about new 2 shot shingles series (shingrix).  Return for lab visit only in 2-3 months (blood counts).  We will refer you for neck ultrasound on the right.  You are doing well today.  Return as needed or in 1 year for next wellness visit.  Return in 2-3 months for lab visit only  Health Maintenance After Age 79 After age 44, you are at a higher risk for certain long-term diseases and infections as well as injuries from falls. Falls are a major cause of broken bones and head injuries in people who are older than age 75. Getting regular preventive care can help to keep you healthy and well. Preventive care includes getting regular testing and making lifestyle changes as recommended by your health care provider. Talk with your health care provider about:  Which screenings and tests you should have. A screening is a test that checks for a disease when you have no symptoms.  A diet and exercise plan that is right for you. What should I know about screenings and tests to prevent falls? Screening and testing are the best ways to find a health problem early. Early diagnosis and treatment give you the best chance of managing medical conditions that are common after age 64. Certain conditions and lifestyle choices may make you more likely to have a fall. Your health care provider may recommend:  Regular vision checks. Poor vision and conditions such as cataracts can make you more likely to have a fall. If you wear glasses, make sure to get your prescription updated if your vision changes.  Medicine review. Work with your health care provider to regularly review all of the medicines you are taking, including over-the-counter medicines. Ask your health care provider about any side effects that may make you more likely to have a fall. Tell your health care provider if any medicines that you take make you feel dizzy or sleepy.  Osteoporosis screening. Osteoporosis is a  condition that causes the bones to get weaker. This can make the bones weak and cause them to break more easily.  Blood pressure screening. Blood pressure changes and medicines to control blood pressure can make you feel dizzy.  Strength and balance checks. Your health care provider may recommend certain tests to check your strength and balance while standing, walking, or changing positions.  Foot health exam. Foot pain and numbness, as well as not wearing proper footwear, can make you more likely to have a fall.  Depression screening. You may be more likely to have a fall if you have a fear of falling, feel emotionally low, or feel unable to do activities that you used to do.  Alcohol use screening. Using too much alcohol can affect your balance and may make you more likely to have a fall. What actions can I take to lower my risk of falls? General instructions  Talk with your health care provider about your risks for falling. Tell your health care provider if: ? You fall. Be sure to tell your health care provider about all falls, even ones that seem minor. ? You feel dizzy, sleepy, or off-balance.  Take over-the-counter and prescription medicines only as told by your health care provider. These include any supplements.  Eat a healthy diet and maintain a healthy weight. A healthy diet includes low-fat dairy products, low-fat (lean) meats, and fiber from whole grains, beans, and lots of fruits and vegetables. Home safety  Remove any tripping hazards, such as rugs,  cords, and clutter.  Install safety equipment such as grab bars in bathrooms and safety rails on stairs.  Keep rooms and walkways well-lit. Activity   Follow a regular exercise program to stay fit. This will help you maintain your balance. Ask your health care provider what types of exercise are appropriate for you.  If you need a cane or walker, use it as recommended by your health care provider.  Wear supportive shoes  that have nonskid soles. Lifestyle  Do not drink alcohol if your health care provider tells you not to drink.  If you drink alcohol, limit how much you have: ? 0-1 drink a day for women. ? 0-2 drinks a day for men.  Be aware of how much alcohol is in your drink. In the [place] one drink equals one typical bottle of beer (12 oz), one-half glass of wine (5 oz), or one shot of hard liquor (1 oz).  Do not use any products that contain nicotine or tobacco, such as cigarettes and e-cigarettes. If you need help quitting, ask your health care provider. Summary  Having a healthy lifestyle and getting preventive care can help to protect your health and wellness after age 25.  Screening and testing are the best way to find a health problem early and help you avoid having a fall. Early diagnosis and treatment give you the best chance for managing medical conditions that are more common for people who are older than age 61.  Falls are a major cause of broken bones and head injuries in people who are older than age 52. Take precautions to prevent a fall at home.  Work with your health care provider to learn what changes you can make to improve your health and wellness and to prevent falls. This information is not intended to replace advice given to you by your health care provider. Make sure you discuss any questions you have with your health care provider. Document Revised: 03/24/2019 Document Reviewed: 10/14/2017 Elsevier Patient Education  2020 [employer].

## 2020-08-07 NOTE — Progress Notes (Addendum)
This visit was conducted in person.  BP 126/64 (BP Location: Left Arm, Patient Position: Sitting, Cuff Size: Normal)   Pulse (!) 58   Temp 98.1 F (36.7 C) (Temporal)   Ht 5' 2.5" (1.588 m)   Wt 154 lb 8 oz (70.1 kg)   SpO2 96%   BMI 27.81 kg/m    CC: AMW f/u visit  Subjective:    Patient ID: Alexandria Moreno, female    DOB: 08/12/1941, 79 y.o.   MRN: [id number]  HPI: Alexandria Moreno is a 79 y.o. female presenting on 08/07/2020 for Annual Exam (Prt 2. )   Saw health advisor 2 wks ago for medicare wellness visit. Note reviewed.    No exam data present    Clinical Support from 07/27/2020 in [place] at [facility] Total Score 0      Fall Risk  07/27/2020 07/25/2019 07/07/2018 07/01/2017 05/22/2016  Falls in the past year? 0 1 No No No  Number falls in past yr: 0 0 - - -  Injury with Fall? 0 1 - - -  Risk for fall due to : Medication side effect - - - -  Follow up Falls evaluation completed;Falls prevention discussed - - - -   PPI BID has helped GER symptoms and breathing. Using PRN  PAF on multaq and eliquis and toprol 12.5mg  daily.   Preventative: COLONOSCOPY/capsule endoscopyDate: 2009 diverticulosis ([place]) COLONOSCOPY WITH PROPOFOL 12/01/2017 TA, rpt 5 yrs ([facility]) Mammogram (3D screen) WNL 06/2020  [facility]. Known h/o rectocele. S/p hysterectomy and ovaries removed for heavy bleeding (1981).  Lung cancer screening - not eligible  DEXA Date: 06/2016 T -0.2 hip, 0.7 spine  Flu ALLERGY  Prevnar 2015, pneumovax 2009  [place] 01/2020, 02/2020  Td 2009  zostavax 2014  Shingrix - discussed to check with pharmacy  Advanced directives - scanned 07/2016. No life prolonging measures if terminal irreversible condition. Separate HCPOA form filled out - Bruce husband then [employer] are [employer]. Scanned 08/2017.  Seat belt use discussed.  Sunscreen use discussed. No changing moles on skin. (Dr Nicole Kindred) Ex  smoker - quit 1980.  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 Interim medical history since our last visit reviewed. Allergies and medications reviewed and updated. Outpatient Medications Prior to Visit  Medication Sig Dispense Refill  . apixaban (ELIQUIS) 5 MG TABS tablet Take 1 tablet (5 mg total) by mouth 2 (two) times daily. 180 tablet 1  . B COMPLEX VITAMINS PO Take by mouth daily.    . Calcium Citrate (CITRACAL PO) Take 1 tablet by mouth at bedtime.    . chlorhexidine (PERIDEX) 0.12 % solution as needed.   0  . cycloSPORINE (RESTASIS) 0.05 % ophthalmic emulsion Apply 0.05 drops to eye 2 (two) times daily.    . diphenoxylate-atropine (LOMOTIL) 2.5-0.025 MG tablet Take 1 tablet by mouth 4 (four) times daily. 360 tablet 3  . dronedarone (MULTAQ) 400 MG tablet Take 1 tablet (400 mg total) by mouth 2 (two) times daily with a meal. 180 tablet 3  . glucosamine-chondroitin 500-400 MG tablet Take 1 tablet by mouth 2 (two) times  daily.    . Methylcellulose, Laxative, (FIBER THERAPY PO) Take 3 tablets by mouth daily.    . metoprolol succinate (TOPROL XL) 25 MG 24 hr tablet Take 0.5 tablets (12.5 mg total) by mouth daily. 90 tablet 3  . MULTIPLE VITAMIN PO Take by mouth daily.    Marland Kitchen omeprazole (PRILOSEC) 40 MG capsule Take 1 capsule (40 mg total) by mouth 2 (two) times daily. 180 capsule 3  . rizatriptan (MAXALT) 10 MG tablet Take 10 mg by mouth daily as needed.    . topiramate (TOPAMAX) 50 MG tablet Take 1 tablet (50 mg total) by mouth at bedtime.    Marland Kitchen atorvastatin (LIPITOR) 40 MG tablet TAKE 1 TABLET DAILY 90 tablet 3  . topiramate (TOPAMAX) 100 MG tablet Take 50 mg by mouth 2 (two)  times daily.     Marland Kitchen doxycycline (VIBRAMYCIN) 100 MG capsule Take 100 mg by mouth daily. 30 days only will finish on 07/28/2020    . Probiotic Product (PROBIOTIC PO) Take 1 tablet by mouth daily.     No facility-administered medications prior to visit.     Per HPI unless specifically indicated in ROS section below Review of Systems Objective:  BP 126/64 (BP Location: Left Arm, Patient Position: Sitting, Cuff Size: Normal)   Pulse (!) 58   Temp 98.1 F (36.7 C) (Temporal)   Ht 5' 2.5" (1.588 m)   Wt 154 lb 8 oz (70.1 kg)   SpO2 96%   BMI 27.81 kg/m   Wt Readings from Last 3 Encounters:  08/07/20 154 lb 8 oz (70.1 kg)  07/27/20 154 lb (69.9 kg)  05/09/20 158 lb (71.7 kg)      Physical Exam Vitals and nursing note reviewed.  Constitutional:      General: She is not in acute distress.    Appearance: Normal appearance. She is well-developed. She is not ill-appearing.  HENT:     Head: Normocephalic and atraumatic.     Right Ear: Hearing, tympanic membrane, ear canal and external ear normal.     Left Ear: Hearing, tympanic membrane, ear canal and external ear normal.     Mouth/Throat:     Pharynx: Uvula midline.  Eyes:     General: No scleral icterus.    Extraocular Movements: Extraocular movements intact.     Conjunctiva/sclera: Conjunctivae normal.     Pupils: Pupils are equal, round, and reactive to light.  Neck:     Vascular: No carotid bruit.  Cardiovascular:     Rate and Rhythm: Normal rate and regular rhythm.     Pulses: Normal pulses.          Radial pulses are 2+ on the right side and 2+ on the left side.     Heart sounds: Normal heart sounds. No murmur heard.   Pulmonary:     Effort: Pulmonary effort is normal. No respiratory distress.     Breath sounds: Normal breath sounds. No wheezing, rhonchi or rales.  Abdominal:     General: Bowel sounds are normal. There is no distension.     Palpations: Abdomen is soft. There is no mass.     Tenderness: There is no  abdominal tenderness. There is no guarding or rebound.     Hernia: No hernia is present.  Musculoskeletal:        General: Normal range of motion.     Cervical back: Normal range of motion and neck supple.     Right lower leg: No edema.  Left lower leg: No edema.  Lymphadenopathy:     Head:     Right side of head: No submental, submandibular, tonsillar, preauricular or posterior auricular adenopathy.     Left side of head: No submental, submandibular, tonsillar, preauricular or posterior auricular adenopathy.     Cervical: Cervical adenopathy (small LN R posterior cervical) present.     Right cervical: Posterior cervical adenopathy present. No superficial or deep cervical adenopathy.    Left cervical: No superficial, deep or posterior cervical adenopathy.     Upper Body:     Right upper body: No supraclavicular adenopathy.     Left upper body: No supraclavicular adenopathy.  Skin:    General: Skin is warm and dry.     Findings: No rash.  Neurological:     General: No focal deficit present.     Mental Status: She is alert and oriented to person, place, and time.     Comments: CN grossly intact, station and gait intact  Psychiatric:        Mood and Affect: Mood normal.        Behavior: Behavior normal.        Thought Content: Thought content normal.        Judgment: Judgment normal.       Results for orders placed or performed in visit on 08/03/20  T4, free  Result Value Ref Range   Free T4 0.74 0.60 - 1.60 ng/dL  Lipid panel  Result Value Ref Range   Cholesterol 141 0 - 200 mg/dL   Triglycerides 194.0 (H) 0 - 149 mg/dL   HDL 39.90 >39.00 mg/dL   VLDL 38.8 0.0 - 40.0 mg/dL   LDL Cholesterol 62 0 - 99 mg/dL   Total CHOL/HDL Ratio 4    NonHDL 101.01   Comprehensive metabolic panel  Result Value Ref Range   Sodium 142 135 - 145 mEq/L   Potassium 4.1 3.5 - 5.1 mEq/L   Chloride 109 96 - 112 mEq/L   CO2 25 19 - 32 mEq/L   Glucose, Bld 81 70 - 99 mg/dL   BUN 15 6 - 23  mg/dL   Creatinine, Ser 0.89 0.40 - 1.20 mg/dL   Total Bilirubin 0.6 0.2 - 1.2 mg/dL   Alkaline Phosphatase 87 39 - 117 U/L   AST 24 0 - 37 U/L   ALT 22 0 - 35 U/L   Total Protein 6.3 6.0 - 8.3 g/dL   Albumin 4.1 3.5 - 5.2 g/dL   GFR 61.14 >60.00 mL/min   Calcium 9.4 8.4 - 10.5 mg/dL  TSH  Result Value Ref Range   TSH 5.29 (H) 0.35 - 4.50 uIU/mL  CBC with Differential/Platelet  Result Value Ref Range   WBC 3.7 (L) 4.0 - 10.5 K/uL   RBC 4.58 3.87 - 5.11 Mil/uL   Hemoglobin 14.2 12.0 - 15.0 g/dL   HCT 42.7 36 - 46 %   MCV 93.3 78.0 - 100.0 fl   MCHC 33.3 30.0 - 36.0 g/dL   RDW 13.2 11.5 - 15.5 %   Platelets 143.0 (L) 150 - 400 K/uL   Neutrophils Relative % 55.0 43 - 77 %   Lymphocytes Relative 36.4 12 - 46 %   Monocytes Relative 4.8 3 - 12 %   Eosinophils Relative 3.4 0 - 5 %   Basophils Relative 0.4 0 - 3 %   Neutro Abs 2.0 1.4 - 7.7 K/uL   Lymphs Abs 1.3 0.7 - 4.0 K/uL   Monocytes  Absolute 0.2 0 - 1 K/uL   Eosinophils Absolute 0.1 0 - 0 K/uL   Basophils Absolute 0.0 0 - 0 K/uL   Assessment & Plan:  This visit occurred during the SARS-CoV-2 public health emergency.  Safety protocols were in place, including screening questions prior to the visit, additional usage of staff PPE, and extensive cleaning of exam room while observing appropriate contact time as indicated for disinfecting solutions.   Problem List Items Addressed This Visit    Thrombocytopenia ([place])    Mild over the years, newly noted leukopenia this year. Will return for recheck labs in 2-3 months.       Relevant Orders   CBC with Differential/Platelet   Pathologist smear review   Vitamin B12   Folate   Subclinical hypothyroidism    fT4 normal - continue to monitor.       Paroxysmal atrial fibrillation (HCC)    Low burden. Continues multaq and eliquis and low dose toprol XL      Relevant Medications   atorvastatin (LIPITOR) 40 MG tablet   Mass of lateral neck - Primary    ?LN - check neck US.         Relevant Orders   US SOFT TISSUE HEAD & NECK (NON-THYROID)   GERD (gastroesophageal reflux disease)    Continue omeprazole 40mg  daily with 2nd dose when needed      Dyslipidemia    Chronic, stable. Continue atorvastatin. The 10-year ASCVD risk score Mikey Bussing DC Brooke Bonito., et al., 2013) is: 22.7%   Values used to calculate the score:     Age: 53 years     Sex: Female     Is Non-Hispanic African American: No     Diabetic: No     Tobacco smoker: No     Systolic Blood Pressure: 833 mmHg     Is BP treated: No     HDL Cholesterol: 39.9 mg/dL     Total Cholesterol: 141 mg/dL       Relevant Medications   atorvastatin (LIPITOR) 40 MG tablet   Chronic migraine (Chronic)    topamax was recently decreased - after recent kidney stone. Still remains migraine free for several months now.       Relevant Medications   atorvastatin (LIPITOR) 40 MG tablet   topiramate (TOPAMAX) 50 MG tablet    Other Visit Diagnoses    Leukopenia, unspecified type       Relevant Orders   CBC with Differential/Platelet   Pathologist smear review   Vitamin B12   Folate       Meds ordered this encounter  Medications  . atorvastatin (LIPITOR) 40 MG tablet    Sig: Take 1 tablet (40 mg total) by mouth daily.    Dispense:  90 tablet    Refill:  3   Orders Placed This Encounter  Procedures  . US SOFT TISSUE HEAD & NECK (NON-THYROID)    Standing Status:   Future    Standing Expiration Date:   08/07/2021    Order Specific Question:   Reason for Exam (SYMPTOM  OR DIAGNOSIS REQUIRED)    Answer:   R lateral neck pass ?posterior cervical LAD    Order Specific Question:   Preferred imaging location?    Answer:   [facility]  . CBC with Differential/Platelet    Standing Status:   Future    Standing Expiration Date:   08/07/2021  . Pathologist smear review    Standing Status:   Future  Standing Expiration Date:   08/07/2021  . Vitamin B12    Standing Status:   Future    Standing Expiration Date:    08/07/2021  . Folate    Standing Status:   Future    Standing Expiration Date:   08/07/2021    Patient instructions: If interested, check with pharmacy about new 2 shot shingles series (shingrix).  Return for lab visit only in 2-3 months (blood counts).  We will refer you for neck ultrasound on the right.  You are doing well today.  Return as needed or in 1 year for next wellness visit.  Return in 2-3 months for lab visit only  Follow up plan: Return in about 1 year (around 08/07/2021) for medicare wellness visit.  Ria Bush, MD

## 2020-08-07 NOTE — Assessment & Plan Note (Signed)
fT4 normal - continue to monitor.

## 2020-08-07 NOTE — Assessment & Plan Note (Signed)
Mild over the years, newly noted leukopenia this year. Will return for recheck labs in 2-3 months.

## 2020-08-07 NOTE — Assessment & Plan Note (Signed)
Chronic, stable. Continue atorvastatin. The 10-year ASCVD risk score Mikey Bussing DC Brooke Bonito., et al., 2013) is: 22.7%   Values used to calculate the score:     Age: 79 years     Sex: Female     Is Non-Hispanic African American: No     Diabetic: No     Tobacco smoker: No     Systolic Blood Pressure: 219 mmHg     Is BP treated: No     HDL Cholesterol: 39.9 mg/dL     Total Cholesterol: 141 mg/dL

## 2020-08-17 ENCOUNTER — Ambulatory Visit
Admission: RE | Admit: 2020-08-17 | Discharge: 2020-08-17 | Disposition: A | Discharge status: Home / Self Care | Payer: Medicare Other | Source: Ambulatory Visit | Attending: Family Medicine | Admitting: Family Medicine

## 2020-08-17 ENCOUNTER — Other Ambulatory Visit: Payer: Self-pay

## 2020-08-17 DIAGNOSIS — R221 Localized swelling, mass and lump, neck: Secondary | ICD-10-CM | POA: Insufficient documentation

## 2020-08-17 DIAGNOSIS — E041 Nontoxic single thyroid nodule: Secondary | ICD-10-CM | POA: Diagnosis not present

## 2020-09-08 ENCOUNTER — Other Ambulatory Visit: Payer: Self-pay | Admitting: Family Medicine

## 2020-09-14 ENCOUNTER — Other Ambulatory Visit: Payer: Self-pay

## 2020-09-14 ENCOUNTER — Ambulatory Visit
Admission: RE | Admit: 2020-09-14 | Discharge: 2020-09-14 | Disposition: A | Discharge status: Home / Self Care | Payer: Medicare Other | Attending: Family Medicine | Admitting: Family Medicine

## 2020-09-14 ENCOUNTER — Ambulatory Visit
Admission: RE | Admit: 2020-09-14 | Discharge: 2020-09-14 | Disposition: A | Discharge status: Home / Self Care | Payer: Medicare Other | Source: Ambulatory Visit | Attending: Family Medicine | Admitting: Family Medicine

## 2020-09-14 ENCOUNTER — Ambulatory Visit
Admission: EM | Admit: 2020-09-14 | Discharge: 2020-09-14 | Disposition: A | Discharge status: Home / Self Care | Payer: Medicare Other | Attending: Family Medicine | Admitting: Family Medicine

## 2020-09-14 ENCOUNTER — Encounter: Payer: Self-pay | Admitting: Emergency Medicine

## 2020-09-14 DIAGNOSIS — S92352A Displaced fracture of fifth metatarsal bone, left foot, initial encounter for closed fracture: Secondary | ICD-10-CM | POA: Insufficient documentation

## 2020-09-14 DIAGNOSIS — S92355A Nondisplaced fracture of fifth metatarsal bone, left foot, initial encounter for closed fracture: Secondary | ICD-10-CM

## 2020-09-14 DIAGNOSIS — Y939 Activity, unspecified: Secondary | ICD-10-CM | POA: Insufficient documentation

## 2020-09-14 DIAGNOSIS — W19XXXA Unspecified fall, initial encounter: Secondary | ICD-10-CM | POA: Insufficient documentation

## 2020-09-14 DIAGNOSIS — M79672 Pain in left foot: Secondary | ICD-10-CM | POA: Diagnosis not present

## 2020-09-14 DIAGNOSIS — Y929 Unspecified place or not applicable: Secondary | ICD-10-CM | POA: Insufficient documentation

## 2020-09-14 DIAGNOSIS — S92309A Fracture of unspecified metatarsal bone(s), unspecified foot, initial encounter for closed fracture: Secondary | ICD-10-CM

## 2020-09-14 HISTORY — DX: Fracture of unspecified metatarsal bone(s), unspecified foot, initial encounter for closed fracture: S92.309A

## 2020-09-14 NOTE — ED Triage Notes (Signed)
Pt states her shoe stuck to the wooden floor this morning and she tripped and fell. Pt states she twisted her left foot and has pain on the side. She states her foot was turning blue and swelling so her husband wrapped it.

## 2020-09-14 NOTE — ED Provider Notes (Signed)
[place]   [id number] 09/14/20 Arrival Time: 65  BH:ALPFX PAIN  SUBJECTIVE: History from: patient. Alexandria Moreno is a 79 y.o. female complains of lateral left foot pain that began last night.  Reports that she tripped over her shoe and fell at home.  Has not taken any medications for this.  Reports that it hurts to walk on and cannot bear full weight.  Reports limited range of motion in fourth and fifth toes of the left foot.  Describes the pain as constant and intermittently sharp.   Symptoms are made worse with activity.  Denies similar symptoms in the past.  Denies fever, chills, erythema,  effusion, weakness, numbness and tingling, saddle paresthesias, loss of bowel or bladder function.      ROS: As per HPI.  All other pertinent ROS negative.     Past Medical History:  Diagnosis Date  . Basal cell carcinoma 05/22/2015   R nasal ala  . Basal cell carcinoma 07/21/2017   L upper arm, L neck, L medial shoulder  . Basal cell carcinoma 09/21/2018   R shoulder  . Diverticulosis   . DJD (degenerative joint disease) of knee   . GERD (gastroesophageal reflux disease)   . H/o Iron deficiency anemia 2009   a. 05/2008 EGD/Colonoscopy: nl w/o evidence of bleeding; b. 11/2017 EGD: nl. Colonoscopy: 17mm cecal polyp, otw nl.  . H/O transfusion of whole blood 2009  . H/O: rheumatic fever    79 years old  . History of chicken pox   . History of echocardiogram    a. 09/2019 Echo: EF 60-65%, nl RV size/fxn. Nl Atrial sizes. Triv TR. Mild AI.  Marland Kitchen Hx of basal cell carcinoma 2010   L dorsum tip of nose (MO[facility]), R ant alar groove (MOHS)  . 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 Hyperlipidemia   . Migraines   . PAF (paroxysmal atrial fibrillation) ([place])    a. 08/2019 [facility]: Avg HR 60 (min 43, max 144). PAF (2% burden), longest 1h 50m (avg rate 77). Rare PAC's and PVC's.  . Palpitation   . Pelvic prolapse    with rectocele  . Rosacea   . Seasonal allergic  rhinitis    mild  . 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 Stress incontinence    Past Surgical History:  Procedure Laterality Date  . ABDOMINAL HYSTERECTOMY  1981  . APPENDECTOMY  1960  . capsule endoscopy  2009   WNL  . CATARACT EXTRACTION Bilateral [id number]  . COLONOSCOPY  2009   diverticulosis ([place])  . COLONOSCOPY WITH PROPOFOL N/A 12/01/2017   TA, rpt 5 yrs ([facility])  . DEXA  06/2016   T -0.2 hip, 0.7 spine  . ESOPHAGOGASTRODUODENOSCOPY  2009   focal mild chronic gastritis, neg H pylori ([place])  . ESOPHAGOGASTRODUODENOSCOPY (EGD) WITH PROPOFOL N/A 12/01/2017   Procedure: ESOPHAGOGASTRODUODENOSCOPY (EGD) WITH PROPOFOL;  Surgeon: Lucilla Lame, MD;  Location: [facility] ENDOSCOPY;  Service: Endoscopy;  Laterality: N/A;  . INCONTINENCE SURGERY  2001   with mesh  . LIPOMA EXCISION  1971   right shoulder  . [place]  . OVARIAN CYST REMOVAL  1960  . SALPINGOOPHORECTOMY Left 2001  . TONSILLECTOMY  1958   Allergies  Allergen Reactions  . Aspirin Other (See Comments)    GI bleed  . Baclofen Diarrhea  . Codeine Nausea And Vomiting  . Imitrex  [Sumatriptan]  Palpatations  . Influenza Vaccines Swelling  . Vicodin  [Hydrocodone-Acetaminophen] Nausea And Vomiting   No current facility-administered medications on file prior to encounter.   Current Outpatient Medications on File Prior to Encounter  Medication Sig Dispense Refill  . apixaban (ELIQUIS) 5 MG TABS tablet Take 1 tablet (5 mg total) by mouth 2 (two) times daily. 180 tablet 1  . atorvastatin (LIPITOR) 40 MG tablet Take 1 tablet (40 mg total) by mouth daily. 90 tablet 3  . B COMPLEX VITAMINS PO Take by mouth daily.    . Calcium Citrate (CITRACAL PO) Take 1 tablet by mouth at bedtime.    . chlorhexidine (PERIDEX) 0.12 % solution as needed.   0  . cycloSPORINE (RESTASIS) 0.05 % ophthalmic emulsion Apply 0.05 drops to eye 2 (two) times daily.    . diphenoxylate-atropine (LOMOTIL)  2.5-0.025 MG tablet Take 1 tablet by mouth 4 (four) times daily. 360 tablet 3  . dronedarone (MULTAQ) 400 MG tablet Take 1 tablet (400 mg total) by mouth 2 (two) times daily with a meal. 180 tablet 3  . glucosamine-chondroitin 500-400 MG tablet Take 1 tablet by mouth 2 (two) times daily.    . Methylcellulose, Laxative, (FIBER THERAPY PO) Take 3 tablets by mouth daily.    . metoprolol succinate (TOPROL-XL) 25 MG 24 hr tablet TAKE 1 TABLET DAILY 90 tablet 0  . MULTIPLE VITAMIN PO Take by mouth daily.    Marland Kitchen omeprazole (PRILOSEC) 40 MG capsule Take 1 capsule (40 mg total) by mouth 2 (two) times daily. 180 capsule 3  . rizatriptan (MAXALT) 10 MG tablet Take 10 mg by mouth daily as needed.    . topiramate (TOPAMAX) 50 MG tablet Take 1 tablet (50 mg total) by mouth at bedtime.     Social History   Socioeconomic History  . Marital status: Married    Spouse name: Not on file  . Number of children: Not on file  . Years of education: Not on file  . Highest education level: Not on file  Occupational History  . Not on file  Tobacco Use  . Smoking status: Former Smoker    Packs/day: 0.50    Years: 20.00    Pack years: 10.00    Types: Cigarettes    Quit date: 12/15/1978    Years since quitting: 41.7  . Smokeless tobacco: Never Used  Vaping Use  . Vaping Use: Never used  Substance and Sexual Activity  . Alcohol use: No    Alcohol/week: 0.0 standard drinks  . Drug use: No  . Sexual activity: Never  Other Topics Concern  . Not on file  Social History Narrative   Lives with husband    Occupation: retired, was Art therapist   Edu: some college   Activity: stationary bicycle   Diet: good water, following slim fast diet, good vegetables   Social Determinants of Health   Financial Resource Strain: Low Risk   . Difficulty of Paying Living Expenses: Not hard at all  Food Insecurity: No Food Insecurity  . Worried About Charity fundraiser in the Last Year: Never true  . Ran Out of Food in  the Last Year: Never true  Transportation Needs: No Transportation Needs  . Lack of Transportation (Medical): No  . Lack of Transportation (Non-Medical): No  Physical Activity: Insufficiently Active  . Days of Exercise per Week: 1 day  . Minutes of Exercise per Session: 60 min  Stress: No Stress Concern Present  . Feeling of Stress :  Not at all  Social Connections:   . Frequency of Communication with Friends and Family: Not on file  . Frequency of Social Gatherings with Friends and Family: Not on file  . Attends Religious Services: Not on file  . Active Member of Clubs or Organizations: Not on file  . Attends Archivist Meetings: Not on file  . Marital Status: Not on file  Intimate Partner Violence: Not At Risk  . Fear of Current or Ex-Partner: No  . Emotionally Abused: No  . Physically Abused: No  . Sexually Abused: No   Family History  Problem Relation Age of Onset  . Stroke Mother 60  . Cancer Mother        Cervical  . Depression Mother   . Dementia Mother        vascular  . Heart disease Father        CABG  . Alcohol abuse Father   . Osteoporosis Father   . Heart attack Father   . Hypertension Brother   . Heart attack Brother   . Stroke Brother   . Stroke Brother 72  . Heart attack Brother   . Stroke Paternal Grandfather   . Alzheimer's disease Maternal Grandfather   . Breast cancer Neg Hx     OBJECTIVE:  Vitals:   09/14/20 1155  BP: 126/77  Pulse: (!) 52  Temp: 98.9 F (37.2 C)  TempSrc: Oral  SpO2: 100%    General appearance: ALERT; in no acute distress.  Head: NCAT Lungs: Normal respiratory effort CV: pulses 2+ bilaterally. Cap refill < 2 seconds Musculoskeletal:  Inspection: Skin warm, dry, clear and intact without obvious erythema.  Obvious ecchymosis to lateral aspect of the foot Palpation: Lateral aspect of left foot very tender to palpation ROM: Limited ROM active and passive to left foot Skin: warm and dry Neurologic: Ambulates  without difficulty; Sensation intact about the upper/ lower extremities Psychological: alert and cooperative; normal mood and affect  DIAGNOSTIC STUDIES:  DG Foot Complete Left  Result Date: 09/14/2020 CLINICAL DATA:  Pain following fall EXAM: LEFT FOOT - COMPLETE 3+ VIEW COMPARISON:  None. FINDINGS: Frontal, oblique, and lateral views were obtained. There is an obliquely oriented fracture of the mid to distal aspect of the fifth metacarpal with slight separation of fracture fragments. No other fracture. No dislocation. Joint spaces appear normal. No erosive change IMPRESSION: Fracture mid to distal aspect of the fifth metacarpal with mild separation of fracture fragments. No other fractures. No dislocation. No appreciable arthropathic change. These results will be called to the ordering clinician or representative by the Radiologist Assistant, and communication documented in the PACS or [employer]. Electronically Signed   By: Lowella Grip III M.D.   On: 09/14/2020 12:56     ASSESSMENT & PLAN:  1. Closed nondisplaced fracture of fifth metatarsal bone of left foot, initial encounter   2. Left foot pain     X-ray shows there is a fracture to mid to distal aspect of the fifth metatarsal with mild separation of fracture fragments Discussed with patient that she needs to follow-up with orthopedics very soon This fracture may require surgical repair Continue conservative management of rest, ice, and gentle stretches Take ibuprofen as needed for pain relief (may cause abdominal discomfort, ulcers, and GI bleeds avoid taking with other NSAIDs) Follow up with orthoedics Return or go to the ER if you have any new or worsening symptoms (fever, chills, chest pain, abdominal pain, changes in bowel or bladder  habits, pain radiating into lower legs)   Reviewed expectations re: course of current medical issues. Questions answered. Outlined signs and symptoms indicating need for more acute  intervention. Patient verbalized understanding. After Visit Summary given.       Faustino Congress, NP 09/14/20 1904

## 2020-09-14 NOTE — Discharge Instructions (Addendum)
X-ray shows fracture of fifth metatarsal of the left foot  Discussed with patient that she should probably be seen at [facility], states that she will go there today, discussed that the foot may need to be surgically repaired  Follow-up as needed

## 2020-09-17 DIAGNOSIS — S92352A Displaced fracture of fifth metatarsal bone, left foot, initial encounter for closed fracture: Secondary | ICD-10-CM | POA: Diagnosis not present

## 2020-10-08 DIAGNOSIS — S92352A Displaced fracture of fifth metatarsal bone, left foot, initial encounter for closed fracture: Secondary | ICD-10-CM | POA: Diagnosis not present

## 2020-10-12 ENCOUNTER — Encounter: Payer: Self-pay | Admitting: Family Medicine

## 2020-10-15 ENCOUNTER — Other Ambulatory Visit (HOSPITAL_COMMUNITY): Payer: Self-pay | Admitting: Nurse Practitioner

## 2020-10-17 ENCOUNTER — Other Ambulatory Visit (INDEPENDENT_AMBULATORY_CARE_PROVIDER_SITE_OTHER): Payer: Medicare Other

## 2020-10-17 ENCOUNTER — Other Ambulatory Visit: Payer: Self-pay

## 2020-10-17 DIAGNOSIS — D72819 Decreased white blood cell count, unspecified: Secondary | ICD-10-CM | POA: Diagnosis not present

## 2020-10-17 DIAGNOSIS — D696 Thrombocytopenia, unspecified: Secondary | ICD-10-CM | POA: Diagnosis not present

## 2020-10-17 LAB — CBC WITH DIFFERENTIAL/PLATELET
Basophils Absolute: 0 10*3/uL (ref 0.0–0.1)
Basophils Relative: 0.5 % (ref 0.0–3.0)
Eosinophils Absolute: 0.1 10*3/uL (ref 0.0–0.7)
Eosinophils Relative: 2.5 % (ref 0.0–5.0)
HCT: 44.3 % (ref 36.0–46.0)
Hemoglobin: 15 g/dL (ref 12.0–15.0)
Lymphocytes Relative: 27.4 % (ref 12.0–46.0)
Lymphs Abs: 1.1 10*3/uL (ref 0.7–4.0)
MCHC: 33.9 g/dL (ref 30.0–36.0)
MCV: 91.9 fl (ref 78.0–100.0)
Monocytes Absolute: 0.2 10*3/uL (ref 0.1–1.0)
Monocytes Relative: 4.1 % (ref 3.0–12.0)
Neutro Abs: 2.6 10*3/uL (ref 1.4–7.7)
Neutrophils Relative %: 65.5 % (ref 43.0–77.0)
Platelets: 143 10*3/uL — ABNORMAL LOW (ref 150.0–400.0)
RBC: 4.82 Mil/uL (ref 3.87–5.11)
RDW: 13.7 % (ref 11.5–15.5)
WBC: 3.9 10*3/uL — ABNORMAL LOW (ref 4.0–10.5)

## 2020-10-17 LAB — VITAMIN B12: Vitamin B-12: 1017 pg/mL — ABNORMAL HIGH (ref 211–911)

## 2020-10-17 LAB — FOLATE: Folate: >23.6 ng/mL (ref >5.9)

## 2020-10-18 ENCOUNTER — Encounter: Payer: Self-pay | Admitting: Family Medicine

## 2020-10-18 LAB — PATHOLOGIST SMEAR REVIEW

## 2020-10-20 NOTE — Telephone Encounter (Signed)
Replied via lab results.

## 2020-10-22 ENCOUNTER — Ambulatory Visit: Payer: Medicare Other | Attending: Internal Medicine

## 2020-10-22 DIAGNOSIS — Z23 Encounter for immunization: Secondary | ICD-10-CM

## 2020-10-22 NOTE — Progress Notes (Signed)
   Covid-19 Vaccination Clinic  Name:  Alexandria Moreno    MRN: [id number] DOB: [birth year]  10/22/2020  [name] was observed post Covid-19 immunization for 15 minutes without incident. She was provided with Vaccine Information Sheet and instruction to access the V-Safe system.   [name] was instructed to call 911 with any severe reactions post vaccine: Marland Kitchen Difficulty breathing  . Swelling of face and throat  . A fast heartbeat  . A bad rash all over body  . Dizziness and weakness

## 2020-11-05 ENCOUNTER — Other Ambulatory Visit: Payer: Self-pay

## 2020-11-05 ENCOUNTER — Encounter: Payer: Self-pay | Admitting: Dermatology

## 2020-11-05 ENCOUNTER — Ambulatory Visit (INDEPENDENT_AMBULATORY_CARE_PROVIDER_SITE_OTHER): Payer: Medicare Other | Admitting: Dermatology

## 2020-11-05 DIAGNOSIS — D229 Melanocytic nevi, unspecified: Secondary | ICD-10-CM | POA: Diagnosis not present

## 2020-11-05 DIAGNOSIS — L309 Dermatitis, unspecified: Secondary | ICD-10-CM | POA: Diagnosis not present

## 2020-11-05 DIAGNOSIS — C44311 Basal cell carcinoma of skin of nose: Secondary | ICD-10-CM | POA: Diagnosis not present

## 2020-11-05 DIAGNOSIS — L820 Inflamed seborrheic keratosis: Secondary | ICD-10-CM

## 2020-11-05 DIAGNOSIS — Z85828 Personal history of other malignant neoplasm of skin: Secondary | ICD-10-CM | POA: Diagnosis not present

## 2020-11-05 DIAGNOSIS — L578 Other skin changes due to chronic exposure to nonionizing radiation: Secondary | ICD-10-CM | POA: Diagnosis not present

## 2020-11-05 DIAGNOSIS — D485 Neoplasm of uncertain behavior of skin: Secondary | ICD-10-CM

## 2020-11-05 DIAGNOSIS — L821 Other seborrheic keratosis: Secondary | ICD-10-CM

## 2020-11-05 DIAGNOSIS — S92352A Displaced fracture of fifth metatarsal bone, left foot, initial encounter for closed fracture: Secondary | ICD-10-CM | POA: Diagnosis not present

## 2020-11-05 NOTE — Progress Notes (Signed)
Follow-Up Visit   Subjective  Alexandria Moreno is a 79 y.o. female who presents for the following: Follow-up.  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 She also has an irritated spot on her left jaw that has come up since last visit. She has a spot on her left nose that has bled when washing face.  She also gets a rash off and on on her R hand dorsum.  She treats it with TMC cream which helps some.   The following portions of the chart were reviewed this encounter and updated as appropriate:      Review of Systems:  No other skin or systemic complaints except as noted in HPI or Assessment and Plan.  Objective  Well appearing patient in no apparent distress; mood and affect are within normal limits.  A focused examination was performed including face. Relevant physical exam findings are noted in the Assessment and Plan.  Objective  L lower neck, L neck ant to scar: Well healed scar with no evidence of recurrence.   Objective  Right Hand 5th MCP: Pink scaly patch   Objective  Left jaw x 2 and Right lower neck x 5 (7): Erythematous keratotic or waxy stuck-on papule or plaque.   Objective  Left Nasal Ala: 6.54mm instinct pink white macule, area bleeds when washing face        Assessment & Plan   Actinic Damage - chronic, secondary to cumulative UV radiation exposure/sun exposure over time - diffuse scaly erythematous macules with underlying dyspigmentation - Recommend daily broad spectrum sunscreen SPF 30+ to sun-exposed areas, reapply every 2 hours as needed.  - Call for new or changing lesions. History of Basal Cell Carcinoma of the Skin - No evidence of recurrence today nose - Recommend regular full body skin exams - Recommend daily broad spectrum sunscreen SPF 30+ to sun-exposed areas, reapply every 2 hours as needed.  - Call if any new or changing lesions are noted between office visits  History  of Squamous Cell Carcinoma of the Skin - No evidence of recurrence today - Recommend regular full body skin exams - Recommend daily broad spectrum sunscreen SPF 30+ to sun-exposed areas, reapply every 2 hours as needed.  - Call if any new or changing lesions are noted between office visits Seborrheic Keratoses - Stuck-on, waxy, tan-brown papules and plaques  - Discussed benign etiology and prognosis. - Observe - Call for any changes  Melanocytic Nevi - Tan-brown and/or pink-flesh-colored symmetric macules and papules, including glabella - Benign appearing on exam today - Observation - Call clinic for new or changing moles - Recommend daily use of broad spectrum spf 30+ sunscreen to sun-exposed areas.    History of basal cell carcinoma (BCC) L lower neck, L neck ant to scar  Clear. Observe for recurrence. Call clinic for new or changing lesions.  Recommend regular skin exams, daily broad-spectrum spf 30+ sunscreen use, and photoprotection.     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 Avoid face, groin, axilla. Recommend [employer] Bumpy.  Discussed that this is a chronic type of dermatitis that can come and go.  While there is no cure, the rash and symptoms can be managed with topical prescription medications, and for more severe cases, with systemic medications.  Recommend mild soap and routine use of moisturizing cream after handwashing.  Minimize soap/water exposure when possible.     Inflamed seborrheic keratosis (7) Left jaw x 2 and Right lower neck x 5  Destruction of lesion - Left jaw x 2 and Right lower neck x 5  Destruction method: cryotherapy   Informed consent: discussed and consent obtained   Lesion destroyed using liquid nitrogen: Yes   Region frozen until ice ball extended beyond lesion: Yes   Outcome: patient tolerated procedure well with no complications   Post-procedure details: wound care  instructions given   Additional details:  Prior to procedure, discussed risks of blister formation, small wound, skin dyspigmentation, or rare scar following cryotherapy.  Neoplasm of uncertain behavior of skin Left Nasal Ala  Skin / nail biopsy Type of biopsy: tangential   Informed consent: discussed and consent obtained   Patient was prepped and draped in usual sterile fashion: Area prepped with alcohol. Anesthesia: the lesion was anesthetized in a standard fashion   Anesthetic:  1% lidocaine w/ epinephrine 1-100,000 buffered w/ 8.4% NaHCO3 Instrument used: DermaBlade   Hemostasis achieved with: pressure, aluminum chloride and electrodesiccation   Outcome: patient tolerated procedure well   Post-procedure details: wound care instructions given   Post-procedure details comment:  Ointment and small bandage applied  Specimen 1 - Surgical pathology Differential Diagnosis: AK r/o BCC Check Margins: No 6.28mm indistinct pink white macule  If + for [facility], will schedule MOHs with Dr Lacinda Axon.  Return in about 6 months (around 05/05/2021) for TBSE.   IJamesetta Orleans, CMA, am acting as scribe for Brendolyn Patty, MD .  Documentation: I have reviewed the above documentation for accuracy and completeness, and I agree with the above.  Brendolyn Patty MD

## 2020-11-05 NOTE — Patient Instructions (Signed)
Wound Care Instructions ° °1. Cleanse wound gently with soap and water once a day then pat dry with clean gauze. Apply a thing coat of Petrolatum (petroleum jelly, "Vaseline") over the wound (unless you have an allergy to this). We recommend that you use a new, sterile tube of Vaseline. Do not pick or remove scabs. Do not remove the yellow or white "healing tissue" from the base of the wound. ° °2. Cover the wound with fresh, clean, nonstick gauze and secure with paper tape. You may use Band-Aids in place of gauze and tape if the would is small enough, but would recommend trimming much of the tape off as there is often too much. Sometimes Band-Aids can irritate the skin. ° °3. You should call the office for your biopsy report after 1 week if you have not already been contacted. ° °4. If you experience any problems, such as abnormal amounts of bleeding, swelling, significant bruising, significant pain, or evidence of infection, please call the office immediately. ° °Cryotherapy Aftercare ° °• Wash gently with soap and water everyday.   °• Apply Vaseline and Band-Aid daily until healed. ° ° °

## 2020-11-12 ENCOUNTER — Other Ambulatory Visit: Payer: Self-pay

## 2020-11-12 ENCOUNTER — Telehealth: Payer: Self-pay

## 2020-11-12 ENCOUNTER — Encounter: Payer: Self-pay | Admitting: Cardiology

## 2020-11-12 ENCOUNTER — Ambulatory Visit (INDEPENDENT_AMBULATORY_CARE_PROVIDER_SITE_OTHER): Payer: Medicare Other | Admitting: Cardiology

## 2020-11-12 VITALS — BP 130/60 | HR 60 | Ht 64.0 in | Wt 155.0 lb

## 2020-11-12 DIAGNOSIS — R0609 Other forms of dyspnea: Secondary | ICD-10-CM

## 2020-11-12 DIAGNOSIS — I480 Paroxysmal atrial fibrillation: Secondary | ICD-10-CM | POA: Diagnosis not present

## 2020-11-12 DIAGNOSIS — R0600 Dyspnea, unspecified: Secondary | ICD-10-CM

## 2020-11-12 DIAGNOSIS — E7800 Pure hypercholesterolemia, unspecified: Secondary | ICD-10-CM

## 2020-11-12 DIAGNOSIS — C44311 Basal cell carcinoma of skin of nose: Secondary | ICD-10-CM

## 2020-11-12 NOTE — Telephone Encounter (Signed)
Advised pt  Referral was put in for Dr. Baruch Gouty.  Advised pt that his office will be calling to set up that appointment./sh

## 2020-11-12 NOTE — Telephone Encounter (Signed)
-----   Message from Brendolyn Patty, MD sent at 11/06/2020  7:28 PM EST ----- Would also consider radiation to treat BCC.  Reviewed bx result with patient and discussed with patient Mohs vrs radiation.  I would recommend radiation in this location, and she agrees that is how she wishes to proceed.  Do not refer for Mohs, instead refer for radiation with Dr. Baruch Gouty at [facility] L nasal ala.

## 2020-11-12 NOTE — Patient Instructions (Signed)
Medication Instructions:  Your physician recommends that you continue on your current medications as directed. Please refer to the Current Medication list given to you today.  *If you need a refill on your cardiac medications before your next appointment, please call your pharmacy*   Lab Work: None Ordered If you have labs (blood work) drawn today and your tests are completely normal, you will receive your results only by: Marland Kitchen MyChart Message (if you have MyChart) OR . A paper copy in the mail If you have any lab test that is abnormal or we need to change your treatment, we will call you to review the results.   Testing/Procedures:  Your physician has requested that you have an echocardiogram. Echocardiography is a painless test that uses sound waves to create images of your heart. It provides your doctor with information about the size and shape of your heart and how well your heart's chambers and valves are working. This procedure takes approximately one hour. There are no restrictions for this procedure.     Follow-Up: At [facility], you and your health needs are our priority.  As part of our continuing mission to provide you with exceptional heart care, we have created designated Provider Care Teams.  These Care Teams include your primary Cardiologist (physician) and Advanced Practice Providers (APPs -  Physician Assistants and Nurse Practitioners) who all work together to provide you with the care you need, when you need it.  We recommend signing up for the patient portal called "MyChart".  Sign up information is provided on this After Visit Summary.  MyChart is used to connect with patients for Virtual Visits (Telemedicine).  Patients are able to view lab/test results, encounter notes, upcoming appointments, etc.  Non-urgent messages can be sent to your provider as well.   To learn more about what you can do with MyChart, go to [email or web address].    Your next appointment:    Follow up after Echo   The format for your next appointment:   In Person  Provider:   Kate Sable, MD   Other Instructions

## 2020-11-12 NOTE — Progress Notes (Signed)
Referral to Dr. Baruch Gouty for radiation of [facility] of L nasal ala/sh

## 2020-11-12 NOTE — Progress Notes (Signed)
Cardiology Office Note:    Date:  11/12/2020   ID:  Alexandria Moreno, DOB Jul 23, 1941, MRN [id number]  PCP:  Ria Bush, MD  Cardiologist:  Kate Sable, MD  Electrophysiologist:  None   Referring MD: Ria Bush, MD   Chief Complaint  Patient presents with  . Follow-up    6 month/ Meds reviewed by the pt. verbally/ pt.c/o shortness of breath, chest pain that radiates to her left arm/shoulder and neck at times with occas. fluttering in chest.    History of Present Illness:    Alexandria Moreno is a 79 y.o. female with a hx of paroxysmal atrial fibrillation, hyperlipidemia who presents for follow-up.    Patient being seen for atrial fibrillation on Multaq, Eliquis, Toprol-XL.  She tolerates her medications without any adverse effects.  Has occasional fluttering in her chest, last significant palpitations about 3 months ago, lasting a couple of minutes.  Was playing with grandkids couple of weeks ago, slipped and broke her left ankle.   Prior notes She did establish care with the A. fib clinic where Multaq was started. echocardiogram 09/2019 showed normal systolic and diastolic function, Lexiscan Myoview 10/2019 with no evidence for ischemia.     Past Medical History:  Diagnosis Date  . Basal cell carcinoma 05/22/2015   R nasal ala  . Basal cell carcinoma 07/21/2017   L upper arm, L neck, L medial shoulder  . Basal cell carcinoma 09/21/2018   R shoulder  . Basal cell carcinoma 05/01/2020   L lower neck   . Basal cell carcinoma 05/01/2020   L neck ant to scar   . Basal cell carcinoma 11/05/2020   L nasal ala, refer for Radiation  . Diverticulosis   . DJD (degenerative joint disease) of knee   . GERD (gastroesophageal reflux disease)   . H/o Iron deficiency anemia 2009   a. 05/2008 EGD/Colonoscopy: nl w/o evidence of bleeding; b. 11/2017 EGD: nl. Colonoscopy: 75mm cecal polyp, otw nl.  . H/O transfusion of whole blood 2009  . H/O: rheumatic fever    79 years old    . History of chicken pox   . History of echocardiogram    a. 09/2019 Echo: EF 60-65%, nl RV size/fxn. Nl Atrial sizes. Triv TR. Mild AI.  Marland Kitchen Hx of basal cell carcinoma 2010   L dorsum tip of nose (MOHS), R ant alar groove (MOHS)  . 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 Hyperlipidemia   . Metatarsal fracture 09/2020   L 5th shaft s/p conservative management [facility] Heck)  . Migraines   . PAF (paroxysmal atrial fibrillation) ([place])    a. 08/2019 Zio: Avg HR 60 (min 43, max 144). PAF (2% burden), longest 1h 48m (avg rate 77). Rare PAC's and PVC's.  . Palpitation   . Pelvic prolapse    with rectocele  . Rosacea   . Seasonal allergic rhinitis    mild  . 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 Stress incontinence     Past Surgical History:  Procedure Laterality Date  . ABDOMINAL HYSTERECTOMY  1981  . APPENDECTOMY  1960  . capsule endoscopy  2009   WNL  . CATARACT EXTRACTION Bilateral [id number]  . COLONOSCOPY  2009   diverticulosis ([place])  . COLONOSCOPY WITH PROPOFOL N/A 12/01/2017   TA, rpt 5 yrs ([place])  . DEXA  06/2016   T -0.2 hip, 0.7 spine  . ESOPHAGOGASTRODUODENOSCOPY  2009  focal mild chronic gastritis, neg H pylori ([place])  . ESOPHAGOGASTRODUODENOSCOPY (EGD) WITH PROPOFOL N/A 12/01/2017   Procedure: ESOPHAGOGASTRODUODENOSCOPY (EGD) WITH PROPOFOL;  Surgeon: Lucilla Lame, MD;  Location: [facility] ENDOSCOPY;  Service: Endoscopy;  Laterality: N/A;  . INCONTINENCE SURGERY  2001   with mesh  . LIPOMA EXCISION  1971   right shoulder  . [place]  . OVARIAN CYST REMOVAL  1960  . SALPINGOOPHORECTOMY Left 2001  . TONSILLECTOMY  1958    Current Medications: Current Meds  Medication Sig  . apixaban (ELIQUIS) 5 MG TABS tablet Take 1 tablet (5 mg total) by mouth 2 (two) times daily.  Marland Kitchen atorvastatin (LIPITOR) 40 MG tablet Take 1 tablet (40 mg total) by mouth daily.  . B COMPLEX VITAMINS PO Take by mouth daily.   . Calcium Citrate (CITRACAL PO) Take 1 tablet by mouth at bedtime.  . chlorhexidine (PERIDEX) 0.12 % solution as needed.   . cycloSPORINE (RESTASIS) 0.05 % ophthalmic emulsion Apply 0.05 drops to eye 2 (two) times daily.  . diphenoxylate-atropine (LOMOTIL) 2.5-0.025 MG tablet Take 1 tablet by mouth 4 (four) times daily.  Marland Kitchen glucosamine-chondroitin 500-400 MG tablet Take 1 tablet by mouth 2 (two) times daily.  . Methylcellulose, Laxative, (FIBER THERAPY PO) Take 3 tablets by mouth daily.  . metoprolol succinate (TOPROL-XL) 25 MG 24 hr tablet TAKE 1 TABLET DAILY (Patient taking differently: Take 12.5 mg by mouth daily. )  . MULTAQ 400 MG tablet TAKE 1 TABLET TWICE A DAY WITH MEALS  . MULTIPLE VITAMIN PO Take by mouth daily.  Marland Kitchen omeprazole (PRILOSEC) 40 MG capsule Take 1 capsule (40 mg total) by mouth 2 (two) times daily.  . rizatriptan (MAXALT) 10 MG tablet Take 10 mg by mouth daily as needed.  . topiramate (TOPAMAX) 50 MG tablet Take 1 tablet (50 mg total) by mouth at bedtime.     Allergies:   Aspirin, Baclofen, Codeine, Imitrex  [sumatriptan], Influenza vaccines, and Vicodin  [hydrocodone-acetaminophen]   Social History   Socioeconomic History  . Marital status: Married    Spouse name: Not on file  . Number of children: Not on file  . Years of education: Not on file  . Highest education level: Not on file  Occupational History  . Not on file  Tobacco Use  . Smoking status: Former Smoker    Packs/day: 0.50    Years: 20.00    Pack years: 10.00    Types: Cigarettes    Quit date: 12/15/1978    Years since quitting: 41.9  . Smokeless tobacco: Never Used  Vaping Use  . Vaping Use: Never used  Substance and Sexual Activity  . Alcohol use: No    Alcohol/week: 0.0 standard drinks  . Drug use: No  . Sexual activity: Never  Other Topics Concern  . Not on file  Social History Narrative   Lives with husband    Occupation: retired, was Art therapist   Edu: some college   Activity:  stationary bicycle   Diet: good water, following slim fast diet, good vegetables   Social Determinants of Health   Financial Resource Strain: Low Risk   . Difficulty of Paying Living Expenses: Not hard at all  Food Insecurity: No Food Insecurity  . Worried About Charity fundraiser in the Last Year: Never true  . Ran Out of Food in the Last Year: Never true  Transportation Needs: No Transportation Needs  . Lack of Transportation (Medical): No  .  Lack of Transportation (Non-Medical): No  Physical Activity: Insufficiently Active  . Days of Exercise per Week: 1 day  . Minutes of Exercise per Session: 60 min  Stress: No Stress Concern Present  . Feeling of Stress : Not at all  Social Connections:   . Frequency of Communication with Friends and Family: Not on file  . Frequency of Social Gatherings with Friends and Family: Not on file  . Attends Religious Services: Not on file  . Active Member of Clubs or Organizations: Not on file  . Attends Archivist Meetings: Not on file  . Marital Status: Not on file     Family History: The patient's family history includes Alcohol abuse in her father; Alzheimer's disease in her maternal grandfather; Cancer in her mother; Dementia in her mother; Depression in her mother; Heart attack in her brother, brother, and father; Heart disease in her father; Hypertension in her brother; Osteoporosis in her father; Stroke in her brother and paternal grandfather; Stroke (age of onset: 35) in her mother; Stroke (age of onset: 36) in her brother. There is no history of Breast cancer.  ROS:   Please see the history of present illness.     All other systems reviewed and are negative.  EKGs/Labs/Other Studies Reviewed:    The following studies were reviewed today:  Lexiscan myocardial perfusion imaging stress test date 11/03/2019 Pharmacological myocardial perfusion imaging study with no significant  ischemia Normal wall motion, EF estimated at  53% No EKG changes concerning for ischemia at peak stress or in recovery. CT attenuation corrected images with mild thoracic descending aorta atherosclerosis, no significant coronary calcification noted Resting blood pressure 160 over 70s, remained 659 systolic following the test Low risk scan TTE 09/21/2019  1. Left ventricular ejection fraction, by visual estimation, is 60 to 65%. The left ventricle has normal function. Normal left ventricular size. There is no left ventricular hypertrophy.  2. Global right ventricle has normal systolic function.The right ventricular size is normal. Right vetricular wall thickness was not assessed.  3. Left atrial size was normal.  4. Right atrial size was normal.  5. The mitral valve is normal in structure. No evidence of mitral valve regurgitation.  6. The tricuspid valve is normal in structure. Tricuspid valve regurgitation is trivial.  7. The aortic valve is tricuspid Aortic valve regurgitation is mild by color flow Doppler.  8. The pulmonic valve was normal in structure. Pulmonic valve regurgitation is not visualized by color flow Doppler.   2-week cardiac monitor date 08/29/2019 Patient had a min HR of 43 bpm, max HR of 144 bpm, and avg HR of 60 bpm. Predominant underlying rhythm was Sinus Rhythm. First Degree AV Block was present. Atrial Fibrillation occurred (2% burden), ranging from 45-144 bpm (avg of 79 bpm), the longest lasting 1 hour 23 mins with an avg rate of 77 bpm. Atrial Fibrillation was detected within +/- 45 seconds of symptomatic patient event(s). Isolated SVEs were rare (<1.0%), SVE Couplets were rare (<1.0%), and SVE Triplets were rare (<1.0%). Isolated VEs were rare (<1.0%), VE Couplets were rare (<1.0%), and no VE Triplets were present.  EKG:  EKG is  ordered today.  The ekg ordered today demonstrates normal sinus rhythm,   Recent Labs: 08/03/2020: ALT 22; BUN 15; Creatinine, Ser 0.89; Potassium 4.1; Sodium 142; TSH  5.29 10/17/2020: Hemoglobin 15.0; Platelets 143.0  Recent Lipid Panel    Component Value Date/Time   CHOL 141 08/03/2020 0848   CHOL 144 11/19/2015 1119  TRIG 194.0 (H) 08/03/2020 0848   HDL 39.90 08/03/2020 0848   HDL 42 11/19/2015 1119   CHOLHDL 4 08/03/2020 0848   VLDL 38.8 08/03/2020 0848   LDLCALC 62 08/03/2020 0848   LDLCALC 71 11/19/2015 1119    Physical Exam:    VS:  BP 130/60 (BP Location: Left Arm, Patient Position: Sitting, Cuff Size: Normal)   Pulse 60   Ht 5\' 4"  (1.626 m)   Wt 155 lb (70.3 kg)   SpO2 98%   BMI 26.61 kg/m     Wt Readings from Last 3 Encounters:  11/12/20 155 lb (70.3 kg)  08/07/20 154 lb 8 oz (70.1 kg)  07/27/20 154 lb (69.9 kg)     GEN:  Well nourished, well developed in no acute distress HEENT: Normal NECK: No JVD; No carotid bruits LYMPHATICS: No lymphadenopathy CARDIAC: RRR, no murmurs, rubs, gallops RESPIRATORY:  Clear to auscultation without rales, wheezing or rhonchi  ABDOMEN: Soft, non-tender, non-distended MUSCULOSKELETAL:  No edema; left ankle cast boot noted SKIN: Warm and dry NEUROLOGIC:  Alert and oriented x 3 PSYCHIATRIC:  Normal affect   ASSESSMENT:    1. Paroxysmal atrial fibrillation (HCC)   2. Pure hypercholesterolemia   3. Dyspnea on exertion    PLAN:    In order of problems listed above:  1. paroxysmal atrial fibrillation, currently in sinus rhythm.  Occasional palpitations. CHA2DS2-VASc of 3, continue Multaq, Eliquis, Toprol 12.5 mg daily.  Patient has an appointment with A. fib clinic in 2 months.  2.  hyperlipidemia, continue statin as prescribed.  3.  Dyspnea on exertion, last echocardiogram was normal.  Repeat echo to evaluate any changes in function.  May consider additional testing if symptoms of dyspnea persist.  Follow up in 4 months  Medication Adjustments/Labs and Tests Ordered: Current medicines are reviewed at length with the patient today.  Concerns regarding medicines are outlined above.   Orders Placed This Encounter  Procedures  . EKG 12-Lead  . ECHOCARDIOGRAM COMPLETE   No orders of the defined types were placed in this encounter.   Patient Instructions  Medication Instructions:  Your physician recommends that you continue on your current medications as directed. Please refer to the Current Medication list given to you today.  *If you need a refill on your cardiac medications before your next appointment, please call your pharmacy*   Lab Work: None Ordered If you have labs (blood work) drawn today and your tests are completely normal, you will receive your results only by: Marland Kitchen MyChart Message (if you have MyChart) OR . A paper copy in the mail If you have any lab test that is abnormal or we need to change your treatment, we will call you to review the results.   Testing/Procedures:  Your physician has requested that you have an echocardiogram. Echocardiography is a painless test that uses sound waves to create images of your heart. It provides your doctor with information about the size and shape of your heart and how well your heart's chambers and valves are working. This procedure takes approximately one hour. There are no restrictions for this procedure.     Follow-Up: At [facility], you and your health needs are our priority.  As part of our continuing mission to provide you with exceptional heart care, we have created designated Provider Care Teams.  These Care Teams include your primary Cardiologist (physician) and Advanced Practice Providers (APPs -  Physician Assistants and Nurse Practitioners) who all work together to provide you with  the care you need, when you need it.  We recommend signing up for the patient portal called "MyChart".  Sign up information is provided on this After Visit Summary.  MyChart is used to connect with patients for Virtual Visits (Telemedicine).  Patients are able to view lab/test results, encounter notes, upcoming  appointments, etc.  Non-urgent messages can be sent to your provider as well.   To learn more about what you can do with MyChart, go to [email or web address].    Your next appointment:   Follow up after Echo   The format for your next appointment:   In Person  Provider:   Kate Sable, MD   Other Instructions      Signed, Kate Sable, MD  11/12/2020 12:31 PM    [place]

## 2020-11-19 ENCOUNTER — Other Ambulatory Visit: Payer: Self-pay

## 2020-11-19 ENCOUNTER — Ambulatory Visit
Admission: RE | Admit: 2020-11-19 | Discharge: 2020-11-19 | Disposition: A | Discharge status: Home / Self Care | Payer: Medicare Other | Source: Ambulatory Visit | Attending: Radiation Oncology | Admitting: Radiation Oncology

## 2020-11-19 ENCOUNTER — Other Ambulatory Visit: Payer: Self-pay | Admitting: Family Medicine

## 2020-11-19 ENCOUNTER — Encounter: Payer: Self-pay | Admitting: Radiation Oncology

## 2020-11-19 DIAGNOSIS — E785 Hyperlipidemia, unspecified: Secondary | ICD-10-CM | POA: Insufficient documentation

## 2020-11-19 DIAGNOSIS — Z79899 Other long term (current) drug therapy: Secondary | ICD-10-CM | POA: Diagnosis not present

## 2020-11-19 DIAGNOSIS — Z7901 Long term (current) use of anticoagulants: Secondary | ICD-10-CM | POA: Diagnosis not present

## 2020-11-19 DIAGNOSIS — K219 Gastro-esophageal reflux disease without esophagitis: Secondary | ICD-10-CM | POA: Diagnosis not present

## 2020-11-19 DIAGNOSIS — G43909 Migraine, unspecified, not intractable, without status migrainosus: Secondary | ICD-10-CM | POA: Diagnosis not present

## 2020-11-19 DIAGNOSIS — M1990 Unspecified osteoarthritis, unspecified site: Secondary | ICD-10-CM | POA: Diagnosis not present

## 2020-11-19 DIAGNOSIS — Z809 Family history of malignant neoplasm, unspecified: Secondary | ICD-10-CM | POA: Diagnosis not present

## 2020-11-19 DIAGNOSIS — D509 Iron deficiency anemia, unspecified: Secondary | ICD-10-CM | POA: Diagnosis not present

## 2020-11-19 DIAGNOSIS — C44311 Basal cell carcinoma of skin of nose: Secondary | ICD-10-CM

## 2020-11-19 DIAGNOSIS — Z87891 Personal history of nicotine dependence: Secondary | ICD-10-CM | POA: Insufficient documentation

## 2020-11-19 DIAGNOSIS — N393 Stress incontinence (female) (male): Secondary | ICD-10-CM | POA: Diagnosis not present

## 2020-11-19 NOTE — Consult Note (Signed)
NEW PATIENT EVALUATION  Name: Alexandria Moreno  MRN: [id number]  Date:   11/19/2020     DOB: 15-Aug-1941   This 79 y.o. 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  REFERRING PHYSICIAN: Ria Bush, MD  CHIEF COMPLAINT:  Chief Complaint  Patient presents with  . Cancer    BCC of nose    DIAGNOSIS: The encounter diagnosis was Basal cell carcinoma of nose.   PREVIOUS INVESTIGATIONS:  Pathology report reviewed Clinical notes reviewed CT scan of the head and neck reviewed  HPI: Patient is a 79 year old female with history of basal cell carcinoma of the nose has remotely had resection in that area.  She recently had a new lesion in the left nasal alla which was biopsied by Dr. Nicole Kindred showing superficial and nodular basal cell carcinoma.  Treatment options were discussed she is opted for electron-beam therapy and is now referred to [facility] for consultation.  She is asymptomatic.  She did have a CT scan of her head and neck back in September for a palpable nodule in the right lateral neck which was seen as an 8 x 3 mm nodule that is very like a lymph node nonworrisome in isolation.  PLANNED TREATMENT REGIMEN: Electron beam therapy  PAST MEDICAL HISTORY:  has a past medical history of Basal cell carcinoma (05/22/2015), Basal cell carcinoma (07/21/2017), Basal cell carcinoma (09/21/2018), Basal cell carcinoma (05/01/2020), Basal cell carcinoma (05/01/2020), Basal cell carcinoma (11/05/2020), Diverticulosis, DJD (degenerative joint disease) of knee, GERD (gastroesophageal reflux disease), H/o Iron deficiency anemia (2009), H/O transfusion of whole blood (2009), H/O: rheumatic fever, History of chicken pox, History of echocardiogram, basal cell carcinoma (2010), squamous cell carcinoma (11/01/2018), Hyperlipidemia, Metatarsal fracture (09/2020), Migraines, PAF (paroxysmal atrial fibrillation) (HCC),  Palpitation, Pelvic prolapse, Rosacea, Seasonal allergic rhinitis, 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    PAST SURGICAL HISTORY:  Past Surgical History:  Procedure Laterality Date  . ABDOMINAL HYSTERECTOMY  1981  . APPENDECTOMY  1960  . capsule endoscopy  2009   WNL  . CATARACT EXTRACTION Bilateral [id number]  . COLONOSCOPY  2009   diverticulosis ([place])  . COLONOSCOPY WITH PROPOFOL N/A 12/01/2017   TA, rpt 5 yrs ([facility])  . DEXA  06/2016   T -0.2 hip, 0.7 spine  . ESOPHAGOGASTRODUODENOSCOPY  2009   focal mild chronic gastritis, neg H pylori ([place])  . ESOPHAGOGASTRODUODENOSCOPY (EGD) WITH PROPOFOL N/A 12/01/2017   Procedure: ESOPHAGOGASTRODUODENOSCOPY (EGD) WITH PROPOFOL;  Surgeon: Lucilla Lame, MD;  Location: [facility] ENDOSCOPY;  Service: Endoscopy;  Laterality: N/A;  . INCONTINENCE SURGERY  2001   with mesh  . LIPOMA EXCISION  1971   right shoulder  . [place]  . OVARIAN CYST REMOVAL  1960  . SALPINGOOPHORECTOMY Left 2001  . TONSILLECTOMY  1958    FAMILY HISTORY: family history includes Alcohol abuse in her father; Alzheimer's disease in her maternal grandfather; Cancer in her mother; Dementia in her mother; Depression in her mother; Heart attack in her brother, brother, and father; Heart disease in her father; Hypertension in her brother; Osteoporosis in her father; Stroke in her brother and paternal grandfather; Stroke (age of onset: 27) in her mother; Stroke (age of onset: 62) in her brother.  SOCIAL HISTORY:  reports that she quit smoking about 41 years ago. Her smoking use included cigarettes. She has a 10.00 pack-year smoking history. She has never used smokeless tobacco.  She reports that she does not drink alcohol and does not use drugs.  ALLERGIES: Aspirin, Baclofen, Codeine, Imitrex  [sumatriptan], Influenza vaccines, and Vicodin  [hydrocodone-acetaminophen]  MEDICATIONS:  Current Outpatient Medications  Medication Sig  Dispense Refill  . apixaban (ELIQUIS) 5 MG TABS tablet Take 1 tablet (5 mg total) by mouth 2 (two) times daily. 180 tablet 1  . atorvastatin (LIPITOR) 40 MG tablet Take 1 tablet (40 mg total) by mouth daily. 90 tablet 3  . B COMPLEX VITAMINS PO Take by mouth daily.    . Calcium Citrate (CITRACAL PO) Take 1 tablet by mouth at bedtime.    . chlorhexidine (PERIDEX) 0.12 % solution as needed.   0  . cycloSPORINE (RESTASIS) 0.05 % ophthalmic emulsion Apply 0.05 drops to eye 2 (two) times daily.    . diphenoxylate-atropine (LOMOTIL) 2.5-0.025 MG tablet Take 1 tablet by mouth 4 (four) times daily. 360 tablet 3  . glucosamine-chondroitin 500-400 MG tablet Take 1 tablet by mouth 2 (two) times daily.    . Methylcellulose, Laxative, (FIBER THERAPY PO) Take 3 tablets by mouth daily.    . metoprolol succinate (TOPROL-XL) 25 MG 24 hr tablet TAKE 1 TABLET DAILY (Patient taking differently: Take 12.5 mg by mouth daily. ) 90 tablet 0  . MULTAQ 400 MG tablet TAKE 1 TABLET TWICE A DAY WITH MEALS 180 tablet 0  . MULTIPLE VITAMIN PO Take by mouth daily.    Marland Kitchen omeprazole (PRILOSEC) 40 MG capsule Take 1 capsule (40 mg total) by mouth 2 (two) times daily. 180 capsule 3  . rizatriptan (MAXALT) 10 MG tablet Take 10 mg by mouth daily as needed.    . topiramate (TOPAMAX) 50 MG tablet Take 1 tablet (50 mg total) by mouth at bedtime.     No current facility-administered medications for this encounter.    ECOG PERFORMANCE STATUS:  0 - Asymptomatic  REVIEW OF SYSTEMS: Patient denies any weight loss, fatigue, weakness, fever, chills or night sweats. Patient denies any loss of vision, blurred vision. Patient denies any ringing  of the ears or hearing loss. No irregular heartbeat. Patient denies heart murmur or history of fainting. Patient denies any chest pain or pain radiating to her upper extremities. Patient denies any shortness of breath, difficulty breathing at night, cough or hemoptysis. Patient denies any swelling in the  lower legs. Patient denies any nausea vomiting, vomiting of blood, or coffee ground material in the vomitus. Patient denies any stomach pain. Patient states has had normal bowel movements no significant constipation or diarrhea. Patient denies any dysuria, hematuria or significant nocturia. Patient denies any problems walking, swelling in the joints or loss of balance. Patient denies any skin changes, loss of hair or loss of weight. Patient denies any excessive worrying or anxiety or significant depression. Patient denies any problems with insomnia. Patient denies excessive thirst, polyuria, polydipsia. Patient denies any swollen glands, patient denies easy bruising or easy bleeding. Patient denies any recent infections, allergies or URI. Patient "s visual fields have not changed significantly in recent time.   PHYSICAL EXAM: BP (!) (P) 126/55 (BP Location: Left Arm, Patient Position: Sitting)   Pulse (P) 66   Temp (P) 97.8 F (36.6 C) (Tympanic)   Resp (P) 16   Wt (P) 157 lb 8 oz (71.4 kg)   BMI (P) 27.03 kg/m  Small ulcerated lesion on the left nasal alla.  No evidence of submental cervical or supraclavicular adenopathy.  Well-developed well-nourished patient in NAD. HEENT reveals PERLA, EOMI, discs not  visualized.  Oral cavity is clear. No oral mucosal lesions are identified. Neck is clear without evidence of cervical or supraclavicular adenopathy. Lungs are clear to A&P. Cardiac examination is essentially unremarkable with regular rate and rhythm without murmur rub or thrill. Abdomen is benign with no organomegaly or masses noted. Motor sensory and DTR levels are equal and symmetric in the upper and lower extremities. Cranial nerves II through XII are grossly intact. Proprioception is intact. No peripheral adenopathy or edema is identified. No motor or sensory levels are noted. Crude visual fields are within normal range.  LABORATORY DATA: Pathology report reviewed    RADIOLOGY RESULTS: CT scan  of the head and neck reviewed compatible with above-stated findings   IMPRESSION: Superficial nodular basal cell carcinoma of the left nasal alla in 79 year old female with previous surgery to her nose for skin cancer.  PLAN: This time elected ahead with electron-beam therapy to the left nasal alla on [place] most of the nose and treat up to 50 Gray over 5 weeks.  Risks and benefits of treatment clinic possible fatigue possible hyperpigmentation of the skin and skin reaction all were described in detail to the patient.  I have personally set up and ordered CT simulation for later this week.  Patient comprehends my recommendations well.  I would like to take this opportunity to thank you for allowing me to participate in the care of your patient.Noreene Filbert, MD

## 2020-11-20 NOTE — Telephone Encounter (Signed)
Pharmacy requests refill on: Metoprolol Succinate ER 25 mg   LAST REFILL: 09/08/2020 (Q-90, R-0) LAST OV: 08/07/2020 NEXT OV: Not Scheduled  PHARMACY: [place], [place]

## 2020-11-21 ENCOUNTER — Ambulatory Visit
Admission: RE | Admit: 2020-11-21 | Discharge: 2020-11-21 | Disposition: A | Discharge status: Home / Self Care | Payer: Medicare Other | Source: Ambulatory Visit | Attending: Radiation Oncology | Admitting: Radiation Oncology

## 2020-11-21 DIAGNOSIS — C44311 Basal cell carcinoma of skin of nose: Secondary | ICD-10-CM | POA: Diagnosis not present

## 2020-11-21 DIAGNOSIS — Z510 Encounter for antineoplastic radiation therapy: Secondary | ICD-10-CM | POA: Insufficient documentation

## 2020-11-26 DIAGNOSIS — C44311 Basal cell carcinoma of skin of nose: Secondary | ICD-10-CM | POA: Diagnosis not present

## 2020-11-26 DIAGNOSIS — Z510 Encounter for antineoplastic radiation therapy: Secondary | ICD-10-CM | POA: Diagnosis not present

## 2020-11-27 DIAGNOSIS — C44311 Basal cell carcinoma of skin of nose: Secondary | ICD-10-CM | POA: Diagnosis not present

## 2020-11-27 DIAGNOSIS — Z510 Encounter for antineoplastic radiation therapy: Secondary | ICD-10-CM | POA: Diagnosis not present

## 2020-11-28 ENCOUNTER — Ambulatory Visit
Admission: RE | Admit: 2020-11-28 | Discharge: 2020-11-28 | Disposition: A | Discharge status: Home / Self Care | Payer: Medicare Other | Source: Ambulatory Visit | Attending: Radiation Oncology | Admitting: Radiation Oncology

## 2020-11-28 DIAGNOSIS — C44311 Basal cell carcinoma of skin of nose: Secondary | ICD-10-CM | POA: Diagnosis not present

## 2020-11-28 DIAGNOSIS — Z510 Encounter for antineoplastic radiation therapy: Secondary | ICD-10-CM | POA: Diagnosis not present

## 2020-11-29 ENCOUNTER — Ambulatory Visit
Admission: RE | Admit: 2020-11-29 | Discharge: 2020-11-29 | Disposition: A | Discharge status: Home / Self Care | Payer: Medicare Other | Source: Ambulatory Visit | Attending: Radiation Oncology | Admitting: Radiation Oncology

## 2020-11-29 DIAGNOSIS — Z510 Encounter for antineoplastic radiation therapy: Secondary | ICD-10-CM | POA: Diagnosis not present

## 2020-11-29 DIAGNOSIS — C44311 Basal cell carcinoma of skin of nose: Secondary | ICD-10-CM | POA: Diagnosis not present

## 2020-11-30 ENCOUNTER — Other Ambulatory Visit: Payer: Self-pay

## 2020-11-30 ENCOUNTER — Ambulatory Visit
Admission: RE | Admit: 2020-11-30 | Discharge: 2020-11-30 | Disposition: A | Discharge status: Home / Self Care | Payer: Medicare Other | Source: Ambulatory Visit | Attending: Radiation Oncology | Admitting: Radiation Oncology

## 2020-11-30 ENCOUNTER — Ambulatory Visit (INDEPENDENT_AMBULATORY_CARE_PROVIDER_SITE_OTHER): Payer: Medicare Other

## 2020-11-30 DIAGNOSIS — C44311 Basal cell carcinoma of skin of nose: Secondary | ICD-10-CM | POA: Diagnosis not present

## 2020-11-30 DIAGNOSIS — Z510 Encounter for antineoplastic radiation therapy: Secondary | ICD-10-CM | POA: Diagnosis not present

## 2020-11-30 DIAGNOSIS — R0609 Other forms of dyspnea: Secondary | ICD-10-CM

## 2020-11-30 DIAGNOSIS — I480 Paroxysmal atrial fibrillation: Secondary | ICD-10-CM | POA: Diagnosis not present

## 2020-11-30 DIAGNOSIS — R0600 Dyspnea, unspecified: Secondary | ICD-10-CM

## 2020-11-30 LAB — ECHOCARDIOGRAM COMPLETE
AR max vel: 2.97 cm2
AV Area VTI: 2.48 cm2
AV Area mean vel: 2.42 cm2
AV Mean grad: 3 mmHg
AV Peak grad: 4.8 mmHg
Ao pk vel: 1.09 m/s
Area-P 1/2: 3.93 cm2
Calc EF: 54.9 %
P 1/2 time: 718 msec
S' Lateral: 2.7 cm
Single Plane A2C EF: 52.1 %
Single Plane A4C EF: 59.1 %

## 2020-12-03 ENCOUNTER — Ambulatory Visit
Admission: RE | Admit: 2020-12-03 | Discharge: 2020-12-03 | Disposition: A | Discharge status: Home / Self Care | Payer: Medicare Other | Source: Ambulatory Visit | Attending: Radiation Oncology | Admitting: Radiation Oncology

## 2020-12-03 DIAGNOSIS — C44311 Basal cell carcinoma of skin of nose: Secondary | ICD-10-CM | POA: Diagnosis not present

## 2020-12-03 DIAGNOSIS — Z510 Encounter for antineoplastic radiation therapy: Secondary | ICD-10-CM | POA: Diagnosis not present

## 2020-12-04 ENCOUNTER — Telehealth: Payer: Self-pay | Admitting: Cardiology

## 2020-12-04 ENCOUNTER — Ambulatory Visit
Admission: RE | Admit: 2020-12-04 | Discharge: 2020-12-04 | Disposition: A | Discharge status: Home / Self Care | Payer: Medicare Other | Source: Ambulatory Visit | Attending: Radiation Oncology | Admitting: Radiation Oncology

## 2020-12-04 DIAGNOSIS — C44311 Basal cell carcinoma of skin of nose: Secondary | ICD-10-CM | POA: Diagnosis not present

## 2020-12-04 DIAGNOSIS — Z510 Encounter for antineoplastic radiation therapy: Secondary | ICD-10-CM | POA: Diagnosis not present

## 2020-12-04 NOTE — Telephone Encounter (Signed)
Attempted to call the patient. No answer- I left a detailed message of results on her voice mail (ok per DPR).  I asked that she call back with any further questions/ concerns.

## 2020-12-04 NOTE — Telephone Encounter (Signed)
Kate Sable, MD  12/03/2020 1:00 PM EST      Normal systolic and diastolic function, mild aortic regurgitation. Mild MR. Overall okay echocardiogram with no significant abnormalities.

## 2020-12-05 ENCOUNTER — Ambulatory Visit
Admission: RE | Admit: 2020-12-05 | Discharge: 2020-12-05 | Disposition: A | Discharge status: Home / Self Care | Payer: Medicare Other | Source: Ambulatory Visit | Attending: Radiation Oncology | Admitting: Radiation Oncology

## 2020-12-05 DIAGNOSIS — Z510 Encounter for antineoplastic radiation therapy: Secondary | ICD-10-CM | POA: Diagnosis not present

## 2020-12-05 DIAGNOSIS — C44311 Basal cell carcinoma of skin of nose: Secondary | ICD-10-CM | POA: Diagnosis not present

## 2020-12-05 DIAGNOSIS — Z87891 Personal history of nicotine dependence: Secondary | ICD-10-CM | POA: Diagnosis not present

## 2020-12-06 ENCOUNTER — Ambulatory Visit
Admission: RE | Admit: 2020-12-06 | Discharge: 2020-12-06 | Disposition: A | Discharge status: Home / Self Care | Payer: Medicare Other | Source: Ambulatory Visit | Attending: Radiation Oncology | Admitting: Radiation Oncology

## 2020-12-06 DIAGNOSIS — Z510 Encounter for antineoplastic radiation therapy: Secondary | ICD-10-CM | POA: Diagnosis not present

## 2020-12-06 DIAGNOSIS — C44311 Basal cell carcinoma of skin of nose: Secondary | ICD-10-CM | POA: Diagnosis not present

## 2020-12-10 ENCOUNTER — Ambulatory Visit
Admission: RE | Admit: 2020-12-10 | Discharge: 2020-12-10 | Disposition: A | Discharge status: Home / Self Care | Payer: Medicare Other | Source: Ambulatory Visit | Attending: Radiation Oncology | Admitting: Radiation Oncology

## 2020-12-10 DIAGNOSIS — C44311 Basal cell carcinoma of skin of nose: Secondary | ICD-10-CM | POA: Diagnosis not present

## 2020-12-10 DIAGNOSIS — Z510 Encounter for antineoplastic radiation therapy: Secondary | ICD-10-CM | POA: Diagnosis not present

## 2020-12-11 ENCOUNTER — Ambulatory Visit
Admission: RE | Admit: 2020-12-11 | Discharge: 2020-12-11 | Disposition: A | Discharge status: Home / Self Care | Payer: Medicare Other | Source: Ambulatory Visit | Attending: Radiation Oncology | Admitting: Radiation Oncology

## 2020-12-11 DIAGNOSIS — Z510 Encounter for antineoplastic radiation therapy: Secondary | ICD-10-CM | POA: Diagnosis not present

## 2020-12-11 DIAGNOSIS — C44311 Basal cell carcinoma of skin of nose: Secondary | ICD-10-CM | POA: Diagnosis not present

## 2020-12-12 ENCOUNTER — Ambulatory Visit
Admission: RE | Admit: 2020-12-12 | Discharge: 2020-12-12 | Disposition: A | Discharge status: Home / Self Care | Payer: Medicare Other | Source: Ambulatory Visit | Attending: Radiation Oncology | Admitting: Radiation Oncology

## 2020-12-12 DIAGNOSIS — Z510 Encounter for antineoplastic radiation therapy: Secondary | ICD-10-CM | POA: Diagnosis not present

## 2020-12-12 DIAGNOSIS — C44311 Basal cell carcinoma of skin of nose: Secondary | ICD-10-CM | POA: Diagnosis not present

## 2020-12-13 ENCOUNTER — Ambulatory Visit
Admission: RE | Admit: 2020-12-13 | Discharge: 2020-12-13 | Disposition: A | Discharge status: Home / Self Care | Payer: Medicare Other | Source: Ambulatory Visit | Attending: Radiation Oncology | Admitting: Radiation Oncology

## 2020-12-13 DIAGNOSIS — Z510 Encounter for antineoplastic radiation therapy: Secondary | ICD-10-CM | POA: Diagnosis not present

## 2020-12-13 DIAGNOSIS — C44311 Basal cell carcinoma of skin of nose: Secondary | ICD-10-CM | POA: Diagnosis not present

## 2020-12-17 ENCOUNTER — Ambulatory Visit
Admission: RE | Admit: 2020-12-17 | Discharge: 2020-12-17 | Disposition: A | Discharge status: Home / Self Care | Payer: Medicare Other | Source: Ambulatory Visit | Attending: Radiation Oncology | Admitting: Radiation Oncology

## 2020-12-17 DIAGNOSIS — Z510 Encounter for antineoplastic radiation therapy: Secondary | ICD-10-CM | POA: Insufficient documentation

## 2020-12-17 DIAGNOSIS — C44311 Basal cell carcinoma of skin of nose: Secondary | ICD-10-CM | POA: Diagnosis present

## 2020-12-17 DIAGNOSIS — Z87891 Personal history of nicotine dependence: Secondary | ICD-10-CM | POA: Diagnosis not present

## 2020-12-18 ENCOUNTER — Ambulatory Visit
Admission: RE | Admit: 2020-12-18 | Discharge: 2020-12-18 | Disposition: A | Discharge status: Home / Self Care | Payer: Medicare Other | Source: Ambulatory Visit | Attending: Radiation Oncology | Admitting: Radiation Oncology

## 2020-12-18 DIAGNOSIS — Z510 Encounter for antineoplastic radiation therapy: Secondary | ICD-10-CM | POA: Diagnosis not present

## 2020-12-18 DIAGNOSIS — C44311 Basal cell carcinoma of skin of nose: Secondary | ICD-10-CM | POA: Diagnosis not present

## 2020-12-19 ENCOUNTER — Ambulatory Visit
Admission: RE | Admit: 2020-12-19 | Discharge: 2020-12-19 | Disposition: A | Discharge status: Home / Self Care | Payer: Medicare Other | Source: Ambulatory Visit | Attending: Radiation Oncology | Admitting: Radiation Oncology

## 2020-12-19 DIAGNOSIS — Z510 Encounter for antineoplastic radiation therapy: Secondary | ICD-10-CM | POA: Diagnosis not present

## 2020-12-19 DIAGNOSIS — C44311 Basal cell carcinoma of skin of nose: Secondary | ICD-10-CM | POA: Diagnosis not present

## 2020-12-20 ENCOUNTER — Ambulatory Visit
Admission: RE | Admit: 2020-12-20 | Discharge: 2020-12-20 | Disposition: A | Discharge status: Home / Self Care | Payer: Medicare Other | Source: Ambulatory Visit | Attending: Radiation Oncology | Admitting: Radiation Oncology

## 2020-12-20 DIAGNOSIS — C44311 Basal cell carcinoma of skin of nose: Secondary | ICD-10-CM | POA: Diagnosis not present

## 2020-12-20 DIAGNOSIS — Z510 Encounter for antineoplastic radiation therapy: Secondary | ICD-10-CM | POA: Diagnosis not present

## 2020-12-21 ENCOUNTER — Ambulatory Visit
Admission: RE | Admit: 2020-12-21 | Discharge: 2020-12-21 | Disposition: A | Discharge status: Home / Self Care | Payer: Medicare Other | Source: Ambulatory Visit | Attending: Radiation Oncology | Admitting: Radiation Oncology

## 2020-12-21 DIAGNOSIS — Z87891 Personal history of nicotine dependence: Secondary | ICD-10-CM | POA: Diagnosis not present

## 2020-12-21 DIAGNOSIS — C44311 Basal cell carcinoma of skin of nose: Secondary | ICD-10-CM | POA: Diagnosis not present

## 2020-12-21 DIAGNOSIS — Z510 Encounter for antineoplastic radiation therapy: Secondary | ICD-10-CM | POA: Diagnosis not present

## 2020-12-24 ENCOUNTER — Ambulatory Visit
Admission: RE | Admit: 2020-12-24 | Discharge: 2020-12-24 | Disposition: A | Discharge status: Home / Self Care | Payer: Medicare Other | Source: Ambulatory Visit | Attending: Radiation Oncology | Admitting: Radiation Oncology

## 2020-12-24 DIAGNOSIS — Z510 Encounter for antineoplastic radiation therapy: Secondary | ICD-10-CM | POA: Diagnosis not present

## 2020-12-24 DIAGNOSIS — C44311 Basal cell carcinoma of skin of nose: Secondary | ICD-10-CM | POA: Diagnosis not present

## 2020-12-25 ENCOUNTER — Ambulatory Visit: Payer: Medicare Other

## 2020-12-26 ENCOUNTER — Ambulatory Visit
Admission: RE | Admit: 2020-12-26 | Discharge: 2020-12-26 | Disposition: A | Discharge status: Home / Self Care | Payer: Medicare Other | Source: Ambulatory Visit | Attending: Radiation Oncology | Admitting: Radiation Oncology

## 2020-12-26 ENCOUNTER — Inpatient Hospital Stay (HOSPITAL_COMMUNITY): Admission: RE | Admit: 2020-12-26 | Payer: Medicare Other | Source: Ambulatory Visit | Admitting: Nurse Practitioner

## 2020-12-26 ENCOUNTER — Ambulatory Visit (HOSPITAL_COMMUNITY): Payer: Medicare Other | Admitting: Nurse Practitioner

## 2020-12-26 DIAGNOSIS — C44311 Basal cell carcinoma of skin of nose: Secondary | ICD-10-CM | POA: Diagnosis not present

## 2020-12-26 DIAGNOSIS — Z510 Encounter for antineoplastic radiation therapy: Secondary | ICD-10-CM | POA: Diagnosis not present

## 2020-12-27 ENCOUNTER — Encounter (HOSPITAL_COMMUNITY): Payer: Self-pay | Admitting: Nurse Practitioner

## 2020-12-27 ENCOUNTER — Ambulatory Visit (HOSPITAL_COMMUNITY)
Admission: RE | Admit: 2020-12-27 | Discharge: 2020-12-27 | Disposition: A | Discharge status: Home / Self Care | Payer: Medicare Other | Source: Ambulatory Visit | Attending: Nurse Practitioner | Admitting: Nurse Practitioner

## 2020-12-27 ENCOUNTER — Ambulatory Visit
Admission: RE | Admit: 2020-12-27 | Discharge: 2020-12-27 | Disposition: A | Discharge status: Home / Self Care | Payer: Medicare Other | Source: Ambulatory Visit | Attending: Radiation Oncology | Admitting: Radiation Oncology

## 2020-12-27 ENCOUNTER — Other Ambulatory Visit: Payer: Self-pay

## 2020-12-27 VITALS — BP 150/66 | HR 54 | Ht 64.0 in | Wt 156.4 lb

## 2020-12-27 DIAGNOSIS — K219 Gastro-esophageal reflux disease without esophagitis: Secondary | ICD-10-CM | POA: Insufficient documentation

## 2020-12-27 DIAGNOSIS — I480 Paroxysmal atrial fibrillation: Secondary | ICD-10-CM | POA: Diagnosis not present

## 2020-12-27 DIAGNOSIS — Z87891 Personal history of nicotine dependence: Secondary | ICD-10-CM | POA: Insufficient documentation

## 2020-12-27 DIAGNOSIS — D509 Iron deficiency anemia, unspecified: Secondary | ICD-10-CM | POA: Diagnosis not present

## 2020-12-27 DIAGNOSIS — Z79899 Other long term (current) drug therapy: Secondary | ICD-10-CM | POA: Diagnosis not present

## 2020-12-27 DIAGNOSIS — Z7901 Long term (current) use of anticoagulants: Secondary | ICD-10-CM | POA: Diagnosis not present

## 2020-12-27 DIAGNOSIS — Z510 Encounter for antineoplastic radiation therapy: Secondary | ICD-10-CM | POA: Diagnosis not present

## 2020-12-27 DIAGNOSIS — D6869 Other thrombophilia: Secondary | ICD-10-CM

## 2020-12-27 DIAGNOSIS — Z8249 Family history of ischemic heart disease and other diseases of the circulatory system: Secondary | ICD-10-CM | POA: Diagnosis not present

## 2020-12-27 DIAGNOSIS — I10 Essential (primary) hypertension: Secondary | ICD-10-CM | POA: Insufficient documentation

## 2020-12-27 DIAGNOSIS — C44311 Basal cell carcinoma of skin of nose: Secondary | ICD-10-CM | POA: Diagnosis not present

## 2020-12-27 MED ORDER — APIXABAN 5 MG PO TABS
ORAL_TABLET | ORAL | Status: DC
Start: 2020-12-27 — End: 2021-04-03

## 2020-12-27 MED ORDER — METOPROLOL SUCCINATE ER 25 MG PO TB24
12.5000 mg | ORAL_TABLET | Freq: Every day | ORAL | Status: DC
Start: 2020-12-27 — End: 2021-06-18

## 2020-12-27 NOTE — Progress Notes (Signed)
Primary Care Physician: Ria Bush, MD Referring Physician: Ignacia Bayley, NP   Alexandria Moreno is a 80 y.o. female with a h/o rheumatic fever at age 36, mild valvular disease, hyperlipidemia, iron deficiency anemia without history of GI bleed, GERD, migraines, and palpitations, who presents for follow-up after recent finding of atrial fibrillation on event monitoring.  She has been on Toprol XL 25 mg daily, for years, HR's in the 60's at baseline. Review of her monitor  shows 2% afib burden, with longest episode  one hour duration, mostly rate controlled below 100 bpm. She was started on eliquis 5 mg bid for CHA2DS2VASc score of 3.  On previous visit, she was started on Multaq for PAF. I am seeing her for a one year f/u. This has been working well for her with very little afib burden. Longest episode 20 mins. She is under stress as her husband had a syncopal spell this last week which prompted a ER visit but no cause was found. He is currently doing ok.   Today, she denies symptoms of palpitations, chest pain, shortness of breath, orthopnea, PND, lower extremity edema, dizziness, presyncope, syncope, or neurologic sequela. The patient is tolerating medications without difficulties and is otherwise without complaint today.   Past Medical History:  Diagnosis Date  . Basal cell carcinoma 05/22/2015   R nasal ala  . Basal cell carcinoma 07/21/2017   L upper arm, L neck, L medial shoulder  . Basal cell carcinoma 09/21/2018   R shoulder  . Basal cell carcinoma 05/01/2020   L lower neck   . Basal cell carcinoma 05/01/2020   L neck ant to scar   . Basal cell carcinoma 11/05/2020   L nasal ala, refer for Radiation  . Diverticulosis   . DJD (degenerative joint disease) of knee   . GERD (gastroesophageal reflux disease)   . H/o Iron deficiency anemia 2009   a. 05/2008 EGD/Colonoscopy: nl w/o evidence of bleeding; b. 11/2017 EGD: nl. Colonoscopy: 27mm cecal polyp, otw nl.  . H/O transfusion  of whole blood 2009  . H/O: rheumatic fever    80 years old  . History of chicken pox   . History of echocardiogram    a. 09/2019 Echo: EF 60-65%, nl RV size/fxn. Nl Atrial sizes. Triv TR. Mild AI.  Marland Kitchen Hx of basal cell carcinoma 2010   L dorsum tip of nose (MO[facility]), R ant alar groove (MOHS)  . 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 Hyperlipidemia   . Metatarsal fracture 09/2020   L 5th shaft s/p conservative management [facility])  . Migraines   . PAF (paroxysmal atrial fibrillation) ([place])    a. 08/2019 Zio: Avg HR 60 (min 43, max 144). PAF (2% burden), longest 1h 25m (avg rate 77). Rare PAC's and PVC's.  . Palpitation   . Pelvic prolapse    with rectocele  . Rosacea   . Seasonal allergic rhinitis    mild  . 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 Stress incontinence    Past Surgical History:  Procedure Laterality Date  . ABDOMINAL HYSTERECTOMY  1981  . APPENDECTOMY  1960  . capsule endoscopy  2009   WNL  . CATARACT EXTRACTION Bilateral [id number]  . COLONOSCOPY  2009   diverticulosis ([place])  . COLONOSCOPY WITH PROPOFOL N/A 12/01/2017   TA, rpt 5 yrs ([facility])  . DEXA  06/2016   T -0.2 hip, 0.7 spine  .  ESOPHAGOGASTRODUODENOSCOPY  2009   focal mild chronic gastritis, neg H pylori ([facility])  . ESOPHAGOGASTRODUODENOSCOPY (EGD) WITH PROPOFOL N/A 12/01/2017   Procedure: ESOPHAGOGASTRODUODENOSCOPY (EGD) WITH PROPOFOL;  Surgeon: Lucilla Lame, MD;  Location: [facility] ENDOSCOPY;  Service: Endoscopy;  Laterality: N/A;  . INCONTINENCE SURGERY  2001   with mesh  . LIPOMA EXCISION  1971   right shoulder  . [place]  . OVARIAN CYST REMOVAL  1960  . SALPINGOOPHORECTOMY Left 2001  . TONSILLECTOMY  1958    Current Outpatient Medications  Medication Sig Dispense Refill  . atorvastatin (LIPITOR) 40 MG tablet Take 1 tablet (40 mg total) by mouth daily. 90 tablet 3  . B COMPLEX VITAMINS PO Take 1 tablet by mouth daily.    .  chlorhexidine (PERIDEX) 0.12 % solution as needed.   0  . cycloSPORINE (RESTASIS) 0.05 % ophthalmic emulsion Apply 0.05 drops to eye 2 (two) times daily.    . diphenoxylate-atropine (LOMOTIL) 2.5-0.025 MG tablet Take 1 tablet by mouth 4 (four) times daily. (Patient taking differently: Take 1 tablet by mouth as needed.) 360 tablet 3  . glucosamine-chondroitin 500-400 MG tablet Take 1 tablet by mouth 2 (two) times daily.    . Methylcellulose, Laxative, (FIBER THERAPY PO) Take 3 tablets by mouth daily.    . MULTAQ 400 MG tablet TAKE 1 TABLET TWICE A DAY WITH MEALS 180 tablet 0  . MULTIPLE VITAMIN PO Take by mouth daily.    Marland Kitchen omeprazole (PRILOSEC) 40 MG capsule Take 1 capsule (40 mg total) by mouth 2 (two) times daily. 180 capsule 3  . rizatriptan (MAXALT) 10 MG tablet Take 10 mg by mouth daily as needed.    . topiramate (TOPAMAX) 50 MG tablet Take 1 tablet (50 mg total) by mouth at bedtime.    Marland Kitchen VITAMIN D PO Taking one gummy by mouth daily- 5049mcg    . apixaban (ELIQUIS) 5 MG TABS tablet Take one tablet by mouth daily, will resume to 2 tablets daily on 01/05/21    . metoprolol succinate (TOPROL-XL) 25 MG 24 hr tablet Take 0.5 tablets (12.5 mg total) by mouth daily.     No current facility-administered medications for this encounter.    Allergies  Allergen Reactions  . Aspirin Other (See Comments)    GI bleed  . Baclofen Diarrhea  . Codeine Nausea And Vomiting  . Imitrex  [Sumatriptan]     Palpatations  . Influenza Vaccines Swelling  . Vicodin  [Hydrocodone-Acetaminophen] Nausea And Vomiting    Social History   Socioeconomic History  . Marital status: Married    Spouse name: Not on file  . Number of children: Not on file  . Years of education: Not on file  . Highest education level: Not on file  Occupational History  . Not on file  Tobacco Use  . Smoking status: Former Smoker    Packs/day: 0.50    Years: 20.00    Pack years: 10.00    Types: Cigarettes    Quit date: 12/15/1978     Years since quitting: 42.0  . Smokeless tobacco: Never Used  Vaping Use  . Vaping Use: Never used  Substance and Sexual Activity  . Alcohol use: No    Alcohol/week: 0.0 standard drinks  . Drug use: No  . Sexual activity: Never  Other Topics Concern  . Not on file  Social History Narrative   Lives with husband    Occupation: retired, was Art therapist   Edu:  some college   Activity: stationary bicycle   Diet: good water, following slim fast diet, good vegetables   Social Determinants of Health   Financial Resource Strain: Low Risk   . Difficulty of Paying Living Expenses: Not hard at all  Food Insecurity: No Food Insecurity  . Worried About Charity fundraiser in the Last Year: Never true  . Ran Out of Food in the Last Year: Never true  Transportation Needs: No Transportation Needs  . Lack of Transportation (Medical): No  . Lack of Transportation (Non-Medical): No  Physical Activity: Insufficiently Active  . Days of Exercise per Week: 1 day  . Minutes of Exercise per Session: 60 min  Stress: No Stress Concern Present  . Feeling of Stress : Not at all  Social Connections: Not on file  Intimate Partner Violence: Not At Risk  . Fear of Current or Ex-Partner: No  . Emotionally Abused: No  . Physically Abused: No  . Sexually Abused: No    Family History  Problem Relation Age of Onset  . Stroke Mother 21  . Cancer Mother        Cervical  . Depression Mother   . Dementia Mother        vascular  . Heart disease Father        CABG  . Alcohol abuse Father   . Osteoporosis Father   . Heart attack Father   . Hypertension Brother   . Heart attack Brother   . Stroke Brother   . Stroke Brother 47  . Heart attack Brother   . Stroke Paternal Grandfather   . Alzheimer's disease Maternal Grandfather   . Breast cancer Neg Hx     ROS- All systems are reviewed and negative except as per the HPI above  Physical Exam: Vitals:   12/27/20 1514  BP: (!) 150/66  Pulse:  (!) 54  Weight: 70.9 kg  Height: 5\' 4"  (1.626 m)   Wt Readings from Last 3 Encounters:  12/27/20 70.9 kg  11/19/20 (P) 71.4 kg  11/12/20 70.3 kg    Labs: Lab Results  Component Value Date   NA 142 08/03/2020   K 4.1 08/03/2020   CL 109 08/03/2020   CO2 25 08/03/2020   GLUCOSE 81 08/03/2020   BUN 15 08/03/2020   CREATININE 0.89 08/03/2020   CALCIUM 9.4 08/03/2020   MG 2.2 07/21/2019   Lab Results  Component Value Date   INR 1.1 11/11/2019   Lab Results  Component Value Date   CHOL 141 08/03/2020   HDL 39.90 08/03/2020   LDLCALC 62 08/03/2020   TRIG 194.0 (H) 08/03/2020     GEN- The patient is well appearing, alert and oriented x 3 today.   Head- normocephalic, atraumatic Eyes-  Sclera clear, conjunctiva pink Ears- hearing intact Oropharynx- clear Neck- supple, no JVP Lymph- no cervical lymphadenopathy Lungs- Clear to ausculation bilaterally, normal work of breathing Heart- Regular rate and rhythm, no murmurs, rubs or gallops, PMI not laterally displaced GI- soft, NT, ND, + BS Extremities- no clubbing, cyanosis, or edema MS- no significant deformity or atrophy Skin- no rash or lesion Psych- euthymic mood, full affect Neuro- strength and sensation are intact  EKG-NSR at 54 bpm, pr int 214 ms, qrs int 80 ms, qtc 413 ms( stable) Epic records reviewed    Assessment and Plan: 1.Paroxosymal  afib Doing well on Multaq 400 mg bid to control afib.  Continue current  BB dose, heart rate runs low 50's. If  slower may need to d/c BB Pt watches HR on her watch cmet today  2. CHA2DS2VASc score of 3 Continue eliquis 5 mg bid  Bleeding precautions  discussed   3. HTN Mildly elevated today Pt states usually controlled   F/u with cardiologist in 6 months Here in one year  Butch Penny C. Brigitta Pricer, [facility] [address] [place], [place] [address] [phone]

## 2020-12-28 ENCOUNTER — Encounter (HOSPITAL_COMMUNITY): Payer: Self-pay

## 2020-12-28 ENCOUNTER — Ambulatory Visit
Admission: RE | Admit: 2020-12-28 | Discharge: 2020-12-28 | Disposition: A | Discharge status: Home / Self Care | Payer: Medicare Other | Source: Ambulatory Visit | Attending: Radiation Oncology | Admitting: Radiation Oncology

## 2020-12-28 DIAGNOSIS — C44311 Basal cell carcinoma of skin of nose: Secondary | ICD-10-CM | POA: Diagnosis not present

## 2020-12-28 DIAGNOSIS — Z510 Encounter for antineoplastic radiation therapy: Secondary | ICD-10-CM | POA: Diagnosis not present

## 2020-12-31 ENCOUNTER — Ambulatory Visit
Admission: RE | Admit: 2020-12-31 | Discharge: 2020-12-31 | Disposition: A | Discharge status: Home / Self Care | Payer: Medicare Other | Source: Ambulatory Visit | Attending: Radiation Oncology | Admitting: Radiation Oncology

## 2020-12-31 DIAGNOSIS — C44311 Basal cell carcinoma of skin of nose: Secondary | ICD-10-CM | POA: Diagnosis not present

## 2020-12-31 DIAGNOSIS — Z87891 Personal history of nicotine dependence: Secondary | ICD-10-CM | POA: Diagnosis not present

## 2020-12-31 DIAGNOSIS — Z510 Encounter for antineoplastic radiation therapy: Secondary | ICD-10-CM | POA: Diagnosis not present

## 2021-01-01 ENCOUNTER — Ambulatory Visit
Admission: RE | Admit: 2021-01-01 | Discharge: 2021-01-01 | Disposition: A | Discharge status: Home / Self Care | Payer: Medicare Other | Source: Ambulatory Visit | Attending: Radiation Oncology | Admitting: Radiation Oncology

## 2021-01-01 ENCOUNTER — Telehealth: Payer: Self-pay

## 2021-01-01 DIAGNOSIS — Z510 Encounter for antineoplastic radiation therapy: Secondary | ICD-10-CM | POA: Diagnosis not present

## 2021-01-01 DIAGNOSIS — C44311 Basal cell carcinoma of skin of nose: Secondary | ICD-10-CM | POA: Diagnosis not present

## 2021-01-01 NOTE — Telephone Encounter (Signed)
Confirmed with pt she dropped an earring while with husband at today's OV.   [Placed earring in envelope at front office- yellow folders.]

## 2021-01-02 ENCOUNTER — Ambulatory Visit
Admission: RE | Admit: 2021-01-02 | Discharge: 2021-01-02 | Disposition: A | Discharge status: Home / Self Care | Payer: Medicare Other | Source: Ambulatory Visit | Attending: Radiation Oncology | Admitting: Radiation Oncology

## 2021-01-02 DIAGNOSIS — Z510 Encounter for antineoplastic radiation therapy: Secondary | ICD-10-CM | POA: Diagnosis not present

## 2021-01-02 DIAGNOSIS — C44311 Basal cell carcinoma of skin of nose: Secondary | ICD-10-CM | POA: Diagnosis not present

## 2021-01-03 ENCOUNTER — Ambulatory Visit: Payer: Medicare Other

## 2021-01-03 ENCOUNTER — Ambulatory Visit
Admission: RE | Admit: 2021-01-03 | Discharge: 2021-01-03 | Disposition: A | Discharge status: Home / Self Care | Payer: Medicare Other | Source: Ambulatory Visit | Attending: Radiation Oncology | Admitting: Radiation Oncology

## 2021-01-03 DIAGNOSIS — Z510 Encounter for antineoplastic radiation therapy: Secondary | ICD-10-CM | POA: Diagnosis not present

## 2021-01-03 DIAGNOSIS — H40003 Preglaucoma, unspecified, bilateral: Secondary | ICD-10-CM | POA: Diagnosis not present

## 2021-01-03 DIAGNOSIS — C44311 Basal cell carcinoma of skin of nose: Secondary | ICD-10-CM | POA: Diagnosis not present

## 2021-01-04 ENCOUNTER — Ambulatory Visit
Admission: RE | Admit: 2021-01-04 | Discharge: 2021-01-04 | Disposition: A | Discharge status: Home / Self Care | Payer: Medicare Other | Source: Ambulatory Visit | Attending: Radiation Oncology | Admitting: Radiation Oncology

## 2021-01-04 DIAGNOSIS — C44311 Basal cell carcinoma of skin of nose: Secondary | ICD-10-CM | POA: Diagnosis not present

## 2021-01-04 DIAGNOSIS — Z510 Encounter for antineoplastic radiation therapy: Secondary | ICD-10-CM | POA: Diagnosis not present

## 2021-01-08 DIAGNOSIS — H40003 Preglaucoma, unspecified, bilateral: Secondary | ICD-10-CM | POA: Diagnosis not present

## 2021-01-14 ENCOUNTER — Telehealth: Payer: Self-pay | Admitting: Cardiology

## 2021-01-14 MED ORDER — MULTAQ 400 MG PO TABS
400.0000 mg | ORAL_TABLET | Freq: Two times a day (BID) | ORAL | 1 refills | Status: DC
Start: 2021-01-14 — End: 2021-04-03

## 2021-01-14 NOTE — Telephone Encounter (Signed)
Received fax from [facility] requesting refills for Multaq 400 mg. Rx request sent to pharmacy.

## 2021-01-24 DIAGNOSIS — Z636 Dependent relative needing care at home: Secondary | ICD-10-CM | POA: Diagnosis not present

## 2021-01-24 DIAGNOSIS — I480 Paroxysmal atrial fibrillation: Secondary | ICD-10-CM | POA: Diagnosis not present

## 2021-01-24 DIAGNOSIS — G43119 Migraine with aura, intractable, without status migrainosus: Secondary | ICD-10-CM | POA: Diagnosis not present

## 2021-02-11 ENCOUNTER — Ambulatory Visit
Admission: RE | Admit: 2021-02-11 | Discharge: 2021-02-11 | Disposition: A | Discharge status: Home / Self Care | Payer: Medicare Other | Source: Ambulatory Visit | Attending: Radiation Oncology | Admitting: Radiation Oncology

## 2021-02-11 ENCOUNTER — Encounter: Payer: Self-pay | Admitting: Radiation Oncology

## 2021-02-11 DIAGNOSIS — C44311 Basal cell carcinoma of skin of nose: Secondary | ICD-10-CM | POA: Insufficient documentation

## 2021-02-11 DIAGNOSIS — Z923 Personal history of irradiation: Secondary | ICD-10-CM | POA: Diagnosis not present

## 2021-02-11 NOTE — Progress Notes (Signed)
Radiation Oncology Follow up Note  Name: Alexandria Moreno   Date:   02/11/2021 MRN:  [id number] DOB: 1941/06/21    This 80 y.o. female presents to the clinic today for 1 month follow-up status post electron-beam therapy to her left nasal alla for a superficial nodular basal cell carcinoma.  REFERRING PROVIDER: Ria Bush, MD  HPI: Patient is a 80 year old female now out 1 month having complete electron-beam radiation therapy to her left nasal alla for a nodular basal cell carcinoma.  Seen today in routine follow-up she is doing well she still has some slight persistent erythema of the skin in this area no evidence of mass or nodularity is noted.  COMPLICATIONS OF TREATMENT: none  FOLLOW UP COMPLIANCE: keeps appointments   PHYSICAL EXAM:  BP (!) (P) 143/57 (BP Location: Left Arm, Patient Position: Sitting)   Pulse (P) 61   Temp (!) (P) 96.5 F (35.8 C) (Tympanic)   Resp (P) 16   Wt (P) 157 lb 1.6 oz (71.3 kg)   BMI (P) 26.97 kg/m  Slight area of erythema in the left nasal alla no evidence of any other facial lesions or head and neck adenopathy is appreciated.  Well-developed well-nourished patient in NAD. HEENT reveals PERLA, EOMI, discs not visualized.  Oral cavity is clear. No oral mucosal lesions are identified. Neck is clear without evidence of cervical or supraclavicular adenopathy. Lungs are clear to A&P. Cardiac examination is essentially unremarkable with regular rate and rhythm without murmur rub or thrill. Abdomen is benign with no organomegaly or masses noted. Motor sensory and DTR levels are equal and symmetric in the upper and lower extremities. Cranial nerves II through XII are grossly intact. Proprioception is intact. No peripheral adenopathy or edema is identified. No motor or sensory levels are noted. Crude visual fields are within normal range.  RADIOLOGY RESULTS: No current films to review  PLAN: Present time patient has had a complete response to electron-beam  therapy.  Have assured her that some of the erythema will still resolve over time.  I have asked her to schedule follow-up appointments with her dermatologist.  I have asked to see her back in 4 to 5 months for follow-up.  Patient knows to call at anytime for any concerns.  I would like to take this opportunity to thank you for allowing me to participate in the care of your patient.Noreene Filbert, MD

## 2021-03-07 ENCOUNTER — Ambulatory Visit: Payer: Medicare Other | Admitting: Cardiology

## 2021-03-22 ENCOUNTER — Ambulatory Visit: Payer: Medicare Other | Admitting: Cardiology

## 2021-04-03 ENCOUNTER — Encounter: Payer: Self-pay | Admitting: Nurse Practitioner

## 2021-04-03 ENCOUNTER — Ambulatory Visit (INDEPENDENT_AMBULATORY_CARE_PROVIDER_SITE_OTHER): Payer: Medicare Other | Admitting: Nurse Practitioner

## 2021-04-03 ENCOUNTER — Other Ambulatory Visit: Payer: Self-pay

## 2021-04-03 VITALS — BP 120/64 | HR 57 | Ht 64.0 in | Wt 160.2 lb

## 2021-04-03 DIAGNOSIS — I480 Paroxysmal atrial fibrillation: Secondary | ICD-10-CM | POA: Diagnosis not present

## 2021-04-03 MED ORDER — ELIQUIS 5 MG PO TABS: 5.0000 mg | ORAL_TABLET | Freq: Two times a day (BID) | ORAL | 2 refills | Status: DC

## 2021-04-03 MED ORDER — MULTAQ 400 MG PO TABS: 400.0000 mg | ORAL_TABLET | Freq: Two times a day (BID) | ORAL | 2 refills | Status: DC

## 2021-04-03 NOTE — Progress Notes (Signed)
Office Visit    Patient Name: Alexandria Moreno Date of Encounter: 04/03/2021  Primary Care Provider:  Ria Bush, MD Primary Cardiologist:  Kate Sable, MD  Chief Complaint    80 year old female with a history of rheumatic fever at age 62, paroxysmal atrial fibrillation, mild valvular disease, hyperlipidemia, iron deficiency anemia without history of GI bleed, GERD, migraines, palpitations, and grade 2 diastolic dysfunction, who presents for follow-up related to A. fib.  Past Medical History    Past Medical History:  Diagnosis Date  . Basal cell carcinoma 05/22/2015   R nasal ala  . Basal cell carcinoma 07/21/2017   L upper arm, L neck, L medial shoulder  . Basal cell carcinoma 09/21/2018   R shoulder  . Basal cell carcinoma 05/01/2020   L lower neck   . Basal cell carcinoma 05/01/2020   L neck ant to scar   . Basal cell carcinoma 11/05/2020   L nasal ala, refer for Radiation  . Diastolic dysfunction    a. 09/2019 Echo: EF 60-65%, nl RV size/fxn. Nl Atrial sizes. Triv TR. Mild AI; b. 11/2020 Echo: EF 60-65%, no rwma, Gr2DD, nl RV size/fxn, RVSP 34.2mmHg. Mild MR/AI.  Marland Kitchen Diverticulosis   . DJD (degenerative joint disease) of knee   . GERD (gastroesophageal reflux disease)   . H/o Iron deficiency anemia 2009   a. 05/2008 EGD/Colonoscopy: nl w/o evidence of bleeding; b. 11/2017 EGD: nl. Colonoscopy: 18mm cecal polyp, otw nl.  . H/O transfusion of whole blood 2009  . H/O: rheumatic fever    80 years old  . History of chicken pox   . History of stress test    a. 10/2019 EF 53%, no ischemia/infarct. No significant cor Ca2+. Mild thoracic desc Ao atherosclerosis.  Marland Kitchen Hx of basal cell carcinoma 2010   L dorsum tip of nose (MOHS), R ant alar groove (MOHS)  . 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 Hyperlipidemia   . Metatarsal fracture 09/2020   L 5th shaft s/p conservative management Sabra Heck)  . Migraines   . PAF (paroxysmal atrial  fibrillation) ([place])    a. 08/2019 Zio: Avg HR 60 (min 43, max 144). PAF (2% burden), longest 1h 39m (avg rate 77). Rare PAC's and PVC's.  . Palpitation   . Pelvic prolapse    with rectocele  . Rosacea   . Seasonal allergic rhinitis    mild  . 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 Stress incontinence    Past Surgical History:  Procedure Laterality Date  . ABDOMINAL HYSTERECTOMY  1981  . APPENDECTOMY  1960  . capsule endoscopy  2009   WNL  . CATARACT EXTRACTION Bilateral [id number]  . COLONOSCOPY  2009   diverticulosis ([place])  . COLONOSCOPY WITH PROPOFOL N/A 12/01/2017   TA, rpt 5 yrs ([place])  . DEXA  06/2016   T -0.2 hip, 0.7 spine  . ESOPHAGOGASTRODUODENOSCOPY  2009   focal mild chronic gastritis, neg H pylori ([place])  . ESOPHAGOGASTRODUODENOSCOPY (EGD) WITH PROPOFOL N/A 12/01/2017   Procedure: ESOPHAGOGASTRODUODENOSCOPY (EGD) WITH PROPOFOL;  Surgeon: Lucilla Lame, MD;  Location: [facility] ENDOSCOPY;  Service: Endoscopy;  Laterality: N/A;  . INCONTINENCE SURGERY  2001   with mesh  . LIPOMA EXCISION  1971   right shoulder  . [place]  . OVARIAN CYST REMOVAL  1960  . SALPINGOOPHORECTOMY Left 2001  . TONSILLECTOMY  1958  Allergies  Allergies  Allergen Reactions  . Aspirin Other (See Comments)    GI bleed  . Baclofen Diarrhea  . Codeine Nausea And Vomiting  . Imitrex  [Sumatriptan]     Palpatations  . Influenza Vaccines Swelling  . Vicodin  [Hydrocodone-Acetaminophen] Nausea And Vomiting    History of Present Illness    80 year old female with the above past medical history including rheumatic fever at age 89, prior reported moderate mitral regurgitation, iron deficiency anemia without history of GI bleed, GERD, migraines, diastolic dysfunction, and a long history of palpitations for which she has been treated with metoprolol therapy.  In 2009, she was diagnosed with severe iron deficiency anemia.  EGD and colonoscopy in 2009  were negative for bleeding.  She was on aspirin at that time and it was discontinued.  More recently, EGD in December 2018 was normal with a colonoscopy that showed a single cecal polyp.  In September 2020, she established care with our practice in the setting of increasing palpitations with dizziness, dyspnea, and fatigue.  Echocardiogram showed normal LV function without evidence of mitral regurgitation.  Zio monitor was placed and showed paroxysmal atrial fibrillation with a 2% burden.  In the setting of CHA2DS2VASc of 3, Eliquis 5 mg twice daily was initiated.  She was referred to A. fib clinic in the setting of baseline bradycardia.  Stress testing was performed and was low risk.  She wished to defer sleep study.  Multaq 400 mg twice daily was started in December 2020, with significant improvement in burden of palpitations at most recent Afib clinic visit in 12/2020.  Today, she notes that she experiences a brief episode of palpitations about once per week, usually lasting about 15 to 30 minutes, and resolving spontaneously.  She continues to have a significant amount of life stress, as she continues to care for her husband who is suffering from Parkinson's and progressive memory loss.  When she experiences palpitations, it is often in the evening, when she finally gets a chance to sit down and relax.  She can tell that something is "off" but otherwise is not particularly symptomatic and overall feels that things are stable.  She does not think that she requires any adjustment to her therapy at this time.  She also continues to experience intermittent chest discomfort that has been going on for several years at very high levels of activity.  This is similar to what she experienced when she underwent stress testing in 2020.  She walks almost every day and is otherwise very active without symptoms or limitations it is just on the rare occasion that she has an episode of mild chest discomfort.  She denies PND,  orthopnea, dyspnea, dizziness, syncope, edema, or early satiety.  Home Medications    Prior to Admission medications   Medication Sig Start Date End Date Taking? Authorizing Provider  apixaban (ELIQUIS) 5 MG TABS tablet Take one tablet by mouth daily, will resume to 2 tablets daily on 01/05/21 12/27/20   Sherran Needs, NP  atorvastatin (LIPITOR) 40 MG tablet Take 1 tablet (40 mg total) by mouth daily. 08/07/20   Ria Bush, MD  B COMPLEX VITAMINS PO Take 1 tablet by mouth daily.    [provider]  chlorhexidine (PERIDEX) 0.12 % solution as needed.  11/10/17   [provider]  cycloSPORINE (RESTASIS) 0.05 % ophthalmic emulsion Apply 0.05 drops to eye 2 (two) times daily.    [provider]  diphenoxylate-atropine (LOMOTIL) 2.5-0.025 MG tablet  Take 1 tablet by mouth 4 (four) times daily. Patient taking differently: Take 1 tablet by mouth as needed. 03/19/20   Lucilla Lame, MD  dronedarone (MULTAQ) 400 MG tablet Take 1 tablet (400 mg total) by mouth 2 (two) times daily with a meal. 01/14/21   Kate Sable, MD  glucosamine-chondroitin 500-400 MG tablet Take 1 tablet by mouth 2 (two) times daily.    [provider]  Methylcellulose, Laxative, (FIBER THERAPY PO) Take 3 tablets by mouth daily.    [provider]  metoprolol succinate (TOPROL-XL) 25 MG 24 hr tablet Take 0.5 tablets (12.5 mg total) by mouth daily. 12/27/20   Sherran Needs, NP  MULTIPLE VITAMIN PO Take by mouth daily.    Roselee Nova, MD  omeprazole (PRILOSEC) 40 MG capsule Take 1 capsule (40 mg total) by mouth 2 (two) times daily. 03/19/20   Lucilla Lame, MD  rizatriptan (MAXALT) 10 MG tablet Take 10 mg by mouth daily as needed.    Roselee Nova, MD  topiramate (TOPAMAX) 50 MG tablet Take 1 tablet (50 mg total) by mouth at bedtime. 08/07/20   Ria Bush, MD  VITAMIN D PO Taking one gummy by mouth daily- 5036mcg    [provider]    Review of Systems     Once weekly palpitations lasting about 30 minutes, which she identifies as A. fib.  This typically occurs at rest.  Higher levels of activity, she may experience chest discomfort which is similar to what has been going on for several years with negative stress testing in 2020.  She denies dyspnea, PND, orthopnea, dizziness, syncope, edema, or early satiety.  All other systems reviewed and are otherwise negative except as noted above.  Physical Exam    VS:  BP 120/64 (BP Location: Left Arm, Patient Position: Sitting, Cuff Size: Normal)   Pulse (!) 57   Ht 5\' 4"  (1.626 m)   Wt 160 lb 4 oz (72.7 kg)   SpO2 98%   BMI 27.51 kg/m  , BMI Body mass index is 27.51 kg/m. GEN: Well nourished, well developed, in no acute distress. HEENT: normal. Neck: Supple, no JVD, carotid bruits, or masses. Cardiac: RRR, no murmurs, rubs, or gallops. No clubbing, cyanosis, edema.  Radials/PT 2+ and equal bilaterally.  Respiratory:  Respirations regular and unlabored, clear to auscultation bilaterally. GI: Soft, nontender, nondistended, BS + x 4. MS: no deformity or atrophy. Skin: warm and dry, no rash. Neuro:  Strength and sensation are intact. Psych: Normal affect.  Accessory Clinical Findings    ECG personally reviewed by me today -sinus bradycardia, 57, first-degree AV block, baseline artifact, delayed R wave progression- no acute changes.  Lab Results  Component Value Date   WBC 3.9 (L) 10/17/2020   HGB 15.0 10/17/2020   HCT 44.3 10/17/2020   MCV 91.9 10/17/2020   PLT 143.0 (L) 10/17/2020   Lab Results  Component Value Date   CREATININE 0.89 08/03/2020   BUN 15 08/03/2020   NA 142 08/03/2020   K 4.1 08/03/2020   CL 109 08/03/2020   CO2 25 08/03/2020   Lab Results  Component Value Date   ALT 22 08/03/2020   AST 24 08/03/2020   ALKPHOS 87 08/03/2020   BILITOT 0.6 08/03/2020   Lab Results  Component Value Date   CHOL 141 08/03/2020   HDL 39.90 08/03/2020   LDLCALC 62 08/03/2020    TRIG 194.0 (H) 08/03/2020   CHOLHDL 4 08/03/2020    Assessment &  Plan    1.  Paroxysmal atrial fibrillation: Overall, she feels that symptoms are stable but definitely sees an increase in frequency of symptoms, now occurring about once a week lasting between 15 and 30 minutes.  Despite the increase in symptoms, she feels that overall things are stable and she is not particularly symptomatic.  She remains on Multaq therapy as well as Eliquis and has been tolerating both.  She is on only low-dose metoprolol in the setting of baseline bradycardia.  She understands that if she has increasing frequency or duration of A. fib that she is to notify us as we would likely adjust antiarrhythmic therapy.  2.  Stable angina: Patient with a several year history of intermittent exertional chest discomfort at higher levels of activity.  She had similar symptoms when she underwent stress testing in November 2020, which was low risk.  It is notable that she also did not have any significant coronary calcification on CT imaging at the time of stress testing.  She can tolerate routine activities and exercise without limitations but when she pushes the pace more, she may experience an episode of chest discomfort.  She remains on beta-blocker and statin therapy.  3.  Hyperlipidemia: LDL of 62 in August 2021.  She remains on statin therapy.  4.  Disposition: Follow-up in clinic in 6 months or sooner if necessary.  Murray Hodgkins, NP 04/03/2021, 6:13 PM

## 2021-04-03 NOTE — Patient Instructions (Signed)
Medication Instructions:  No changes  *If you need a refill on your cardiac medications before your next appointment, please call your pharmacy*   Lab Work: None  If you have labs (blood work) drawn today and your tests are completely normal, you will receive your results only by: Marland Kitchen MyChart Message (if you have MyChart) OR . A paper copy in the mail If you have any lab test that is abnormal or we need to change your treatment, we will call you to review the results.   Testing/Procedures: None   Follow-Up: At [facility], you and your health needs are our priority.  As part of our continuing mission to provide you with exceptional heart care, we have created designated Provider Care Teams.  These Care Teams include your primary Cardiologist (physician) and Advanced Practice Providers (APPs -  Physician Assistants and Nurse Practitioners) who all work together to provide you with the care you need, when you need it.   Your next appointment:   6 month(s)  The format for your next appointment:   In Person  Provider:   Kate Sable, MD or Murray Hodgkins, NP

## 2021-05-06 ENCOUNTER — Encounter: Payer: Self-pay | Admitting: Dermatology

## 2021-05-06 ENCOUNTER — Ambulatory Visit (INDEPENDENT_AMBULATORY_CARE_PROVIDER_SITE_OTHER): Payer: Medicare Other | Admitting: Dermatology

## 2021-05-06 ENCOUNTER — Other Ambulatory Visit: Payer: Self-pay

## 2021-05-06 DIAGNOSIS — D1800 Hemangioma unspecified site: Secondary | ICD-10-CM | POA: Diagnosis not present

## 2021-05-06 DIAGNOSIS — C4441 Basal cell carcinoma of skin of scalp and neck: Secondary | ICD-10-CM

## 2021-05-06 DIAGNOSIS — Z85828 Personal history of other malignant neoplasm of skin: Secondary | ICD-10-CM | POA: Diagnosis not present

## 2021-05-06 DIAGNOSIS — L814 Other melanin hyperpigmentation: Secondary | ICD-10-CM | POA: Diagnosis not present

## 2021-05-06 DIAGNOSIS — D229 Melanocytic nevi, unspecified: Secondary | ICD-10-CM

## 2021-05-06 DIAGNOSIS — L578 Other skin changes due to chronic exposure to nonionizing radiation: Secondary | ICD-10-CM

## 2021-05-06 DIAGNOSIS — L719 Rosacea, unspecified: Secondary | ICD-10-CM | POA: Diagnosis not present

## 2021-05-06 DIAGNOSIS — L821 Other seborrheic keratosis: Secondary | ICD-10-CM | POA: Diagnosis not present

## 2021-05-06 DIAGNOSIS — L820 Inflamed seborrheic keratosis: Secondary | ICD-10-CM

## 2021-05-06 DIAGNOSIS — Z1283 Encounter for screening for malignant neoplasm of skin: Secondary | ICD-10-CM

## 2021-05-06 MED ORDER — MOMETASONE FUROATE 0.1 % EX CREA: TOPICAL_CREAM | CUTANEOUS | 0 refills | Status: AC

## 2021-05-06 NOTE — Patient Instructions (Addendum)
Cryotherapy Aftercare  . Wash gently with soap and water everyday.   Marland Kitchen Apply Vaseline and Band-Aid daily until healed.    Pre-Operative Instructions  You are scheduled for a surgical procedure at [facility]. We recommend you read the following instructions. If you have any questions or concerns, please call the office at [phone].  1. Shower and wash the entire body with soap and water the day of your surgery paying special attention to cleansing at and around the planned surgery site.  2. Avoid aspirin or aspirin containing products at least fourteen (14) days prior to your surgical procedure and for at least one week (7 Days) after your surgical procedure. If you take aspirin on a regular basis for heart disease or history of stroke or for any other reason, we may recommend you continue taking aspirin but please notify us if you take this on a regular basis. Aspirin can cause more bleeding to occur during surgery as well as prolonged bleeding and bruising after surgery.   3. Avoid other nonsteroidal pain medications at least one week prior to surgery and at least one week prior to your surgery. These include medications such as Ibuprofen (Motrin, Advil and Nuprin), Naprosyn, Voltaren, Relafen, etc. If medications are used for therapeutic reasons, please inform us as they can cause increased bleeding or prolonged bleeding during and bruising after surgical procedures.   4. Please advise [place] if you are taking any "blood thinner" medications such as Coumadin or Dipyridamole or Plavix or similar medications. These cause increased bleeding and prolonged bleeding during procedures and bruising after surgical procedures. We may have to consider discontinuing these medications briefly prior to and shortly after your surgery if safe to do so.   5. Please inform us of all medications you are currently taking. All medications that are taken regularly should be taken the day of surgery as you  always do. Nevertheless, we need to be informed of what medications you are taking prior to surgery to know whether they will affect the procedure or cause any complications.   6. Please inform us of any medication allergies. Also inform us of whether you have allergies to Latex or rubber products or whether you have had any adverse reaction to Lidocaine or Epinephrine.  7. Please inform us of any prosthetic or artificial body parts such as artificial heart valve, joint replacements, etc., or similar condition that might require preoperative antibiotics.   8. We recommend avoidance of alcohol at least two weeks prior to surgery and continued avoidance for at least two weeks after surgery.   9. We recommend discontinuation of tobacco smoking at least two weeks prior to surgery and continued abstinence for at least two weeks after surgery.  10. Do not plan strenuous exercise, strenuous work or strenuous lifting for approximately four weeks after your surgery.   11. We request if you are unable to make your scheduled surgical appointment, please call us at least a week in advance or as soon as you are aware of a problem so that we can cancel or reschedule the appointment.   12. You MAY TAKE TYLENOL (acetaminophen) for pain as it is not a blood thinner.   13. PLEASE PLAN TO BE IN TOWN FOR TWO WEEKS FOLLOWING SURGERY, THIS IS IMPORTANT SO YOU CAN BE CHECKED FOR DRESSING CHANGES, SUTURE REMOVAL AND TO MONITOR FOR POSSIBLE COMPLICATIONS.     If you have any questions or concerns for your doctor, please call our main line at  [phone] and press option 4 to reach your doctor's medical assistant. If no one answers, please leave a voicemail as directed and we will return your call as soon as possible. Messages left after 4 pm will be answered the following business day.   You may also send [place] a message via [place]. We typically respond to MyChart messages within 1-2 business days.  For prescription  refills, please ask your pharmacy to contact our office. Our fax number is [phone].  If you have an urgent issue when the clinic is closed that cannot wait until the next business day, you can page your doctor at the number below.    Please note that while we do our best to be available for urgent issues outside of office hours, we are not available 24/7.   If you have an urgent issue and are unable to reach [place], you may choose to seek medical care at your doctor's office, retail clinic, urgent care center, or emergency room.  If you have a medical emergency, please immediately call 911 or go to the emergency department.  Pager Numbers  - Dr. Nehemiah Massed: [phone]  - Dr. Laurence Ferrari: [phone]  - Dr. Nicole Kindred: [phone]  In the event of inclement weather, please call our main line at [phone] for an update on the status of any delays or closures.  Dermatology Medication Tips: Please keep the boxes that topical medications come in in order to help keep track of the instructions about where and how to use these. Pharmacies typically print the medication instructions only on the boxes and not directly on the medication tubes.   If your medication is too expensive, please contact our office at [phone] option 4 or send [place] a message through [place].   We are unable to tell what your co-pay for medications will be in advance as this is different depending on your insurance coverage. However, we may be able to find a substitute medication at lower cost or fill out paperwork to get insurance to cover a needed medication.   If a prior authorization is required to get your medication covered by your insurance company, please allow [place] 1-2 business days to complete this process.  Drug prices often vary depending on where the prescription is filled and some pharmacies may offer cheaper prices.  The website www.goodrx.com contains coupons for medications through different pharmacies. The  prices here do not account for what the cost may be with help from insurance (it may be cheaper with your insurance), but the website can give you the price if you did not use any insurance.  - You can print the associated coupon and take it with your prescription to the pharmacy.  - You may also stop by our office during regular business hours and pick up a GoodRx coupon card.  - If you need your prescription sent electronically to a different pharmacy, notify our office through [facility] or by phone at [phone] option 4.

## 2021-05-06 NOTE — Progress Notes (Signed)
Follow-Up Visit   Subjective  Alexandria Moreno is a 80 y.o. female who presents for the following: Follow-up (Patient here for 6 month follow-up TBSE. She has a history of BCCs and SCCs. No spots of concern today.).  She has a few spots that are raised and scaly and get irritated.   The following portions of the chart were reviewed this encounter and updated as appropriate:       Review of Systems:  No other skin or systemic complaints except as noted in HPI or Assessment and Plan.  Objective  Well appearing patient in no apparent distress; mood and affect are within normal limits.  A full examination was performed including scalp, head, eyes, ears, nose, lips, neck, chest, axillae, abdomen, back, buttocks, bilateral upper extremities, bilateral lower extremities, hands, feet, fingers, toes, fingernails, and toenails. All findings within normal limits unless otherwise noted below.  Objective  L nasal ala: Well healed scar with no evidence of recurrence.   Objective  cheeks, nose: Mild erythema on malar cheeks; inflammatory papule on right nasal ala.  Objective  Left Neck Anterior to Scar: Sup edge of scar with 4.0 x 2.31mm pink pearly macule  Objective  left forearm x 1, right forearm x 2, R upper arm x 1, L zygoma x 2, R spinal mid back at braline x 1 (7): Erythematous keratotic or waxy stuck-on papule    Assessment & Plan   Skin cancer screening performed today.  Actinic Damage - chronic, secondary to cumulative UV radiation exposure/sun exposure over time - diffuse scaly erythematous macules with underlying dyspigmentation - Recommend daily broad spectrum sunscreen SPF 30+ to sun-exposed areas, reapply every 2 hours as needed.  - Recommend staying in the shade or wearing long sleeves, sun glasses (UVA+UVB protection) and wide brim hats (4-inch brim around the entire circumference of the hat). - Call for new or changing lesions. -Will consider 5FU/Calcipotriene Cream in  the fall to chest.  History of Basal Cell Carcinoma of the Skin - No evidence of recurrence today - Recommend regular full body skin exams - Recommend daily broad spectrum sunscreen SPF 30+ to sun-exposed areas, reapply every 2 hours as needed.  - Call if any new or changing lesions are noted between office visits  History of Squamous Cell Carcinoma of the Skin - No evidence of recurrence today - Recommend regular full body skin exams - Recommend daily broad spectrum sunscreen SPF 30+ to sun-exposed areas, reapply every 2 hours as needed.  - Call if any new or changing lesions are noted between office visits  Seborrheic Keratoses - Stuck-on, waxy, tan-brown papules and/or plaques  - Benign-appearing - Discussed benign etiology and prognosis. - Observe - Call for any changes  Lentigines - Scattered tan macules - Due to sun exposure - Benign-appering, observe - Recommend daily broad spectrum sunscreen SPF 30+ to sun-exposed areas, reapply every 2 hours as needed. - Call for any changes  Hemangiomas - Red papules - Discussed benign nature - Observe - Call for any changes  Melanocytic Nevi - Tan-brown and/or pink-flesh-colored symmetric macules and papules - Benign appearing on exam today - Observation - Call clinic for new or changing moles - Recommend daily use of broad spectrum spf 30+ sunscreen to sun-exposed areas.   History of basal cell carcinoma (BCC) L nasal ala  Resolved with radiation treatments.  Clear. Observe for recurrence. Call clinic for new or changing lesions.  Recommend regular skin exams, daily broad-spectrum spf 30+ sunscreen use, and photoprotection.  Rosacea cheeks, nose  Continue Finacea gel to face qd. Pt has.  Rosacea is a chronic progressive skin condition usually affecting the face of adults, causing redness and/or acne bumps. It is treatable but not curable. It sometimes affects the eyes (ocular rosacea) as well. It may respond to  topical and/or systemic medication and can flare with stress, sun exposure, alcohol, exercise and some foods.  Daily application of broad spectrum spf 30+ sunscreen to face is recommended to reduce flares.   Basal cell carcinoma (BCC) of skin of neck Left Neck Anterior to Scar  Biopsy proven 05/01/2020. Recurrent.  Will schedule for excision.  Inflamed seborrheic keratosis (7) left forearm x 1, right forearm x 2, R upper arm x 1, L zygoma x 2, R spinal mid back at braline x 1  Prior to procedure, discussed risks of blister formation, small wound, skin dyspigmentation, or rare scar following cryotherapy.   Start mometasone cream qd/bid to AA lower legs until improved dsp 45g 0Rf.  Topical steroids (such as triamcinolone, fluocinolone, fluocinonide, mometasone, clobetasol, halobetasol, betamethasone, hydrocortisone) can cause thinning and lightening of the skin if they are used for too long in the same area. Your physician has selected the right strength medicine for your problem and area affected on the body. Please use your medication only as directed by your physician to prevent side effects.    Destruction of lesion - left forearm x 1, right forearm x 2, R upper arm x 1, L zygoma x 2, R spinal mid back at braline x 1  Destruction method: cryotherapy   Informed consent: discussed and consent obtained   Lesion destroyed using liquid nitrogen: Yes   Region frozen until ice ball extended beyond lesion: Yes   Outcome: patient tolerated procedure well with no complications   Post-procedure details: wound care instructions given    mometasone (ELOCON) 0.1 % cream - left forearm x 1, right forearm x 2, R upper arm x 1, L zygoma x 2, R spinal mid back at braline x 1  Return for surgery of recurrent BCC of the left neck ant to scar.   IJamesetta Orleans, CMA, am acting as scribe for Brendolyn Patty, MD .  Documentation: I have reviewed the above documentation for accuracy and completeness, and I  agree with the above.  Brendolyn Patty MD

## 2021-06-07 ENCOUNTER — Encounter: Payer: Self-pay | Admitting: Cardiology

## 2021-06-07 ENCOUNTER — Ambulatory Visit (INDEPENDENT_AMBULATORY_CARE_PROVIDER_SITE_OTHER): Payer: Medicare Other | Admitting: Cardiology

## 2021-06-07 ENCOUNTER — Other Ambulatory Visit: Payer: Self-pay

## 2021-06-07 ENCOUNTER — Ambulatory Visit (INDEPENDENT_AMBULATORY_CARE_PROVIDER_SITE_OTHER): Payer: Medicare Other

## 2021-06-07 ENCOUNTER — Other Ambulatory Visit: Payer: Self-pay | Admitting: Family Medicine

## 2021-06-07 VITALS — BP 136/68 | HR 61 | Ht 64.0 in | Wt 158.0 lb

## 2021-06-07 DIAGNOSIS — Z1231 Encounter for screening mammogram for malignant neoplasm of breast: Secondary | ICD-10-CM

## 2021-06-07 DIAGNOSIS — E7800 Pure hypercholesterolemia, unspecified: Secondary | ICD-10-CM | POA: Diagnosis not present

## 2021-06-07 DIAGNOSIS — I480 Paroxysmal atrial fibrillation: Secondary | ICD-10-CM | POA: Diagnosis not present

## 2021-06-07 NOTE — Patient Instructions (Signed)
Medication Instructions:  Your physician recommends that you continue on your current medications as directed. Please refer to the Current Medication list given to you today.  *If you need a refill on your cardiac medications before your next appointment, please call your pharmacy*   Lab Work: None If you have labs (blood work) drawn today and your tests are completely normal, you will receive your results only by: [place] (if you have MyChart) OR A paper copy in the mail If you have any lab test that is abnormal or we need to change your treatment, we will call you to review the results.   Testing/Procedures:  Your physician has recommended that you wear a Zio monitor for 14 days (XT).   This monitor is a medical device that records the heart's electrical activity. Doctors most often use these monitors to diagnose arrhythmias. Arrhythmias are problems with the speed or rhythm of the heartbeat. The monitor is a small device applied to your chest. You can wear one while you do your normal daily activities. While wearing this monitor if you have any symptoms to push the button and record what you felt. Once you have worn this monitor for the period of time provider prescribed (Usually 14 days), you will return the monitor device in the postage paid box. Once it is returned they will download the data collected and provide [place] with a report which the provider will then review and we will call you with those results. Important tips:  Avoid showering during the first 24 hours of wearing the monitor. Avoid excessive sweating to help maximize wear time. Do not submerge the device, no hot tubs, and no swimming pools. Keep any lotions or oils away from the patch. After 24 hours you may shower with the patch on. Take brief showers with your back facing the shower head.  Do not remove patch once it has been placed because that will interrupt data and decrease adhesive wear time. Push the button  when you have any symptoms and write down what you were feeling. Once you have completed wearing your monitor, remove and place into box which has postage paid and place in your outgoing mailbox.  If for some reason you have misplaced your box then call our office and we can provide another box and/or mail it off for you.   Follow-Up: At [facility], you and your health needs are our priority.  As part of our continuing mission to provide you with exceptional heart care, we have created designated Provider Care Teams.  These Care Teams include your primary Cardiologist (physician) and Advanced Practice Providers (APPs -  Physician Assistants and Nurse Practitioners) who all work together to provide you with the care you need, when you need it.  We recommend signing up for the patient portal called "MyChart".  Sign up information is provided on this After Visit Summary.  MyChart is used to connect with patients for Virtual Visits (Telemedicine).  Patients are able to view lab/test results, encounter notes, upcoming appointments, etc.  Non-urgent messages can be sent to your provider as well.   To learn more about what you can do with MyChart, go to [email or web address].    Your next appointment:   After testing--4 weeks  The format for your next appointment:   In Person  Provider:   You may see Kate Sable, MD or one of the following Advanced Practice Providers on your designated Care Team:   Murray Hodgkins, NP Christell Faith, PA-C  Marrianne Mood, PA-C Cadence Kathlen Mody, [place]

## 2021-06-07 NOTE — Progress Notes (Signed)
Cardiology Office Note:    Date:  06/07/2021   ID:  Alexandria Moreno, DOB 07-10-1941, MRN [id number]  PCP:  Ria Bush, MD  Cardiologist:  Kate Sable, MD  Electrophysiologist:  None   Referring MD: Ria Bush, MD   Chief Complaint  Patient presents with   Follow-up    Pt states she has been having more Sx--more AFIB and increased HR, causes pain in back, neck, and shoulder. Has been happening about everyday in the last 2 weeks, lasting 45 minutes-2 or more hours. States she has been under stress d/t her husband having Pakinson's.     History of Present Illness:    Alexandria Moreno is a 80 y.o. female with a hx of paroxysmal atrial fibrillation, hyperlipidemia who presents for follow-up.   Patient presents due to worsening palpitations over the past 2 weeks.  States having more episodes of A. fib.  She has a cardia EKG device which tells her when she is in A. fib.  Occasionally her heart rates go up to 190 or her smart watch associated with neck and back pain.  Typically she is nonexertional, such as just ironing.  Attributes some of her symptoms to stress due to husband recently diagnosed with Parkinson's.  She takes Eliquis, Toprol and Multaq as prescribed.   Prior notes She did establish care with the A. fib clinic where Multaq was started.  echocardiogram 09/2019 showed normal systolic and diastolic function,  Lexiscan Myoview 10/2019 with no evidence for ischemia.     Past Medical History:  Diagnosis Date   Basal cell carcinoma 05/22/2015   R nasal ala   Basal cell carcinoma 07/21/2017   L upper arm, L neck, L medial shoulder   Basal cell carcinoma 09/21/2018   R shoulder   Basal cell carcinoma 05/01/2020   L lower neck    Basal cell carcinoma 05/01/2020   L neck ant to scar    Basal cell carcinoma 11/05/2020   L nasal ala, refer for Radiation   Diastolic dysfunction    a. 09/2019 Echo: EF 60-65%, nl RV size/fxn. Nl Atrial sizes. Triv TR. Mild AI; b.  11/2020 Echo: EF 60-65%, no rwma, Gr2DD, nl RV size/fxn, RVSP 34.49mmHg. Mild MR/AI.   Diverticulosis    DJD (degenerative joint disease) of knee    GERD (gastroesophageal reflux disease)    H/o Iron deficiency anemia 2009   a. 05/2008 EGD/Colonoscopy: nl w/o evidence of bleeding; b. 11/2017 EGD: nl. Colonoscopy: 65mm cecal polyp, otw nl.   H/O transfusion of whole blood 2009   H/O: rheumatic fever    80 years old   History of chicken pox    History of stress test    a. 10/2019 EF 53%, no ischemia/infarct. No significant cor Ca2+. Mild thoracic desc Ao atherosclerosis.   Hx of basal cell carcinoma 2010   L dorsum tip of nose (MOHS), R ant alar groove (MOHS)   Hx of squamous cell carcinoma 11/01/2018   SCC is L upper forehead   Hyperlipidemia    Metatarsal fracture 09/2020   L 5th shaft s/p conservative management [place])   Migraines    PAF (paroxysmal atrial fibrillation) ([place])    a. 08/2019 Zio: Avg HR 60 (min 43, max 144). PAF (2% burden), longest 1h 19m (avg rate 77). Rare PAC's and PVC's.   Palpitation    Pelvic prolapse    with rectocele   Rosacea    Seasonal allergic rhinitis    mild   Squamous cell carcinoma  of skin 09/21/2018   L upper forehead   Stress incontinence     Past Surgical History:  Procedure Laterality Date   ABDOMINAL HYSTERECTOMY  1981   APPENDECTOMY  1960   capsule endoscopy  2009   WNL   CATARACT EXTRACTION Bilateral [id number]   COLONOSCOPY  2009   diverticulosis ([place])   COLONOSCOPY WITH PROPOFOL N/A 12/01/2017   TA, rpt 5 yrs ([place])   DEXA  06/2016   T -0.2 hip, 0.7 spine   ESOPHAGOGASTRODUODENOSCOPY  2009   focal mild chronic gastritis, neg H pylori ([place])   ESOPHAGOGASTRODUODENOSCOPY (EGD) WITH PROPOFOL N/A 12/01/2017   Procedure: ESOPHAGOGASTRODUODENOSCOPY (EGD) WITH PROPOFOL;  Surgeon: Lucilla Lame, MD;  Location: [facility] ENDOSCOPY;  Service: Endoscopy;  Laterality: N/A;   INCONTINENCE SURGERY  2001   with mesh   LIPOMA EXCISION  1971    right shoulder   MOHS SURGERY     [facility]   OVARIAN CYST REMOVAL  1960   SALPINGOOPHORECTOMY Left 2001   TONSILLECTOMY  1958    Current Medications: Current Meds  Medication Sig   apixaban (ELIQUIS) 5 MG TABS tablet Take 1 tablet (5 mg total) by mouth 2 (two) times daily.   atorvastatin (LIPITOR) 40 MG tablet Take 1 tablet (40 mg total) by mouth daily.   B COMPLEX VITAMINS PO Take 1 tablet by mouth daily.   chlorhexidine (PERIDEX) 0.12 % solution as needed.    cycloSPORINE (RESTASIS) 0.05 % ophthalmic emulsion Apply 0.05 drops to eye 2 (two) times daily.   diphenoxylate-atropine (LOMOTIL) 2.5-0.025 MG tablet Take by mouth as needed for diarrhea or loose stools.   dronedarone (MULTAQ) 400 MG tablet Take 1 tablet (400 mg total) by mouth 2 (two) times daily with a meal.   glucosamine-chondroitin 500-400 MG tablet Take 1 tablet by mouth 2 (two) times daily.   Methylcellulose, Laxative, (FIBER THERAPY PO) Take 3 tablets by mouth daily.   metoprolol succinate (TOPROL-XL) 25 MG 24 hr tablet Take 0.5 tablets (12.5 mg total) by mouth daily.   mometasone (ELOCON) 0.1 % cream Apply to affected areas lower legs 1-2 times a day until improved.   MULTIPLE VITAMIN PO Take by mouth daily.   omeprazole (PRILOSEC) 40 MG capsule Take 1 capsule (40 mg total) by mouth 2 (two) times daily.   rizatriptan (MAXALT) 10 MG tablet Take 10 mg by mouth daily as needed.   topiramate (TOPAMAX) 50 MG tablet Take 1 tablet (50 mg total) by mouth at bedtime.   VITAMIN D PO Taking one gummy by mouth daily- 5069mcg     Allergies:   Aspirin, Baclofen, Codeine, Imitrex  [sumatriptan], Influenza vaccines, and Vicodin  [hydrocodone-acetaminophen]   Social History   Socioeconomic History   Marital status: Married    Spouse name: Not on file   Number of children: Not on file   Years of education: Not on file   Highest education level: Not on file  Occupational History   Not on file  Tobacco Use   Smoking status: Former     Packs/day: 0.50    Years: 20.00    Pack years: 10.00    Types: Cigarettes    Quit date: 12/15/1978    Years since quitting: 42.5   Smokeless tobacco: Never  Vaping Use   Vaping Use: Never used  Substance and Sexual Activity   Alcohol use: No    Alcohol/week: 0.0 standard drinks   Drug use: No   Sexual activity: Never  Other Topics Concern  Not on file  Social History Narrative   Lives with husband    Occupation: retired, was Art therapist   Edu: some college   Activity: stationary bicycle   Diet: good water, following slim fast diet, good vegetables   Social Determinants of Radio broadcast assistant Strain: Low Risk    Difficulty of Paying Living Expenses: Not hard at all  Food Insecurity: No Food Insecurity   Worried About Charity fundraiser in the Last Year: Never true   Arboriculturist in the Last Year: Never true  Transportation Needs: No Transportation Needs   Lack of Transportation (Medical): No   Lack of Transportation (Non-Medical): No  Physical Activity: Insufficiently Active   Days of Exercise per Week: 1 day   Minutes of Exercise per Session: 60 min  Stress: No Stress Concern Present   Feeling of Stress : Not at all  Social Connections: Not on file     Family History: The patient's family history includes Alcohol abuse in her father; Alzheimer's disease in her maternal grandfather; Cancer in her mother; Dementia in her mother; Depression in her mother; Heart attack in her brother, brother, and father; Heart disease in her father; Hypertension in her brother; Osteoporosis in her father; Stroke in her brother and paternal grandfather; Stroke (age of onset: 53) in her mother; Stroke (age of onset: 59) in her brother. There is no history of Breast cancer.  ROS:   Please see the history of present illness.     All other systems reviewed and are negative.  EKGs/Labs/Other Studies Reviewed:    The following studies were reviewed today:   EKG:  EKG is   ordered today.  The ekg ordered today demonstrates normal sinus rhythm, small old septal infarct   Recent Labs: 08/03/2020: ALT 22; BUN 15; Creatinine, Ser 0.89; Potassium 4.1; Sodium 142; TSH 5.29 10/17/2020: Hemoglobin 15.0; Platelets 143.0  Recent Lipid Panel    Component Value Date/Time   CHOL 141 08/03/2020 0848   CHOL 144 11/19/2015 1119   TRIG 194.0 (H) 08/03/2020 0848   HDL 39.90 08/03/2020 0848   HDL 42 11/19/2015 1119   CHOLHDL 4 08/03/2020 0848   VLDL 38.8 08/03/2020 0848   LDLCALC 62 08/03/2020 0848   LDLCALC 71 11/19/2015 1119    Physical Exam:    VS:  BP 136/68   Pulse 61   Ht 5\' 4"  (1.626 m)   Wt 158 lb (71.7 kg)   BMI 27.12 kg/m     Wt Readings from Last 3 Encounters:  06/07/21 158 lb (71.7 kg)  04/03/21 160 lb 4 oz (72.7 kg)  02/11/21 (P) 157 lb 1.6 oz (71.3 kg)     GEN:  Well nourished, well developed in no acute distress HEENT: Normal NECK: No JVD; No carotid bruits LYMPHATICS: No lymphadenopathy CARDIAC: RRR, no murmurs, rubs, gallops RESPIRATORY:  Clear to auscultation without rales, wheezing or rhonchi  ABDOMEN: Soft, non-tender, non-distended MUSCULOSKELETAL:  No edema; left ankle cast boot noted SKIN: Warm and dry NEUROLOGIC:  Alert and oriented x 3 PSYCHIATRIC:  Normal affect   ASSESSMENT:    1. Paroxysmal atrial fibrillation (HCC)   2. Pure hypercholesterolemia     PLAN:    In order of problems listed above:  paroxysmal atrial fibrillation, currently in sinus rhythm.  Occasional palpitations worsening over the past 2 weeks. CHA2DS2-VASc of 3, continue Multaq, Eliquis, Toprol 12.5 mg daily.  Placed 2-week cardiac monitor to document presence of A. fib and  burden.  If significant burden, consider EP input regarding antiarrhythmic or invasive options.  Her symptoms could be secondary to  stress from husband being diagnosed with Parkinson's.  2.  hyperlipidemia, continue statin as prescribed.    Follow up in 6 weeks after cardiac  monitor  Medication Adjustments/Labs and Tests Ordered: Current medicines are reviewed at length with the patient today.  Concerns regarding medicines are outlined above.  Orders Placed This Encounter  Procedures   LONG TERM MONITOR (3-14 DAYS)   EKG 12-Lead    No orders of the defined types were placed in this encounter.   Patient Instructions  Medication Instructions:  Your physician recommends that you continue on your current medications as directed. Please refer to the Current Medication list given to you today.  *If you need a refill on your cardiac medications before your next appointment, please call your pharmacy*   Lab Work: None If you have labs (blood work) drawn today and your tests are completely normal, you will receive your results only by: [place] (if you have MyChart) OR A paper copy in the mail If you have any lab test that is abnormal or we need to change your treatment, we will call you to review the results.   Testing/Procedures:  Your physician has recommended that you wear a Zio monitor for 14 days (XT).   This monitor is a medical device that records the heart's electrical activity. Doctors most often use these monitors to diagnose arrhythmias. Arrhythmias are problems with the speed or rhythm of the heartbeat. The monitor is a small device applied to your chest. You can wear one while you do your normal daily activities. While wearing this monitor if you have any symptoms to push the button and record what you felt. Once you have worn this monitor for the period of time provider prescribed (Usually 14 days), you will return the monitor device in the postage paid box. Once it is returned they will download the data collected and provide [place] with a report which the provider will then review and we will call you with those results. Important tips:  Avoid showering during the first 24 hours of wearing the monitor. Avoid excessive sweating to help  maximize wear time. Do not submerge the device, no hot tubs, and no swimming pools. Keep any lotions or oils away from the patch. After 24 hours you may shower with the patch on. Take brief showers with your back facing the shower head.  Do not remove patch once it has been placed because that will interrupt data and decrease adhesive wear time. Push the button when you have any symptoms and write down what you were feeling. Once you have completed wearing your monitor, remove and place into box which has postage paid and place in your outgoing mailbox.  If for some reason you have misplaced your box then call our office and we can provide another box and/or mail it off for you.   Follow-Up: At [facility], you and your health needs are our priority.  As part of our continuing mission to provide you with exceptional heart care, we have created designated Provider Care Teams.  These Care Teams include your primary Cardiologist (physician) and Advanced Practice Providers (APPs -  Physician Assistants and Nurse Practitioners) who all work together to provide you with the care you need, when you need it.  We recommend signing up for the patient portal called "MyChart".  Sign up information is provided on  this After Visit Summary.  MyChart is used to connect with patients for Virtual Visits (Telemedicine).  Patients are able to view lab/test results, encounter notes, upcoming appointments, etc.  Non-urgent messages can be sent to your provider as well.   To learn more about what you can do with MyChart, go to [email or web address].    Your next appointment:   After testing--4 weeks  The format for your next appointment:   In Person  Provider:   You may see Kate Sable, MD or one of the following Advanced Practice Providers on your designated Care Team:   Murray Hodgkins, NP Christell Faith, PA-C Marrianne Mood, PA-C Cadence Traff[place], [place]     Signed, Kate Sable, MD  06/07/2021  12:55 PM    [place]

## 2021-06-13 ENCOUNTER — Other Ambulatory Visit: Payer: Self-pay

## 2021-06-13 ENCOUNTER — Encounter: Payer: Self-pay | Admitting: Radiation Oncology

## 2021-06-13 ENCOUNTER — Ambulatory Visit
Admission: RE | Admit: 2021-06-13 | Discharge: 2021-06-13 | Disposition: A | Discharge status: Home / Self Care | Payer: Medicare Other | Source: Ambulatory Visit | Attending: Radiation Oncology | Admitting: Radiation Oncology

## 2021-06-13 VITALS — BP 130/57 | HR 53 | Temp 97.1°F | Wt 159.8 lb

## 2021-06-13 DIAGNOSIS — C44311 Basal cell carcinoma of skin of nose: Secondary | ICD-10-CM

## 2021-06-13 DIAGNOSIS — Z85828 Personal history of other malignant neoplasm of skin: Secondary | ICD-10-CM | POA: Diagnosis not present

## 2021-06-13 DIAGNOSIS — Z08 Encounter for follow-up examination after completed treatment for malignant neoplasm: Secondary | ICD-10-CM | POA: Diagnosis not present

## 2021-06-13 DIAGNOSIS — Z923 Personal history of irradiation: Secondary | ICD-10-CM | POA: Insufficient documentation

## 2021-06-13 NOTE — Progress Notes (Signed)
Radiation Oncology Follow up Note  Name: Alexandria Moreno   Date:   06/13/2021 MRN:  [id number] DOB: 1941-05-18    This 80 y.o. female presents to the clinic today for 83-month follow-up status post electron-beam radiation therapy to her left nasal alla for superficial nodular basal cell carcinoma.  REFERRING PROVIDER: Ria Bush, MD  HPI: Patient is a 80 year old female now out 5 months having completed electron-beam radiation therapy to her left nasal alla for nodular basal cell carcinoma seen today in routine follow-up she is doing well specifically denies any facial pain or discomfort.  She is a new small approximately 3 mm slightly erythematous lesion in the right nasal alla which is being looked at by dermatology.  She has otherwise no complaints has has had an excellent cosmetic result..  COMPLICATIONS OF TREATMENT: none  FOLLOW UP COMPLIANCE: keeps appointments   PHYSICAL EXAM:  BP (!) 130/57 (BP Location: Left Arm, Patient Position: Sitting)   Pulse (!) 53   Temp (!) 97.1 F (36.2 C) (Tympanic)   Wt 159 lb 12.8 oz (72.5 kg)   BMI 27.43 kg/m  Slight erythematous lesion in the right nasal alla otherwise no evidence of disease.  Neck is clear without evidence of cervical adenopathy.  Well-developed well-nourished patient in NAD. HEENT reveals PERLA, EOMI, discs not visualized.  Oral cavity is clear. No oral mucosal lesions are identified. Neck is clear without evidence of cervical or supraclavicular adenopathy. Lungs are clear to A&P. Cardiac examination is essentially unremarkable with regular rate and rhythm without murmur rub or thrill. Abdomen is benign with no organomegaly or masses noted. Motor sensory and DTR levels are equal and symmetric in the upper and lower extremities. Cranial nerves II through XII are grossly intact. Proprioception is intact. No peripheral adenopathy or edema is identified. No motor or sensory levels are noted. Crude visual fields are within normal  range.  RADIOLOGY RESULTS: No current films to review  PLAN: Present time patient is doing well with no evidence of disease.  Dermatology will evaluate the small lesion in the right nasal alla although I believe that area has received sufficient radiation and would not anticipate this to be another basal cell carcinoma.  We have to reevaluate that should that turn out to be positive.  Otherwise have asked to see her back in 6 months and then will turn follow-up care over to dermatology.Patient is to call with any concerns.  I would like to take this opportunity to thank you for allowing me to participate in the care of your patient.Noreene Filbert, MD

## 2021-06-18 ENCOUNTER — Other Ambulatory Visit: Payer: Self-pay | Admitting: Family Medicine

## 2021-06-19 DIAGNOSIS — I480 Paroxysmal atrial fibrillation: Secondary | ICD-10-CM | POA: Diagnosis not present

## 2021-06-24 ENCOUNTER — Other Ambulatory Visit: Payer: Self-pay

## 2021-06-24 ENCOUNTER — Ambulatory Visit (INDEPENDENT_AMBULATORY_CARE_PROVIDER_SITE_OTHER): Payer: Medicare Other | Admitting: Dermatology

## 2021-06-24 DIAGNOSIS — I480 Paroxysmal atrial fibrillation: Secondary | ICD-10-CM | POA: Diagnosis not present

## 2021-06-24 DIAGNOSIS — C4441 Basal cell carcinoma of skin of scalp and neck: Secondary | ICD-10-CM

## 2021-06-24 NOTE — Progress Notes (Signed)
   Follow-Up Visit   Subjective  Alexandria Moreno is a 80 y.o. female who presents for the following: Basal Cell Carcinoma (Biopsy proven - left neck ant to scar - Excise today).   The following portions of the chart were reviewed this encounter and updated as appropriate:        Review of Systems:  No other skin or systemic complaints except as noted in HPI or Assessment and Plan.  Objective  Well appearing patient in no apparent distress; mood and affect are within normal limits.  A focused examination was performed including neck. Relevant physical exam findings are noted in the Assessment and Plan.  Left neck ant to scar 0.6 x 0.4 cm pink papule adjacent to scar   Assessment & Plan  Basal cell carcinoma (BCC) of skin of neck Left neck ant to scar  Skin excision  Lesion length (cm):  0.6 Lesion width (cm):  0.4 Margin per side (cm):  0.3 Total excision diameter (cm):  1.2 Informed consent: discussed and consent obtained   Timeout: patient name, date of birth, surgical site, and procedure verified   Procedure prep:  Patient was prepped and draped in usual sterile fashion Prep type:  Povidone-iodine Anesthesia: the lesion was anesthetized in a standard fashion   Anesthetic:  1% lidocaine w/ epinephrine 1-100,000 buffered w/ 8.4% NaHCO3 (6cc lido with epi and 6cc bupivicaine) Instrument used: #15 blade   Hemostasis achieved with: pressure and electrodesiccation   Outcome: patient tolerated procedure well with no complications    Skin repair Complexity:  Intermediate Final length (cm):  2.7 Informed consent: discussed and consent obtained   Timeout: patient name, date of birth, surgical site, and procedure verified   Reason for type of repair: reduce tension to allow closure, reduce the risk of dehiscence, infection, and necrosis, reduce subcutaneous dead space and avoid a hematoma, preserve normal anatomical and functional relationships and enhance both functionality and  cosmetic results   Undermining: edges undermined   Subcutaneous layers (deep stitches):  Suture size:  4-0 Suture type: Vicryl (polyglactin 910)   Subcutaneous suture technique: inverted dermal. Fine/surface layer approximation (top stitches):  Suture size:  4-0 Suture type: nylon   Stitches: simple interrupted   Suture removal (days):  7 Hemostasis achieved with: suture Outcome: patient tolerated procedure well with no complications   Post-procedure details: sterile dressing applied and wound care instructions given   Dressing type: pressure dressing (Mupirocin ointment)    Specimen 1 - Surgical pathology Differential Diagnosis: Biopsy proven BCC Check Margins: Yes [id number] Posterior at 12'oclock  Return in about 1 week (around 07/01/2021) for suture removal.  I, Ashok Cordia, CMA, am acting as scribe for Brendolyn Patty, MD .  Documentation: I have reviewed the above documentation for accuracy and completeness, and I agree with the above.  Brendolyn Patty MD

## 2021-06-24 NOTE — Patient Instructions (Signed)

## 2021-07-01 ENCOUNTER — Other Ambulatory Visit: Payer: Self-pay

## 2021-07-01 ENCOUNTER — Ambulatory Visit: Payer: Medicare Other

## 2021-07-01 DIAGNOSIS — Z85828 Personal history of other malignant neoplasm of skin: Secondary | ICD-10-CM

## 2021-07-01 NOTE — Patient Instructions (Signed)

## 2021-07-01 NOTE — Progress Notes (Signed)
   Follow-Up Visit   Subjective  Alexandria Moreno is a 80 y.o. female who presents for the following: Suture / Staple Removal (Left neck anterior to scar ). Biopsy proven recurrent BCC 05/01/2020    The following portions of the chart were reviewed this encounter and updated as appropriate:       Review of Systems:  No other skin or systemic complaints except as noted in HPI or Assessment and Plan.  Objective  Well appearing patient in no apparent distress; mood and affect are within normal limits.  A focused examination was performed including left neck . Relevant physical exam findings are noted in the Assessment and Plan.  left neck anterior to scar Well healed scar    Assessment & Plan  History of basal cell carcinoma (BCC) left neck anterior to scar  Encounter for Removal of Sutures - Incision site at the left neck anterior to scar is clean, dry and intact - Wound cleansed, sutures removed, wound cleansed and steri strips applied.    - Patient advised to keep steri-strips dry until they fall off. - Scars remodel for a full year. - Once steri-strips fall off, patient can apply over-the-counter silicone scar cream each night to help with scar remodeling if desired. - Patient advised to call with any concerns or if they notice any new or changing lesions.   Return in about 3 months (around 10/01/2021) for hx of BCC .  I, Marye Round, CMA, am acting as scribe for [employer] .

## 2021-07-03 ENCOUNTER — Telehealth: Payer: Self-pay

## 2021-07-03 NOTE — Telephone Encounter (Signed)
Patient advised BCC excision showed margins free./js

## 2021-07-03 NOTE — Telephone Encounter (Signed)
-----   Message from Brendolyn Patty, MD sent at 07/03/2021 12:15 PM EDT ----- Skin (M), left neck ant to scar EXCISION, RESIDUAL BASAL CELL CARCINOMA, MARGINS FREE   - please call patient

## 2021-07-05 ENCOUNTER — Encounter: Payer: Self-pay | Admitting: Family Medicine

## 2021-07-09 NOTE — Telephone Encounter (Signed)
See husband's chart.

## 2021-07-15 ENCOUNTER — Encounter: Payer: Self-pay | Admitting: Cardiology

## 2021-07-15 ENCOUNTER — Other Ambulatory Visit: Payer: Self-pay

## 2021-07-15 ENCOUNTER — Ambulatory Visit (INDEPENDENT_AMBULATORY_CARE_PROVIDER_SITE_OTHER): Payer: Medicare Other | Admitting: Cardiology

## 2021-07-15 VITALS — BP 136/68 | HR 57 | Ht 64.0 in | Wt 160.0 lb

## 2021-07-15 DIAGNOSIS — E7800 Pure hypercholesterolemia, unspecified: Secondary | ICD-10-CM

## 2021-07-15 DIAGNOSIS — I480 Paroxysmal atrial fibrillation: Secondary | ICD-10-CM

## 2021-07-15 NOTE — Patient Instructions (Addendum)
Medication Instructions:  - Your physician recommends that you continue on your current medications as directed. Please refer to the Current Medication list given to you today.  *If you need a refill on your cardiac medications before your next appointment, please call your pharmacy*   Lab Work: - none ordered  If you have labs (blood work) drawn today and your tests are completely normal, you will receive your results only by: [place] (if you have MyChart) OR A paper copy in the mail If you have any lab test that is abnormal or we need to change your treatment, we will call you to review the results.   Testing/Procedures: - You have been referred to : Dr. Lars Mage- for further management of atrial fibrillation   Follow-Up: At [facility], you and your health needs are our priority.  As part of our continuing mission to provide you with exceptional heart care, we have created designated Provider Care Teams.  These Care Teams include your primary Cardiologist (physician) and Advanced Practice Providers (APPs -  Physician Assistants and Nurse Practitioners) who all work together to provide you with the care you need, when you need it.  We recommend signing up for the patient portal called "MyChart".  Sign up information is provided on this After Visit Summary.  MyChart is used to connect with patients for Virtual Visits (Telemedicine).  Patients are able to view lab/test results, encounter notes, upcoming appointments, etc.  Non-urgent messages can be sent to your provider as well.   To learn more about what you can do with MyChart, go to [email or web address].    Your next appointment:   6 months   The format for your next appointment:   In Person  Provider:   Kate Sable, MD   Other Instructions N/a

## 2021-07-15 NOTE — Progress Notes (Signed)
Cardiology Office Note:    Date:  07/15/2021   ID:  Alexandria Moreno, DOB 1941-08-22, MRN [id number]  PCP:  Ria Bush, MD  Cardiologist:  Kate Sable, MD  Electrophysiologist:  None   Referring MD: Ria Bush, MD   Chief Complaint  Patient presents with   Other    Follow up post Zio -- Patient c.o having more Afib spells. Meds reviewed verbally with patient.     History of Present Illness:    Alexandria Moreno is a 80 y.o. female with a hx of paroxysmal atrial fibrillation, hyperlipidemia who presents for follow-up.   Last seen due to worsening palpitations and concerns for more A. fib spells.  A 2-week cardiac monitor was placed to document atrial fibrillation burden.  Currently on Toprol-XL, Multaq, Eliquis.  She still has episodes of irregular heartbeats and atrial fibrillation.  States she did not feel much of it when she had the monitor on.  She has a cardia machine at home, noted episodes of atrial fibrillation up to 4 hours, associated with weakness, shortness of breath, generalized malaise.  She denies palpitations but notes when she goes into A. fib.   Prior notes She did establish care with the A. fib clinic where Multaq was started.  echocardiogram 09/2019 showed normal systolic and diastolic function,  Lexiscan Myoview 10/2019 with no evidence for ischemia.     Past Medical History:  Diagnosis Date   Basal cell carcinoma 05/22/2015   R nasal ala   Basal cell carcinoma 07/21/2017   L upper arm, L neck, L medial shoulder   Basal cell carcinoma 09/21/2018   R shoulder   Basal cell carcinoma 05/01/2020   L lower neck    Basal cell carcinoma 05/01/2020   L neck ant to scar    Basal cell carcinoma 11/05/2020   L nasal ala, refer for Radiation   Diastolic dysfunction    a. 09/2019 Echo: EF 60-65%, nl RV size/fxn. Nl Atrial sizes. Triv TR. Mild AI; b. 11/2020 Echo: EF 60-65%, no rwma, Gr2DD, nl RV size/fxn, RVSP 34.4mmHg. Mild MR/AI.   Diverticulosis     DJD (degenerative joint disease) of knee    GERD (gastroesophageal reflux disease)    H/o Iron deficiency anemia 2009   a. 05/2008 EGD/Colonoscopy: nl w/o evidence of bleeding; b. 11/2017 EGD: nl. Colonoscopy: 81mm cecal polyp, otw nl.   H/O transfusion of whole blood 2009   H/O: rheumatic fever    80 years old   History of chicken pox    History of stress test    a. 10/2019 EF 53%, no ischemia/infarct. No significant cor Ca2+. Mild thoracic desc Ao atherosclerosis.   Hx of basal cell carcinoma 2010   L dorsum tip of nose (MOHS), R ant alar groove (MOHS)   Hx of squamous cell carcinoma 11/01/2018   SCC is L upper forehead   Hyperlipidemia    Metatarsal fracture 09/2020   L 5th shaft s/p conservative management [place])   Migraines    PAF (paroxysmal atrial fibrillation) ([place])    a. 08/2019 Zio: Avg HR 60 (min 43, max 144). PAF (2% burden), longest 1h 9m (avg rate 77). Rare PAC's and PVC's.   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   L upper forehead   Stress incontinence     Past Surgical History:  Procedure Laterality Date  ABDOMINAL HYSTERECTOMY  1981   APPENDECTOMY  1960   capsule endoscopy  2009   WNL   CATARACT EXTRACTION Bilateral 3557,3220   COLONOSCOPY  2009   diverticulosis ([place])   COLONOSCOPY WITH PROPOFOL N/A 12/01/2017   TA, rpt 5 yrs ([place])   DEXA  06/2016   T -0.2 hip, 0.7 spine   ESOPHAGOGASTRODUODENOSCOPY  2009   focal mild chronic gastritis, neg H pylori ([place])   ESOPHAGOGASTRODUODENOSCOPY (EGD) WITH PROPOFOL N/A 12/01/2017   Procedure: ESOPHAGOGASTRODUODENOSCOPY (EGD) WITH PROPOFOL;  Surgeon: Lucilla Lame, MD;  Location: [facility] ENDOSCOPY;  Service: Endoscopy;  Laterality: N/A;   INCONTINENCE SURGERY  2001   with mesh   LIPOMA EXCISION  1971   right shoulder   MOHS SURGERY     [facility]   OVARIAN CYST REMOVAL  1960   SALPINGOOPHORECTOMY Left 2001    TONSILLECTOMY  1958    Current Medications: Current Meds  Medication Sig   apixaban (ELIQUIS) 5 MG TABS tablet Take 1 tablet (5 mg total) by mouth 2 (two) times daily.   atorvastatin (LIPITOR) 40 MG tablet Take 1 tablet (40 mg total) by mouth daily.   B COMPLEX VITAMINS PO Take 1 tablet by mouth daily.   chlorhexidine (PERIDEX) 0.12 % solution as needed.    cycloSPORINE (RESTASIS) 0.05 % ophthalmic emulsion Apply 0.05 drops to eye 2 (two) times daily.   diphenoxylate-atropine (LOMOTIL) 2.5-0.025 MG tablet Take by mouth as needed for diarrhea or loose stools.   dronedarone (MULTAQ) 400 MG tablet Take 1 tablet (400 mg total) by mouth 2 (two) times daily with a meal.   glucosamine-chondroitin 500-400 MG tablet Take 1 tablet by mouth 2 (two) times daily.   Methylcellulose, Laxative, (FIBER THERAPY PO) Take 3 tablets by mouth daily.   metoprolol succinate (TOPROL-XL) 25 MG 24 hr tablet Take 0.5 tablet (12.5 mg) by mouth once daily   mometasone (ELOCON) 0.1 % cream Apply to affected areas lower legs 1-2 times a day until improved.   MULTIPLE VITAMIN PO Take by mouth daily.   omeprazole (PRILOSEC) 40 MG capsule Take 1 capsule (40 mg total) by mouth 2 (two) times daily.   rizatriptan (MAXALT) 10 MG tablet Take 10 mg by mouth daily as needed.   topiramate (TOPAMAX) 50 MG tablet Take 1 tablet (50 mg total) by mouth at bedtime.   VITAMIN D PO Taking one gummy by mouth daily- 5040mcg   [DISCONTINUED] metoprolol succinate (TOPROL-XL) 25 MG 24 hr tablet TAKE 1 TABLET DAILY     Allergies:   Aspirin, Baclofen, Codeine, Imitrex  [sumatriptan], Influenza vaccines, and Vicodin  [hydrocodone-acetaminophen]   Social History   Socioeconomic History   Marital status: Married    Spouse name: Not on file   Number of children: Not on file   Years of education: Not on file   Highest education level: Not on file  Occupational History   Not on file  Tobacco Use   Smoking status: Former    Packs/day:  0.50    Years: 20.00    Pack years: 10.00    Types: Cigarettes    Quit date: 12/15/1978    Years since quitting: 42.6   Smokeless tobacco: Never  Vaping Use   Vaping Use: Never used  Substance and Sexual Activity   Alcohol use: No    Alcohol/week: 0.0 standard drinks   Drug use: No   Sexual activity: Never  Other Topics Concern   Not on file  Social History Narrative   Lives  with husband    Occupation: retired, was Art therapist   Edu: some college   Activity: stationary bicycle   Diet: good water, following slim fast diet, good vegetables   Social Determinants of Radio broadcast assistant Strain: Low Risk    Difficulty of Paying Living Expenses: Not hard at all  Food Insecurity: No Food Insecurity   Worried About Charity fundraiser in the Last Year: Never true   Arboriculturist in the Last Year: Never true  Transportation Needs: No Transportation Needs   Lack of Transportation (Medical): No   Lack of Transportation (Non-Medical): No  Physical Activity: Insufficiently Active   Days of Exercise per Week: 1 day   Minutes of Exercise per Session: 60 min  Stress: No Stress Concern Present   Feeling of Stress : Not at all  Social Connections: Not on file     Family History: The patient's family history includes Alcohol abuse in her father; Alzheimer's disease in her maternal grandfather; Cancer in her mother; Dementia in her mother; Depression in her mother; Heart attack in her brother, brother, and father; Heart disease in her father; Hypertension in her brother; Osteoporosis in her father; Stroke in her brother and paternal grandfather; Stroke (age of onset: 4) in her mother; Stroke (age of onset: 61) in her brother. There is no history of Breast cancer.  ROS:   Please see the history of present illness.     All other systems reviewed and are negative.  EKGs/Labs/Other Studies Reviewed:    The following studies were reviewed today:   EKG:  EKG is  ordered today.   The ekg ordered today demonstrates normal sinus rhythm, possible old septal infarct   Recent Labs: 08/03/2020: ALT 22; BUN 15; Creatinine, Ser 0.89; Potassium 4.1; Sodium 142; TSH 5.29 10/17/2020: Hemoglobin 15.0; Platelets 143.0  Recent Lipid Panel    Component Value Date/Time   CHOL 141 08/03/2020 0848   CHOL 144 11/19/2015 1119   TRIG 194.0 (H) 08/03/2020 0848   HDL 39.90 08/03/2020 0848   HDL 42 11/19/2015 1119   CHOLHDL 4 08/03/2020 0848   VLDL 38.8 08/03/2020 0848   LDLCALC 62 08/03/2020 0848   LDLCALC 71 11/19/2015 1119    Physical Exam:    VS:  BP 136/68 (BP Location: Left Arm, Patient Position: Sitting, Cuff Size: Normal)   Pulse (!) 57   Ht 5\' 4"  (1.626 m)   Wt 160 lb (72.6 kg)   SpO2 97%   BMI 27.46 kg/m     Wt Readings from Last 3 Encounters:  07/15/21 160 lb (72.6 kg)  06/13/21 159 lb 12.8 oz (72.5 kg)  06/07/21 158 lb (71.7 kg)     GEN:  Well nourished, well developed in no acute distress HEENT: Normal NECK: No JVD; No carotid bruits LYMPHATICS: No lymphadenopathy CARDIAC: RRR, no murmurs, rubs, gallops RESPIRATORY:  Clear to auscultation without rales, wheezing or rhonchi  ABDOMEN: Soft, non-tender, non-distended MUSCULOSKELETAL:  No edema; left ankle cast boot noted SKIN: Warm and dry NEUROLOGIC:  Alert and oriented x 3 PSYCHIATRIC:  Normal affect   ASSESSMENT:    1. Paroxysmal atrial fibrillation (HCC)   2. Pure hypercholesterolemia      PLAN:    In order of problems listed above:  paroxysmal atrial fibrillation, currently in sinus rhythm.  Patient is very symptomatic when she goes into atrial fibrillation.  2-week cardiac monitor showed paroxysmal A. fib, 1% burden, CHA2DS2-VASc of 3.  Cardiac monitor did  not document elevated burden, patient denies having symptoms while on monitor.  Continue Multaq, Eliquis, Toprol 12.5 mg daily.  Will refer to electrophysiology for additional antiarrhythmic and or ablation consideration.  Heart rates low  normal, preventing up titration of beta-blocker. 2.  hyperlipidemia, continue Lipitor.  Follow up in 6 months  Medication Adjustments/Labs and Tests Ordered: Current medicines are reviewed at length with the patient today.  Concerns regarding medicines are outlined above.  Orders Placed This Encounter  Procedures   Ambulatory referral to Cardiac Electrophysiology   EKG 12-Lead     No orders of the defined types were placed in this encounter.   Patient Instructions  Medication Instructions:  - Your physician recommends that you continue on your current medications as directed. Please refer to the Current Medication list given to you today.  *If you need a refill on your cardiac medications before your next appointment, please call your pharmacy*   Lab Work: - none ordered  If you have labs (blood work) drawn today and your tests are completely normal, you will receive your results only by: [place] (if you have MyChart) OR A paper copy in the mail If you have any lab test that is abnormal or we need to change your treatment, we will call you to review the results.   Testing/Procedures: - You have been referred to : Dr. Lars Mage- for further management of atrial fibrillation   Follow-Up: At [facility], you and your health needs are our priority.  As part of our continuing mission to provide you with exceptional heart care, we have created designated Provider Care Teams.  These Care Teams include your primary Cardiologist (physician) and Advanced Practice Providers (APPs -  Physician Assistants and Nurse Practitioners) who all work together to provide you with the care you need, when you need it.  We recommend signing up for the patient portal called "MyChart".  Sign up information is provided on this After Visit Summary.  MyChart is used to connect with patients for Virtual Visits (Telemedicine).  Patients are able to view lab/test results, encounter notes,  upcoming appointments, etc.  Non-urgent messages can be sent to your provider as well.   To learn more about what you can do with MyChart, go to [email or web address].    Your next appointment:   6 months   The format for your next appointment:   In Person  Provider:   Kate Sable, MD   Other Instructions N/a   Signed, Kate Sable, MD  07/15/2021 5:06 PM    Home

## 2021-07-16 ENCOUNTER — Ambulatory Visit
Admission: RE | Admit: 2021-07-16 | Discharge: 2021-07-16 | Disposition: A | Discharge status: Home / Self Care | Payer: Medicare Other | Source: Ambulatory Visit | Attending: Family Medicine | Admitting: Family Medicine

## 2021-07-16 DIAGNOSIS — Z1231 Encounter for screening mammogram for malignant neoplasm of breast: Secondary | ICD-10-CM | POA: Diagnosis not present

## 2021-07-23 DIAGNOSIS — H40003 Preglaucoma, unspecified, bilateral: Secondary | ICD-10-CM | POA: Diagnosis not present

## 2021-07-28 ENCOUNTER — Other Ambulatory Visit: Payer: Self-pay | Admitting: Family Medicine

## 2021-07-28 DIAGNOSIS — E785 Hyperlipidemia, unspecified: Secondary | ICD-10-CM

## 2021-07-28 DIAGNOSIS — E038 Other specified hypothyroidism: Secondary | ICD-10-CM

## 2021-07-28 DIAGNOSIS — I480 Paroxysmal atrial fibrillation: Secondary | ICD-10-CM

## 2021-07-30 ENCOUNTER — Other Ambulatory Visit (INDEPENDENT_AMBULATORY_CARE_PROVIDER_SITE_OTHER): Payer: Medicare Other

## 2021-07-30 ENCOUNTER — Ambulatory Visit: Payer: Medicare Other

## 2021-07-30 ENCOUNTER — Other Ambulatory Visit: Payer: Self-pay

## 2021-07-30 DIAGNOSIS — E785 Hyperlipidemia, unspecified: Secondary | ICD-10-CM | POA: Diagnosis not present

## 2021-07-30 DIAGNOSIS — E038 Other specified hypothyroidism: Secondary | ICD-10-CM

## 2021-07-30 DIAGNOSIS — I480 Paroxysmal atrial fibrillation: Secondary | ICD-10-CM

## 2021-07-30 LAB — CBC WITH DIFFERENTIAL/PLATELET
Basophils Absolute: 0 10*3/uL (ref 0.0–0.1)
Basophils Relative: 0.7 % (ref 0.0–3.0)
Eosinophils Absolute: 0.1 10*3/uL (ref 0.0–0.7)
Eosinophils Relative: 3.1 % (ref 0.0–5.0)
HCT: 45 % (ref 36.0–46.0)
Hemoglobin: 15.2 g/dL — ABNORMAL HIGH (ref 12.0–15.0)
Lymphocytes Relative: 29.6 % (ref 12.0–46.0)
Lymphs Abs: 1.2 10*3/uL (ref 0.7–4.0)
MCHC: 33.8 g/dL (ref 30.0–36.0)
MCV: 92.5 fl (ref 78.0–100.0)
Monocytes Absolute: 0.2 10*3/uL (ref 0.1–1.0)
Monocytes Relative: 4.7 % (ref 3.0–12.0)
Neutro Abs: 2.6 10*3/uL (ref 1.4–7.7)
Neutrophils Relative %: 61.9 % (ref 43.0–77.0)
Platelets: 149 10*3/uL — ABNORMAL LOW (ref 150.0–400.0)
RBC: 4.87 Mil/uL (ref 3.87–5.11)
RDW: 13.5 % (ref 11.5–15.5)
WBC: 4.2 10*3/uL (ref 4.0–10.5)

## 2021-07-30 LAB — COMPREHENSIVE METABOLIC PANEL
ALT: 26 U/L (ref 0–35)
AST: 24 U/L (ref 0–37)
Albumin: 4.3 g/dL (ref 3.5–5.2)
Alkaline Phosphatase: 92 U/L (ref 39–117)
BUN: 16 mg/dL (ref 6–23)
CO2: 25 mEq/L (ref 19–32)
Calcium: 9.3 mg/dL (ref 8.4–10.5)
Chloride: 108 mEq/L (ref 96–112)
Creatinine, Ser: 0.92 mg/dL (ref 0.40–1.20)
GFR: 58.92 mL/min — ABNORMAL LOW (ref >60.00)
Glucose, Bld: 87 mg/dL (ref 70–99)
Potassium: 4.6 mEq/L (ref 3.5–5.1)
Sodium: 141 mEq/L (ref 135–145)
Total Bilirubin: 0.6 mg/dL (ref 0.2–1.2)
Total Protein: 6.6 g/dL (ref 6.0–8.3)

## 2021-07-30 LAB — LDL CHOLESTEROL, DIRECT: Direct LDL: 87 mg/dL

## 2021-07-30 LAB — LIPID PANEL
Cholesterol: 162 mg/dL (ref 0–200)
HDL: 44.5 mg/dL (ref >39.00)
NonHDL: 117.89
Total CHOL/HDL Ratio: 4
Triglycerides: 269 mg/dL — ABNORMAL HIGH (ref 0.0–149.0)
VLDL: 53.8 mg/dL — ABNORMAL HIGH (ref 0.0–40.0)

## 2021-07-30 LAB — TSH: TSH: 4.06 u[IU]/mL (ref 0.35–5.50)

## 2021-07-30 LAB — T4, FREE: Free T4: 0.62 ng/dL (ref 0.60–1.60)

## 2021-08-02 ENCOUNTER — Other Ambulatory Visit: Payer: Self-pay | Admitting: Family Medicine

## 2021-08-07 ENCOUNTER — Encounter: Payer: Self-pay | Admitting: Family Medicine

## 2021-08-07 ENCOUNTER — Ambulatory Visit (INDEPENDENT_AMBULATORY_CARE_PROVIDER_SITE_OTHER): Payer: Medicare Other | Admitting: Family Medicine

## 2021-08-07 ENCOUNTER — Other Ambulatory Visit: Payer: Self-pay

## 2021-08-07 VITALS — BP 128/62 | HR 60 | Temp 98.1°F | Ht 64.0 in | Wt 158.0 lb

## 2021-08-07 DIAGNOSIS — I480 Paroxysmal atrial fibrillation: Secondary | ICD-10-CM

## 2021-08-07 DIAGNOSIS — Z85828 Personal history of other malignant neoplasm of skin: Secondary | ICD-10-CM | POA: Diagnosis not present

## 2021-08-07 DIAGNOSIS — E038 Other specified hypothyroidism: Secondary | ICD-10-CM | POA: Diagnosis not present

## 2021-08-07 DIAGNOSIS — K219 Gastro-esophageal reflux disease without esophagitis: Secondary | ICD-10-CM | POA: Diagnosis not present

## 2021-08-07 DIAGNOSIS — Z0000 Encounter for general adult medical examination without abnormal findings: Secondary | ICD-10-CM

## 2021-08-07 DIAGNOSIS — G43909 Migraine, unspecified, not intractable, without status migrainosus: Secondary | ICD-10-CM | POA: Diagnosis not present

## 2021-08-07 DIAGNOSIS — E785 Hyperlipidemia, unspecified: Secondary | ICD-10-CM | POA: Diagnosis not present

## 2021-08-07 NOTE — Assessment & Plan Note (Signed)

## 2021-08-07 NOTE — Progress Notes (Signed)
Patient ID: Alexandria Moreno, female    DOB: [birth year], 80 y.o.   MRN: [id number]  This visit was conducted in person.  BP 128/62   Pulse 60   Temp 98.1 F (36.7 C) (Temporal)   Ht 5\' 4"  (1.626 m)   Wt 158 lb (71.7 kg)   SpO2 98%   BMI 27.12 kg/m    CC: AMW Subjective:   HPI: Alexandria Moreno is a 80 y.o. female presenting on 08/07/2021 for Annual Exam   Did not see health advisor this year.   No results found.  [place] Office Visit from 08/07/2021 in [place] [facility] [place]  PHQ-2 Total Score 0       Fall Risk  08/07/2021 07/27/2020 07/25/2019 07/07/2018 07/01/2017  Falls in the past year? 0 0 1 No No  Number falls in past yr: 0 0 0 - -  Injury with Fall? 0 0 1 - -  Risk for fall due to : - Medication side effect - - -  Follow up - Falls evaluation completed;Falls prevention discussed - - -    PAF on multaq and eliquis and toprol XL 12.5mg  daily. Occasional lightheadedness. To see EP next month for second opinion.   Migraines - sees neurology , on topamax 50mg  bid and maxalt 10mg  PRN.   Basal cell cancer to nostril s/p 5 wks of radiation through Dr Baruch Gouty, Dr Nicole Kindred earlier this year.   Preventative: COLONOSCOPY WITH PROPOFOL 12/01/2017 TA, rpt 5 yrs ([facility]). Had trouble with prep and diarrhea after procedure Mammogram [facility]1 @ [facility] 07/2021 Last well woman Dr Teryl [facility] saw 08/2016. Known h/o rectocele. S/p hysterectomy and ovaries removed for heavy bleeding (1981).  DEXA Date: 06/2016 T -0.2 hip, 0.7 spine  Lung cancer screening - not eligible  Flu ALLERGY  COVID vaccine - Pfizer 01/2020, 02/2020, booster 10/2020  Prevnar 2015, pneumovax 2009,  Td 2009  zostavax 2014  Shingrix - discussed to check with pharmacy  Advanced directives - scanned 07/2016. No life prolonging measures if terminal irreversible condition. Separate HCPOA form filled out - Alexandria Moreno husband then [employer] are [employer]. Scanned 08/2017.  Seat belt use discussed.   Sunscreen use discussed. No changing moles on skin.  Ex smoker - quit 1980.  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 Interim medical history since our last visit reviewed. Allergies and medications reviewed and updated. Outpatient Medications Prior to Visit  Medication Sig Dispense Refill   apixaban (ELIQUIS) 5 MG TABS tablet Take 1 tablet (5 mg total) by mouth 2 (two) times daily. 180 tablet 2   atorvastatin (LIPITOR) 40 MG tablet TAKE 1 TABLET DAILY 90 tablet 0   B COMPLEX VITAMINS PO Take 1 tablet by mouth daily.     chlorhexidine (PERIDEX) 0.12 % solution as needed.   0   cycloSPORINE (RESTASIS) 0.05 % ophthalmic emulsion Apply 0.05 drops to eye 2 (two) times daily.     diphenoxylate-atropine (LOMOTIL) 2.5-0.025 MG tablet Take by mouth as needed for diarrhea or loose stools.  dronedarone (MULTAQ) 400 MG tablet Take 1 tablet (400 mg total) by mouth 2 (two) times daily with a meal. 180 tablet 2   glucosamine-chondroitin 500-400 MG tablet Take 1 tablet by mouth 2 (two) times daily.     Methylcellulose, Laxative, (FIBER THERAPY PO) Take 3 tablets by mouth daily.     metoprolol succinate (TOPROL-XL) 25 MG 24 hr tablet Take 0.5 tablet (12.5 mg) by mouth once daily     mometasone (ELOCON) 0.1 % cream Apply to affected areas lower legs 1-2 times a day until improved. 45 g 0   MULTIPLE VITAMIN PO Take by mouth daily.     omeprazole (PRILOSEC) 40 MG capsule Take 1 capsule (40 mg total) by mouth 2 (two) times daily. 180 capsule 3   rizatriptan (MAXALT) 10 MG tablet Take 10 mg by  mouth daily as needed.     topiramate (TOPAMAX) 50 MG tablet Take 1 tablet (50 mg total) by mouth at bedtime.     VITAMIN D PO Taking one gummy by mouth daily- 5064mcg     No facility-administered medications prior to visit.     Per HPI unless specifically indicated in ROS section below Review of Systems  Objective:  BP 128/62   Pulse 60   Temp 98.1 F (36.7 C) (Temporal)   Ht 5\' 4"  (1.626 m)   Wt 158 lb (71.7 kg)   SpO2 98%   BMI 27.12 kg/m   Wt Readings from Last 3 Encounters:  08/07/21 158 lb (71.7 kg)  07/15/21 160 lb (72.6 kg)  06/13/21 159 lb 12.8 oz (72.5 kg)      Physical Exam Vitals and nursing note reviewed.  Constitutional:      Appearance: Normal appearance. She is not ill-appearing.  HENT:     Head: Normocephalic and atraumatic.     Right Ear: Tympanic membrane, ear canal and external ear normal. There is no impacted cerumen.     Left Ear: Tympanic membrane, ear canal and external ear normal. There is no impacted cerumen.  Eyes:     General:        Right eye: No discharge.        Left eye: No discharge.     Extraocular Movements: Extraocular movements intact.     Conjunctiva/sclera: Conjunctivae normal.     Pupils: Pupils are equal, round, and reactive to light.  Neck:     Thyroid: No thyroid mass or thyromegaly.     Vascular: No carotid bruit.  Cardiovascular:     Rate and Rhythm: Normal rate and regular rhythm.     Pulses: Normal pulses.     Heart sounds: Normal heart sounds. No murmur heard. Pulmonary:     Effort: Pulmonary effort is normal. No respiratory distress.     Breath sounds: Normal breath sounds. No wheezing, rhonchi or rales.  Abdominal:     General: Bowel sounds are normal. There is no distension.     Palpations: Abdomen is soft. There is no mass.     Tenderness: There is no abdominal tenderness. There is no guarding or rebound.     Hernia: No hernia is present.  Musculoskeletal:     Cervical back: Normal range of motion and neck  supple. No rigidity.     Right lower leg: No edema.     Left lower leg: No edema.  Lymphadenopathy:     Cervical: No cervical adenopathy.  Skin:    General: Skin is warm and dry.     Findings:  No rash.  Neurological:     General: No focal deficit present.     Mental Status: She is alert. Mental status is at baseline.     Comments:  Recall 3/3 Calculation 1/5 serial 3s  Psychiatric:        Mood and Affect: Mood normal.        Behavior: Behavior normal.      Results for orders placed or performed in visit on 07/30/21  T4, free  Result Value Ref Range   Free T4 0.62 0.60 - 1.60 ng/dL  TSH  Result Value Ref Range   TSH 4.06 0.35 - 5.50 uIU/mL  CBC with Differential/Platelet  Result Value Ref Range   WBC 4.2 4.0 - 10.5 K/uL   RBC 4.87 3.87 - 5.11 Mil/uL   Hemoglobin 15.2 (H) 12.0 - 15.0 g/dL   HCT 45.0 36.0 - 46.0 %   MCV 92.5 78.0 - 100.0 fl   MCHC 33.8 30.0 - 36.0 g/dL   RDW 13.5 11.5 - 15.5 %   Platelets 149.0 (L) 150.0 - 400.0 K/uL   Neutrophils Relative % 61.9 43.0 - 77.0 %   Lymphocytes Relative 29.6 12.0 - 46.0 %   Monocytes Relative 4.7 3.0 - 12.0 %   Eosinophils Relative 3.1 0.0 - 5.0 %   Basophils Relative 0.7 0.0 - 3.0 %   Neutro Abs 2.6 1.4 - 7.7 K/uL   Lymphs Abs 1.2 0.7 - 4.0 K/uL   Monocytes Absolute 0.2 0.1 - 1.0 K/uL   Eosinophils Absolute 0.1 0.0 - 0.7 K/uL   Basophils Absolute 0.0 0.0 - 0.1 K/uL  Lipid panel  Result Value Ref Range   Cholesterol 162 0 - 200 mg/dL   Triglycerides 269.0 (H) 0.0 - 149.0 mg/dL   HDL 44.50 >39.00 mg/dL   VLDL 53.8 (H) 0.0 - 40.0 mg/dL   Total CHOL/HDL Ratio 4    NonHDL 117.89   Comprehensive metabolic panel  Result Value Ref Range   Sodium 141 135 - 145 mEq/L   Potassium 4.6 3.5 - 5.1 mEq/L   Chloride 108 96 - 112 mEq/L   CO2 25 19 - 32 mEq/L   Glucose, Bld 87 70 - 99 mg/dL   BUN 16 6 - 23 mg/dL   Creatinine, Ser 0.92 0.40 - 1.20 mg/dL   Total Bilirubin 0.6 0.2 - 1.2 mg/dL   Alkaline Phosphatase 92 39 - 117 U/L    AST 24 0 - 37 U/L   ALT 26 0 - 35 U/L   Total Protein 6.6 6.0 - 8.3 g/dL   Albumin 4.3 3.5 - 5.2 g/dL   GFR 58.92 (L) >60.00 mL/min   Calcium 9.3 8.4 - 10.5 mg/dL  LDL cholesterol, direct  Result Value Ref Range   Direct LDL 87.0 mg/dL    Assessment & Plan:  This visit occurred during the SARS-CoV-2 public health emergency.  Safety protocols were in place, including screening questions prior to the visit, additional usage of staff PPE, and extensive cleaning of exam room while observing appropriate contact time as indicated for disinfecting solutions.   Problem List Items Addressed This Visit     Medicare annual wellness visit, subsequent - Primary (Chronic)    I have personally reviewed the Medicare Annual Wellness questionnaire and have noted 1. The patient's medical and social history 2. Their use of alcohol, tobacco or illicit drugs 3. Their current medications and supplements 4. The patient's functional ability including ADL's, fall risks, home safety risks and hearing or visual  impairment. Cognitive function has been assessed and addressed as indicated.  5. Diet and physical activity 6. Evidence for depression or mood disorders The patients weight, height, BMI have been recorded in the chart. I have made referrals, counseling and provided education to the patient based on review of the above and I have provided the pt with a written personalized care plan for preventive services. Provider list updated.. See scanned questionairre as needed for further documentation. Reviewed preventative protocols and updated unless pt declined.       Dyslipidemia    Chronic, LDL stable on atorvastatin however triglyceride levels remain high.  The ASCVD Risk score Mikey Bussing DC Jr., et al., 2013) failed to calculate for the following reasons:   The 2013 ASCVD risk score is only valid for ages 83 to 75       GERD (gastroesophageal reflux disease)    Continues omeprazole 40mg  bid.        Migraine    Followed by neurology - stable period on topamax 50mg  bid with maxalt abortively.       Paroxysmal atrial fibrillation (HCC)    Low burden however very symptomatic with flares. Continue multaq, toprol XL daily.  Sees Dr Garen Lah, planning EP eval by Dr Quentin Ore to discuss further management options.       Subclinical hypothyroidism    TSH high normal, free T4 low normal at 0.62.  She does endorse hypothyroid symptoms of cold intolerance, constipation and unexpected weight gain.  Discussed possible trial of low dose thyroid replacement, however will continue to monitor for now in setting of upcoming EP evaluation for afib.       History of basal cell cancer    To left nostril s/p radiation therapy. Continues seeing derm and rad onc regularly.         No orders of the defined types were placed in this encounter.  No orders of the defined types were placed in this encounter.   Patient instructions: If interested, check with pharmacy about new 2 shot shingles series (shingrix).  Thyroid function remains borderline, along with low thyroid symptoms we could consider low dose thyroid replacement. Touch base with Dr Quentin Ore about effect on afib.  You are doing well today Return as needed or in 1 year for next wellness visit.   Follow up plan: Return in about 1 year (around 08/07/2022) for medicare wellness visit.  Ria Bush, MD

## 2021-08-07 NOTE — Patient Instructions (Addendum)
If interested, check with pharmacy about new 2 shot shingles series (shingrix).  Thyroid function remains borderline, along with low thyroid symptoms we could consider low dose thyroid replacement. Touch base with Dr Quentin Ore about effect on afib.  You are doing well today Return as needed or in 1 year for next wellness visit.   Health Maintenance After Age 80 After age 60, you are at a higher risk for certain long-term diseases and infections as well as injuries from falls. Falls are a major cause of broken bones and head injuries in people who are older than age 47. Getting regular preventive care can help to keep you healthy and well. Preventive care includes getting regular testing and making lifestyle changes as recommended by your health care provider. Talk with your health care provider about: Which screenings and tests you should have. A screening is a test that checks for a disease when you have no symptoms. A diet and exercise plan that is right for you. What should I know about screenings and tests to prevent falls? Screening and testing are the best ways to find a health problem early. Early diagnosis and treatment give you the best chance of managing medical conditions that are common after age 34. Certain conditions and lifestyle choices may make you more likely to have a fall. Your health care provider may recommend: Regular vision checks. Poor vision and conditions such as cataracts can make you more likely to have a fall. If you wear glasses, make sure to get your prescription updated if your vision changes. Medicine review. Work with your health care provider to regularly review all of the medicines you are taking, including over-the-counter medicines. Ask your health care provider about any side effects that may make you more likely to have a fall. Tell your health care provider if any medicines that you take make you feel dizzy or sleepy. Osteoporosis screening. Osteoporosis is a  condition that causes the bones to get weaker. This can make the bones weak and cause them to break more easily. Blood pressure screening. Blood pressure changes and medicines to control blood pressure can make you feel dizzy. Strength and balance checks. Your health care provider may recommend certain tests to check your strength and balance while standing, walking, or changing positions. Foot health exam. Foot pain and numbness, as well as not wearing proper footwear, can make you more likely to have a fall. Depression screening. You may be more likely to have a fall if you have a fear of falling, feel emotionally low, or feel unable to do activities that you used to do. Alcohol use screening. Using too much alcohol can affect your balance and may make you more likely to have a fall. What actions can I take to lower my risk of falls? General instructions Talk with your health care provider about your risks for falling. Tell your health care provider if: You fall. Be sure to tell your health care provider about all falls, even ones that seem minor. You feel dizzy, sleepy, or off-balance. Take over-the-counter and prescription medicines only as told by your health care provider. These include any supplements. Eat a healthy diet and maintain a healthy weight. A healthy diet includes low-fat dairy products, low-fat (lean) meats, and fiber from whole grains, beans, and lots of fruits and vegetables. Home safety Remove any tripping hazards, such as rugs, cords, and clutter. Install safety equipment such as grab bars in bathrooms and safety rails on stairs. Keep rooms and walkways  well-lit. Activity  Follow a regular exercise program to stay fit. This will help you maintain your balance. Ask your health care provider what types of exercise are appropriate for you. If you need a cane or walker, use it as recommended by your health care provider. Wear supportive shoes that have nonskid  soles.  Lifestyle Do not drink alcohol if your health care provider tells you not to drink. If you drink alcohol, limit how much you have: 0-1 drink a day for women. 0-2 drinks a day for men. Be aware of how much alcohol is in your drink. In the [place] one drink equals one typical bottle of beer (12 oz), one-half glass of wine (5 oz), or one shot of hard liquor (1 oz). Do not use any products that contain nicotine or tobacco, such as cigarettes and e-cigarettes. If you need help quitting, ask your health care provider. Summary Having a healthy lifestyle and getting preventive care can help to protect your health and wellness after age 29. Screening and testing are the best way to find a health problem early and help you avoid having a fall. Early diagnosis and treatment give you the best chance for managing medical conditions that are more common for people who are older than age 2. Falls are a major cause of broken bones and head injuries in people who are older than age 49. Take precautions to prevent a fall at home. Work with your health care provider to learn what changes you can make to improve your health and wellness and to prevent falls. This information is not intended to replace advice given to you by your health care provider. Make sure you discuss any questions you have with your healthcare provider. Document Revised: 11/16/2020 Document Reviewed: 11/16/2020 Elsevier Patient Education  2022 [employer].

## 2021-08-08 DIAGNOSIS — Z85828 Personal history of other malignant neoplasm of skin: Secondary | ICD-10-CM | POA: Insufficient documentation

## 2021-08-08 NOTE — Assessment & Plan Note (Addendum)
Chronic, LDL stable on atorvastatin however triglyceride levels remain high.  The ASCVD Risk score Alexandria Bussing DC Jr., et al., 2013) failed to calculate for the following reasons:   The 2013 ASCVD risk score is only valid for ages 77 to 67

## 2021-08-08 NOTE — Assessment & Plan Note (Signed)
TSH high normal, free T4 low normal at 0.62.  She does endorse hypothyroid symptoms of cold intolerance, constipation and unexpected weight gain.  Discussed possible trial of low dose thyroid replacement, however will continue to monitor for now in setting of upcoming EP evaluation for afib.

## 2021-08-08 NOTE — Assessment & Plan Note (Signed)
Continues omeprazole 40mg bid  

## 2021-08-08 NOTE — Assessment & Plan Note (Signed)
To left nostril s/p radiation therapy. Continues seeing derm and rad onc regularly.

## 2021-08-08 NOTE — Assessment & Plan Note (Signed)
Followed by neurology - stable period on topamax 50mg  bid with maxalt abortively.

## 2021-08-08 NOTE — Assessment & Plan Note (Signed)
Low burden however very symptomatic with flares. Continue multaq, toprol XL daily.  Sees Dr Garen Lah, planning EP eval by Dr Quentin Ore to discuss further management options.

## 2021-08-28 ENCOUNTER — Encounter: Payer: Self-pay | Admitting: Cardiology

## 2021-08-28 ENCOUNTER — Ambulatory Visit (INDEPENDENT_AMBULATORY_CARE_PROVIDER_SITE_OTHER): Payer: Medicare Other | Admitting: Cardiology

## 2021-08-28 ENCOUNTER — Other Ambulatory Visit: Payer: Self-pay

## 2021-08-28 ENCOUNTER — Encounter: Payer: Self-pay | Admitting: Family Medicine

## 2021-08-28 VITALS — BP 130/70 | HR 54 | Ht 64.0 in | Wt 158.0 lb

## 2021-08-28 DIAGNOSIS — I480 Paroxysmal atrial fibrillation: Secondary | ICD-10-CM

## 2021-08-28 NOTE — Patient Instructions (Signed)
Medication Instructions:  Your physician recommends that you continue on your current medications as directed. Please refer to the Current Medication list given to you today.  *If you need a refill on your cardiac medications before your next appointment, please call your pharmacy*   Lab Work: None ordered today  If you have labs (blood work) drawn today and your tests are completely normal, you will receive your results only by: [place] (if you have MyChart) OR A paper copy in the mail If you have any lab test that is abnormal or we need to change your treatment, we will call you to review the results.   Testing/Procedures: Your physician has recommended that you have an ablation. Catheter ablation is a medical procedure used to treat some cardiac arrhythmias (irregular heartbeats). During catheter ablation, a long, thin, flexible tube is put into a blood vessel in your groin (upper thigh), or neck. This tube is called an ablation catheter. It is then guided to your heart through the blood vessel. Radio frequency waves destroy small areas of heart tissue where abnormal heartbeats may cause an arrhythmia to start. Please see the instruction sheet given to you today.  Your physician has requested that you have cardiac CT. Cardiac computed tomography (CT) is a painless test that uses an x-ray machine to take clear, detailed pictures of your heart. For further information please visit [email or web address]. Please follow instruction sheet as given.     Follow-Up: At [facility], you and your health needs are our priority.  As part of our continuing mission to provide you with exceptional heart care, we have created designated Provider Care Teams.  These Care Teams include your primary Cardiologist (physician) and Advanced Practice Providers (APPs -  Physician Assistants and Nurse Practitioners) who all work together to provide you with the care you need, when you need it.  We  recommend signing up for the patient portal called "MyChart".  Sign up information is provided on this After Visit Summary.  MyChart is used to connect with patients for Virtual Visits (Telemedicine).  Patients are able to view lab/test results, encounter notes, upcoming appointments, etc.  Non-urgent messages can be sent to your provider as well.   To learn more about what you can do with MyChart, go to [email or web address].    Your next appointment:   To be scheduled

## 2021-08-28 NOTE — Progress Notes (Signed)
Electrophysiology Office Note:    Date:  08/28/2021   ID:  Alexandria Moreno, DOB [birth year], MRN [id number]  PCP:  Alexandria Bush, MD  [facility] Cardiologist:  Alexandria Sable, MD  [facility] Electrophysiologist:  Alexandria Epley, MD   Referring MD: Alexandria Sable, MD   Chief Complaint: Atrial fibrillation  History of Present Illness:    Alexandria Moreno is a 80 y.o. female who presents for an evaluation of atrial fibrillation at the request of Dr. Garen Moreno. Their medical history includes hyperlipidemia and paroxysmal atrial fibrillation.  She was last seen by Dr. Garen Moreno on July 15, 2021.  Her episodes of atrial fibrillation have become more frequent and lasting up to 4 hours at a time.  Episodes are associated with weakness and shortness of breath.  She is also fatigued.  She is maintained on Multaq and Eliquis.  Further up titration of her beta-blockers has been limited because of bradycardia.  She is very active.  She helps take care of her husband who has Parkinson's, dementia and seizures.  She is very motivated to get her atrial fibrillation under better control so she can help better take care of her husband.  Past Medical History:  Diagnosis Date   Basal cell carcinoma 05/22/2015   R nasal ala   Basal cell carcinoma 07/21/2017   L upper arm, L neck, L medial shoulder   Basal cell carcinoma 09/21/2018   R shoulder   Basal cell carcinoma 05/01/2020   L lower neck    Basal cell carcinoma 05/01/2020   L neck ant to scar    Basal cell carcinoma 11/05/2020   L nasal ala, refer for Radiation   Diastolic dysfunction    a. 09/2019 Echo: EF 60-65%, nl RV size/fxn. Nl Atrial sizes. Triv TR. Mild AI; b. 11/2020 Echo: EF 60-65%, no rwma, Gr2DD, nl RV size/fxn, RVSP 34.77mmHg. Mild MR/AI.   Diverticulosis    DJD (degenerative joint disease) of knee    GERD (gastroesophageal reflux disease)    H/o Iron deficiency anemia 2009   a. 05/2008 EGD/Colonoscopy: nl  w/o evidence of bleeding; b. 11/2017 EGD: nl. Colonoscopy: 26mm cecal polyp, otw nl.   H/O transfusion of whole blood 2009   H/O: rheumatic fever    80 years old   History of chicken pox    History of stress test    a. 10/2019 EF 53%, no ischemia/infarct. No significant cor Ca2+. Mild thoracic desc Ao atherosclerosis.   Hx of basal cell carcinoma 2010   L dorsum tip of nose (MOHS), R ant alar groove (MOHS)   Hx of squamous cell carcinoma 11/01/2018   SCC is L upper forehead   Hyperlipidemia    Metatarsal fracture 09/2020   L 5th shaft s/p conservative management Alexandria Moreno)   Migraines    PAF (paroxysmal atrial fibrillation) (Alexandria Moreno)    a. 08/2019 Zio: Avg HR 60 (min 43, max 144). PAF (2% burden), longest 1h 3m (avg rate 77). Rare PAC's and PVC's.   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   L upper forehead   Stress incontinence     Past Surgical History:  Procedure Laterality Date   ABDOMINAL HYSTERECTOMY  1981   APPENDECTOMY  1960   capsule endoscopy  2009   WNL   CATARACT EXTRACTION Bilateral [id number]   COLONOSCOPY  2009   diverticulosis (  Alexandria Moreno)   COLONOSCOPY WITH PROPOFOL N/A 12/01/2017   TA, rpt 5 yrs (Alexandria Moreno)   DEXA  06/2016   T -0.2 hip, 0.7 spine   ESOPHAGOGASTRODUODENOSCOPY  2009   focal mild chronic gastritis, neg H pylori ([place])   ESOPHAGOGASTRODUODENOSCOPY (EGD) WITH PROPOFOL N/A 12/01/2017   Procedure: ESOPHAGOGASTRODUODENOSCOPY (EGD) WITH PROPOFOL;  Surgeon: Alexandria Lame, MD;  Location: [facility] ENDOSCOPY;  Service: Endoscopy;  Laterality: N/A;   INCONTINENCE SURGERY  2001   with mesh   LIPOMA EXCISION  1971   right shoulder   MOHS SURGERY     [facility]   OVARIAN CYST REMOVAL  1960   SALPINGOOPHORECTOMY Left 2001   TONSILLECTOMY  1958    Current Medications: Current Meds  Medication Sig   apixaban (ELIQUIS) 5 MG TABS tablet Take 1 tablet (5 mg total) by  mouth 2 (two) times daily.   atorvastatin (LIPITOR) 40 MG tablet TAKE 1 TABLET DAILY   B COMPLEX VITAMINS PO Take 1 tablet by mouth daily.   chlorhexidine (PERIDEX) 0.12 % solution as needed.    cycloSPORINE (RESTASIS) 0.05 % ophthalmic emulsion Apply 0.05 drops to eye 2 (two) times daily.   diphenoxylate-atropine (LOMOTIL) 2.5-0.025 MG tablet Take by mouth as needed for diarrhea or loose stools.   dronedarone (MULTAQ) 400 MG tablet Take 1 tablet (400 mg total) by mouth 2 (two) times daily with a meal.   glucosamine-chondroitin 500-400 MG tablet Take 1 tablet by mouth 2 (two) times daily.   Methylcellulose, Laxative, (FIBER THERAPY PO) Take 3 tablets by mouth daily.   metoprolol succinate (TOPROL-XL) 25 MG 24 hr tablet Take 0.5 tablet (12.5 mg) by mouth once daily   mometasone (ELOCON) 0.1 % cream Apply to affected areas lower legs 1-2 times a day until improved.   MULTIPLE VITAMIN PO Take by mouth daily.   omeprazole (PRILOSEC) 40 MG capsule Take 1 capsule (40 mg total) by mouth 2 (two) times daily. (Patient taking differently: Take 40 mg by mouth daily.)   rizatriptan (MAXALT) 10 MG tablet Take 10 mg by mouth daily as needed.   topiramate (TOPAMAX) 50 MG tablet Take 1 tablet (50 mg total) by mouth at bedtime.   VITAMIN D PO Taking one gummy by mouth daily- 5061mcg     Allergies:   Aspirin, Baclofen, Codeine, Imitrex  [sumatriptan], Influenza vaccines, and Vicodin  [hydrocodone-acetaminophen]   Social History   Socioeconomic History   Marital status: Married    Spouse name: Not on file   Number of children: Not on file   Years of education: Not on file   Highest education level: Not on file  Occupational History   Not on file  Tobacco Use   Smoking status: Former    Packs/day: 0.50    Years: 20.00    Pack years: 10.00    Types: Cigarettes    Quit date: 12/15/1978    Years since quitting: 42.7   Smokeless tobacco: Never  Vaping Use   Vaping Use: Never used  Substance and Sexual  Activity   Alcohol use: No    Alcohol/week: 0.0 standard drinks   Drug use: No   Sexual activity: Never  Other Topics Concern   Not on file  Social History Narrative   Lives with husband    Occupation: retired, was Art therapist   Edu: some college   Activity: stationary bicycle   Diet: good water, following slim fast diet, good vegetables   Social Determinants of Health   Financial Resource Strain: Not  on file  Food Insecurity: Not on file  Transportation Needs: Not on file  Physical Activity: Not on file  Stress: Not on file  Social Connections: Not on file     Family History: The patient's family history includes Alcohol abuse in her father; Alzheimer's disease in her maternal grandfather; Cancer in her mother; Dementia in her mother; Depression in her mother; Heart attack in her brother, brother, and father; Heart disease in her father; Hypertension in her brother; Osteoporosis in her father; Stroke in her brother and paternal grandfather; Stroke (age of onset: 22) in her mother; Stroke (age of onset: 74) in her brother. There is no history of Breast cancer.  ROS:   Please see the history of present illness.    All other systems reviewed and are negative.  EKGs/Labs/Other Studies Reviewed:    The following studies were reviewed today:  December 2021 echo personally reviewed Left ventricular function normal, 60% Right ventricular function normal No significant valvular abnormalities  EKG:  The ekg ordered today demonstrates sinus rhythm with a ventricular rate of 54 bpm.  QTc is 410 ms.  Recent Labs: 07/30/2021: ALT 26; BUN 16; Creatinine, Ser 0.92; Hemoglobin 15.2; Platelets 149.0; Potassium 4.6; Sodium 141; TSH 4.06  Recent Lipid Panel    Component Value Date/Time   CHOL 162 07/30/2021 0921   CHOL 144 11/19/2015 1119   TRIG 269.0 (H) 07/30/2021 0921   HDL 44.50 07/30/2021 0921   HDL 42 11/19/2015 1119   CHOLHDL 4 07/30/2021 0921   VLDL 53.8 (H) 07/30/2021  0921   LDLCALC 62 08/03/2020 0848   LDLCALC 71 11/19/2015 1119   LDLDIRECT 87.0 07/30/2021 0921    Physical Exam:    VS:  BP 130/70 (BP Location: Left Arm, Patient Position: Sitting, Cuff Size: Normal)   Pulse (!) 54   Ht 5\' 4"  (1.626 m)   Wt 158 lb (71.7 kg)   SpO2 97%   BMI 27.12 kg/m     Wt Readings from Last 3 Encounters:  08/28/21 158 lb (71.7 kg)  08/07/21 158 lb (71.7 kg)  07/15/21 160 lb (72.6 kg)     GEN:  Well nourished, well developed in no acute distress.  Appears younger than stated age. HEENT: Normal NECK: No JVD; No carotid bruits LYMPHATICS: No lymphadenopathy CARDIAC: RRR, no murmurs, rubs, gallops RESPIRATORY:  Clear to auscultation without rales, wheezing or rhonchi  ABDOMEN: Soft, non-tender, non-distended MUSCULOSKELETAL:  No edema; No deformity  SKIN: Warm and dry NEUROLOGIC:  Alert and oriented x 3 PSYCHIATRIC:  Normal affect   ASSESSMENT:    1. Paroxysmal atrial fibrillation (HCC)    PLAN:    In order of problems listed above:  #Symptomatic paroxysmal atrial fibrillation Highly symptomatic paroxysms of atrial fibrillation.  Episodes are becoming more frequent and longer lasting.  She is on Eliquis for stroke prophylaxis.  She takes metoprolol XL 12.5 mg by mouth once daily.  She also takes dronedarone 400 mg by mouth twice daily.  She is interested in a rhythm control strategy.  We discussed changing antiarrhythmic drugs or using ablation to help achieve rhythm control.  She is interested in pursuing ablation.  I discussed the procedure in detail including the risks, recovery, efficacy and she wishes to proceed.  Risk, benefits, and alternatives to EP study and radiofrequency ablation for afib were also discussed in detail today. These risks include but are not limited to stroke, bleeding, vascular damage, tamponade, perforation, damage to the esophagus, lungs, and other structures, pulmonary  vein stenosis, worsening renal function, and death.  The patient understands these risk and wishes to proceed.  We will therefore proceed with catheter ablation at the next available time.  Carto, ICE, anesthesia are requested for the procedure.  Will also obtain CT PV protocol prior to the procedure to exclude LAA thrombus and further evaluate atrial anatomy.  Ablation strategy will be PVI only.      Total time spent with patient today 65 minutes. This includes reviewing records, evaluating the patient and coordinating care.  Medication Adjustments/Labs and Tests Ordered: Current medicines are reviewed at length with the patient today.  Concerns regarding medicines are outlined above.  Orders Placed This Encounter  Procedures   EKG 12-Lead    No orders of the defined types were placed in this encounter.    Signed, Hilton Cork. Quentin Ore, MD, [facility], [facility] 08/28/2021 1:15 PM    Electrophysiology [facility]

## 2021-08-29 ENCOUNTER — Telehealth: Payer: Self-pay

## 2021-08-29 DIAGNOSIS — I480 Paroxysmal atrial fibrillation: Secondary | ICD-10-CM

## 2021-08-29 DIAGNOSIS — Z01812 Encounter for preprocedural laboratory examination: Secondary | ICD-10-CM

## 2021-08-29 MED ORDER — LEVOTHYROXINE SODIUM 25 MCG PO TABS: 25.0000 ug | ORAL_TABLET | Freq: Every day | ORAL | 11 refills | Status: DC

## 2021-08-29 NOTE — Telephone Encounter (Signed)
Spoke with pt and reviewed instructions for upcoming Afib Ablation and Cardiac CT.  Pt advised instructions have been sent to her MyChart to review.  Pt advised to contact office for any further questions or concerns.  Pt verbalizes understanding and agrees with current plan.

## 2021-09-09 ENCOUNTER — Ambulatory Visit: Payer: Medicare Other

## 2021-09-16 ENCOUNTER — Other Ambulatory Visit: Payer: Self-pay | Admitting: Family Medicine

## 2021-10-16 ENCOUNTER — Other Ambulatory Visit: Payer: Self-pay

## 2021-10-16 ENCOUNTER — Other Ambulatory Visit (INDEPENDENT_AMBULATORY_CARE_PROVIDER_SITE_OTHER): Payer: Medicare Other

## 2021-10-16 DIAGNOSIS — I480 Paroxysmal atrial fibrillation: Secondary | ICD-10-CM

## 2021-10-16 DIAGNOSIS — Z01812 Encounter for preprocedural laboratory examination: Secondary | ICD-10-CM

## 2021-10-17 LAB — BASIC METABOLIC PANEL
BUN/Creatinine Ratio: 17 (ref 12–28)
BUN: 15 mg/dL (ref 8–27)
CO2: 19 mmol/L — ABNORMAL LOW (ref 20–29)
Calcium: 9.2 mg/dL (ref 8.7–10.3)
Chloride: 105 mmol/L (ref 96–106)
Creatinine, Ser: 0.86 mg/dL (ref 0.57–1.00)
Glucose: 79 mg/dL (ref 70–99)
Potassium: 3.9 mmol/L (ref 3.5–5.2)
Sodium: 140 mmol/L (ref 134–144)
eGFR: 68 mL/min/{1.73_m2} (ref >59)

## 2021-10-17 LAB — CBC WITH DIFFERENTIAL/PLATELET
Basophils Absolute: 0 10*3/uL (ref 0.0–0.2)
Basos: 1 %
EOS (ABSOLUTE): 0.1 10*3/uL (ref 0.0–0.4)
Eos: 3 %
Hematocrit: 42.4 % (ref 34.0–46.6)
Hemoglobin: 14.8 g/dL (ref 11.1–15.9)
Immature Grans (Abs): 0 10*3/uL (ref 0.0–0.1)
Immature Granulocytes: 0 %
Lymphocytes Absolute: 1.4 10*3/uL (ref 0.7–3.1)
Lymphs: 33 %
MCH: 32.1 pg (ref 26.6–33.0)
MCHC: 34.9 g/dL (ref 31.5–35.7)
MCV: 92 fL (ref 79–97)
Monocytes Absolute: 0.2 10*3/uL (ref 0.1–0.9)
Monocytes: 5 %
Neutrophils Absolute: 2.5 10*3/uL (ref 1.4–7.0)
Neutrophils: 58 %
Platelets: 173 10*3/uL (ref 150–450)
RBC: 4.61 x10E6/uL (ref 3.77–5.28)
RDW: 13.1 % (ref 11.7–15.4)
WBC: 4.3 10*3/uL (ref 3.4–10.8)

## 2021-10-18 ENCOUNTER — Telehealth (HOSPITAL_COMMUNITY): Payer: Self-pay | Admitting: Emergency Medicine

## 2021-10-18 NOTE — Telephone Encounter (Signed)
Reaching out to patient to offer assistance regarding upcoming cardiac imaging study; pt verbalizes understanding of appt date/time, parking situation and where to check in, pre-test NPO status and medications ordered, and verified current allergies; name and call back number provided for further questions should they arise Alexandria Bond RN [facility] [phone] office [phone] cell  Difficult iv  Daily meds

## 2021-10-21 ENCOUNTER — Ambulatory Visit (HOSPITAL_COMMUNITY)
Admission: RE | Admit: 2021-10-21 | Discharge: 2021-10-21 | Disposition: A | Discharge status: Home / Self Care | Payer: Medicare Other | Source: Ambulatory Visit | Attending: Cardiology | Admitting: Cardiology

## 2021-10-21 ENCOUNTER — Other Ambulatory Visit: Payer: Self-pay

## 2021-10-21 DIAGNOSIS — I480 Paroxysmal atrial fibrillation: Secondary | ICD-10-CM | POA: Insufficient documentation

## 2021-10-21 MED ORDER — IOHEXOL 350 MG/ML SOLN
100.0000 mL | Freq: Once | INTRAVENOUS | Status: AC | PRN
  Administered 2021-10-21: 80 mL via INTRAVENOUS

## 2021-10-25 NOTE — Pre-Procedure Instructions (Signed)
Instructed patient on the following items: Arrival time 0730 Nothing to eat or drink after midnight No meds AM of procedure Responsible person to drive you home and stay with you for 24 hrs  Have you missed any doses of anti-coagulant Eliquis- possible missed a dose yesterday am. Will notify Dr Quentin Ore.

## 2021-10-28 ENCOUNTER — Ambulatory Visit (HOSPITAL_COMMUNITY): Payer: Medicare Other | Admitting: Anesthesiology

## 2021-10-28 ENCOUNTER — Ambulatory Visit (HOSPITAL_COMMUNITY)
Admission: RE | Admit: 2021-10-28 | Discharge: 2021-10-28 | Disposition: A | Discharge status: Home / Self Care | Payer: Medicare Other | Attending: Cardiology | Admitting: Cardiology

## 2021-10-28 ENCOUNTER — Encounter (HOSPITAL_COMMUNITY): Payer: Self-pay | Admitting: Cardiology

## 2021-10-28 ENCOUNTER — Other Ambulatory Visit: Payer: Self-pay

## 2021-10-28 ENCOUNTER — Encounter (HOSPITAL_COMMUNITY)
Admission: RE | Disposition: A | Discharge status: Home / Self Care | Payer: Self-pay | Source: Home / Self Care | Attending: Cardiology

## 2021-10-28 DIAGNOSIS — D696 Thrombocytopenia, unspecified: Secondary | ICD-10-CM | POA: Diagnosis not present

## 2021-10-28 DIAGNOSIS — Z79899 Other long term (current) drug therapy: Secondary | ICD-10-CM | POA: Insufficient documentation

## 2021-10-28 DIAGNOSIS — Z7901 Long term (current) use of anticoagulants: Secondary | ICD-10-CM | POA: Insufficient documentation

## 2021-10-28 DIAGNOSIS — E785 Hyperlipidemia, unspecified: Secondary | ICD-10-CM | POA: Diagnosis not present

## 2021-10-28 DIAGNOSIS — I480 Paroxysmal atrial fibrillation: Secondary | ICD-10-CM | POA: Insufficient documentation

## 2021-10-28 DIAGNOSIS — G43909 Migraine, unspecified, not intractable, without status migrainosus: Secondary | ICD-10-CM | POA: Diagnosis not present

## 2021-10-28 HISTORY — PX: ATRIAL FIBRILLATION ABLATION: EP1191

## 2021-10-28 LAB — POCT ACTIVATED CLOTTING TIME
Activated Clotting Time: 364 seconds
Activated Clotting Time: 410 seconds

## 2021-10-28 SURGERY — ATRIAL FIBRILLATION ABLATION
Anesthesia: General

## 2021-10-28 MED ORDER — ROCURONIUM BROMIDE 10 MG/ML (PF) SYRINGE
PREFILLED_SYRINGE | INTRAVENOUS | Status: DC | PRN
Start: 2021-10-28 — End: 2021-10-28
  Administered 2021-10-28: 60 mg via INTRAVENOUS
  Administered 2021-10-28: 20 mg via INTRAVENOUS

## 2021-10-28 MED ORDER — APIXABAN 5 MG PO TABS
5.0000 mg | ORAL_TABLET | Freq: Two times a day (BID) | ORAL | Status: DC
  Administered 2021-10-28: 5 mg via ORAL
  Filled 2021-10-28 (×2): qty 1

## 2021-10-28 MED ORDER — SODIUM CHLORIDE 0.9 % IV SOLN: INTRAVENOUS | Status: DC

## 2021-10-28 MED ORDER — PHENYLEPHRINE HCL-NACL 20-0.9 MG/250ML-% IV SOLN
INTRAVENOUS | Status: DC | PRN
  Administered 2021-10-28: 25 ug/min via INTRAVENOUS

## 2021-10-28 MED ORDER — ONDANSETRON HCL 4 MG/2ML IJ SOLN
INTRAMUSCULAR | Status: DC | PRN
  Administered 2021-10-28: 4 mg via INTRAVENOUS

## 2021-10-28 MED ORDER — SODIUM CHLORIDE 0.9 % IV SOLN: 250.0000 mL | INTRAVENOUS | Status: DC | PRN

## 2021-10-28 MED ORDER — HEPARIN SODIUM (PORCINE) 1000 UNIT/ML IJ SOLN
INTRAMUSCULAR | Status: DC | PRN
  Administered 2021-10-28: 11000 [IU] via INTRAVENOUS

## 2021-10-28 MED ORDER — PROTAMINE SULFATE 10 MG/ML IV SOLN
INTRAVENOUS | Status: DC | PRN
  Administered 2021-10-28: 10 mg via INTRAVENOUS
  Administered 2021-10-28: 25 mg via INTRAVENOUS

## 2021-10-28 MED ORDER — HEPARIN (PORCINE) IN NACL 1000-0.9 UT/500ML-% IV SOLN
INTRAVENOUS | Status: DC | PRN
  Administered 2021-10-28 (×4): 500 mL

## 2021-10-28 MED ORDER — SODIUM CHLORIDE 0.9% FLUSH: 3.0000 mL | Freq: Two times a day (BID) | INTRAVENOUS | Status: DC

## 2021-10-28 MED ORDER — LIDOCAINE 2% (20 MG/ML) 5 ML SYRINGE
INTRAMUSCULAR | Status: DC | PRN
  Administered 2021-10-28: 40 mg via INTRAVENOUS

## 2021-10-28 MED ORDER — SODIUM CHLORIDE 0.9% FLUSH: 3.0000 mL | INTRAVENOUS | Status: DC | PRN

## 2021-10-28 MED ORDER — ONDANSETRON HCL 4 MG/2ML IJ SOLN: 4.0000 mg | Freq: Four times a day (QID) | INTRAMUSCULAR | Status: DC | PRN

## 2021-10-28 MED ORDER — SUGAMMADEX SODIUM 200 MG/2ML IV SOLN
INTRAVENOUS | Status: DC | PRN
  Administered 2021-10-28: 200 mg via INTRAVENOUS

## 2021-10-28 MED ORDER — HEPARIN (PORCINE) IN NACL 1000-0.9 UT/500ML-% IV SOLN
INTRAVENOUS | Status: AC
  Filled 2021-10-28: qty 1500

## 2021-10-28 MED ORDER — HEPARIN SODIUM (PORCINE) 1000 UNIT/ML IJ SOLN
INTRAMUSCULAR | Status: DC | PRN
  Administered 2021-10-28: 1000 [IU] via INTRAVENOUS

## 2021-10-28 MED ORDER — DEXAMETHASONE SODIUM PHOSPHATE 10 MG/ML IJ SOLN
INTRAMUSCULAR | Status: DC | PRN
  Administered 2021-10-28: 10 mg via INTRAVENOUS

## 2021-10-28 MED ORDER — ISOPROTERENOL HCL 0.2 MG/ML IJ SOLN
INTRAMUSCULAR | Status: AC
  Filled 2021-10-28: qty 5

## 2021-10-28 MED ORDER — HEPARIN SODIUM (PORCINE) 1000 UNIT/ML IJ SOLN
INTRAMUSCULAR | Status: AC
  Filled 2021-10-28: qty 1

## 2021-10-28 MED ORDER — FENTANYL CITRATE (PF) 250 MCG/5ML IJ SOLN
INTRAMUSCULAR | Status: DC | PRN
  Administered 2021-10-28: 50 ug via INTRAVENOUS

## 2021-10-28 MED ORDER — PROPOFOL 10 MG/ML IV BOLUS
INTRAVENOUS | Status: DC | PRN
  Administered 2021-10-28: 100 mg via INTRAVENOUS

## 2021-10-28 MED ORDER — ISOPROTERENOL HCL 0.2 MG/ML IJ SOLN
INTRAVENOUS | Status: DC | PRN
  Administered 2021-10-28: 2 ug/min via INTRAVENOUS

## 2021-10-28 SURGICAL SUPPLY — 18 items
BLANKET WARM UNDERBOD FULL ACC (MISCELLANEOUS) ×2 IMPLANT
CATH 8FR REPROCESSED SOUNDSTAR (CATHETERS) ×2 IMPLANT
CATH OCTARAY 2.0 F 3-3-3-3-3 (CATHETERS) ×2 IMPLANT
CATH S CIRCA THERM PROBE 10F (CATHETERS) ×2 IMPLANT
CATH SMTCH THERMOCOOL SF DF (CATHETERS) ×2 IMPLANT
CATH WEB BI DIR CSDF CRV REPRO (CATHETERS) ×2 IMPLANT
CLOSURE PERCLOSE PROSTYLE (VASCULAR PRODUCTS) ×6 IMPLANT
COVER SWIFTLINK CONNECTOR (BAG) ×2 IMPLANT
PACK EP LATEX FREE (CUSTOM PROCEDURE TRAY) ×2
PACK EP LF (CUSTOM PROCEDURE TRAY) ×1 IMPLANT
PAD PRO RADIOLUCENT 2001M-C (PAD) ×2 IMPLANT
PATCH CARTO3 (PAD) ×2 IMPLANT
SHEATH BAYLIS TRANSSEPTAL 98CM (NEEDLE) ×2 IMPLANT
SHEATH CARTO VIZIGO SM CVD (SHEATH) ×2 IMPLANT
SHEATH PINNACLE 8F 10CM (SHEATH) ×4 IMPLANT
SHEATH PINNACLE 9F 10CM (SHEATH) ×2 IMPLANT
SHEATH PROBE COVER 6X72 (BAG) ×2 IMPLANT
TUBING SMART ABLATE COOLFLOW (TUBING) ×2 IMPLANT

## 2021-10-28 NOTE — H&P (Signed)
Electrophysiology Office Note:     Date:  08/28/2021    ID:  Alexandria Moreno, DOB 11-Apr-1941, MRN [id number]   PCP:  Ria Bush, MD           [facility] Cardiologist:  Kate Sable, MD  [facility] Electrophysiologist:  Vickie Epley, MD    Referring MD: Kate Sable, MD    Chief Complaint: Atrial fibrillation   History of Present Illness:     Alexandria Moreno is a 80 y.o. female who presents for an evaluation of atrial fibrillation at the request of Dr. Garen Lah. Their medical history includes hyperlipidemia and paroxysmal atrial fibrillation.  She was last seen by Dr. Garen Lah on July 15, 2021.  Her episodes of atrial fibrillation have become more frequent and lasting up to 4 hours at a time.  Episodes are associated with weakness and shortness of breath.  She is also fatigued.  She is maintained on Multaq and Eliquis.  Further up titration of her beta-blockers has been limited because of bradycardia.   She is very active.  She helps take care of her husband who has Parkinson's, dementia and seizures.  She is very motivated to get her atrial fibrillation under better control so she can help better take care of her husband.       Past Medical History:  Diagnosis Date   Basal cell carcinoma 05/22/2015    R nasal ala   Basal cell carcinoma 07/21/2017    L upper arm, L neck, L medial shoulder   Basal cell carcinoma 09/21/2018    R shoulder   Basal cell carcinoma 05/01/2020    L lower neck    Basal cell carcinoma 05/01/2020    L neck ant to scar    Basal cell carcinoma 11/05/2020    L nasal ala, refer for Radiation   Diastolic dysfunction      a. 09/2019 Echo: EF 60-65%, nl RV size/fxn. Nl Atrial sizes. Triv TR. Mild AI; b. 11/2020 Echo: EF 60-65%, no rwma, Gr2DD, nl RV size/fxn, RVSP 34.67mmHg. Mild MR/AI.   Diverticulosis     DJD (degenerative joint disease) of knee     GERD (gastroesophageal reflux disease)     H/o Iron deficiency anemia 2009     a. 05/2008 EGD/Colonoscopy: nl w/o evidence of bleeding; b. 11/2017 EGD: nl. Colonoscopy: 1mm cecal polyp, otw nl.   H/O transfusion of whole blood 2009   H/O: rheumatic fever      80 years old   History of chicken pox     History of stress test      a. 10/2019 EF 53%, no ischemia/infarct. No significant cor Ca2+. Mild thoracic desc Ao atherosclerosis.   Hx of basal cell carcinoma 2010    L dorsum tip of nose (MOHS), R ant alar groove (MOHS)   Hx of squamous cell carcinoma 11/01/2018    SCC is L upper forehead   Hyperlipidemia     Metatarsal fracture 09/2020    L 5th shaft s/p conservative management [place] Heck)   Migraines     PAF (paroxysmal atrial fibrillation) ([place])      a. 08/2019 Zio: Avg HR 60 (min 43, max 144). PAF (2% burden), longest 1h 28m (avg rate 77). Rare PAC's and PVC's.   Palpitation     Pelvic prolapse      with rectocele   Rosacea     Seasonal allergic rhinitis      mild   Squamous cell carcinoma of skin 09/21/2018  L upper forehead   Stress incontinence             Past Surgical History:  Procedure Laterality Date   ABDOMINAL HYSTERECTOMY   1981   APPENDECTOMY   1960   capsule endoscopy   2009    WNL   CATARACT EXTRACTION Bilateral [id number]   COLONOSCOPY   2009    diverticulosis ([place])   COLONOSCOPY WITH PROPOFOL N/A 12/01/2017    TA, rpt 5 yrs ([place])   DEXA   06/2016    T -0.2 hip, 0.7 spine   ESOPHAGOGASTRODUODENOSCOPY   2009    focal mild chronic gastritis, neg H pylori ([place])   ESOPHAGOGASTRODUODENOSCOPY (EGD) WITH PROPOFOL N/A 12/01/2017    Procedure: ESOPHAGOGASTRODUODENOSCOPY (EGD) WITH PROPOFOL;  Surgeon: Lucilla Lame, MD;  Location: [facility] ENDOSCOPY;  Service: Endoscopy;  Laterality: N/A;   INCONTINENCE SURGERY   2001    with mesh   LIPOMA EXCISION   1971    right shoulder   MOHS SURGERY        [facility]   OVARIAN CYST REMOVAL   1960   SALPINGOOPHORECTOMY Left 2001   TONSILLECTOMY   1958      Current Medications: Active Medications       Current Meds  Medication Sig   apixaban (ELIQUIS) 5 MG TABS tablet Take 1 tablet (5 mg total) by mouth 2 (two) times daily.   atorvastatin (LIPITOR) 40 MG tablet TAKE 1 TABLET DAILY   B COMPLEX VITAMINS PO Take 1 tablet by mouth daily.   chlorhexidine (PERIDEX) 0.12 % solution as needed.    cycloSPORINE (RESTASIS) 0.05 % ophthalmic emulsion Apply 0.05 drops to eye 2 (two) times daily.   diphenoxylate-atropine (LOMOTIL) 2.5-0.025 MG tablet Take by mouth as needed for diarrhea or loose stools.   dronedarone (MULTAQ) 400 MG tablet Take 1 tablet (400 mg total) by mouth 2 (two) times daily with a meal.   glucosamine-chondroitin 500-400 MG tablet Take 1 tablet by mouth 2 (two) times daily.   Methylcellulose, Laxative, (FIBER THERAPY PO) Take 3 tablets by mouth daily.   metoprolol succinate (TOPROL-XL) 25 MG 24 hr tablet Take 0.5 tablet (12.5 mg) by mouth once daily   mometasone (ELOCON) 0.1 % cream Apply to affected areas lower legs 1-2 times a day until improved.   MULTIPLE VITAMIN PO Take by mouth daily.   omeprazole (PRILOSEC) 40 MG capsule Take 1 capsule (40 mg total) by mouth 2 (two) times daily. (Patient taking differently: Take 40 mg by mouth daily.)   rizatriptan (MAXALT) 10 MG tablet Take 10 mg by mouth daily as needed.   topiramate (TOPAMAX) 50 MG tablet Take 1 tablet (50 mg total) by mouth at bedtime.   VITAMIN D PO Taking one gummy by mouth daily- 5037mcg        Allergies:   Aspirin, Baclofen, Codeine, Imitrex  [sumatriptan], Influenza vaccines, and Vicodin  [hydrocodone-acetaminophen]    Social History         Socioeconomic History   Marital status: Married      Spouse name: Not on file   Number of children: Not on file   Years of education: Not on file   Highest education level: Not on file  Occupational History   Not on file  Tobacco Use   Smoking status: Former      Packs/day: 0.50      Years: 20.00      Pack years: 10.00      Types: Cigarettes  Quit date:  12/15/1978      Years since quitting: 42.7   Smokeless tobacco: Never  Vaping Use   Vaping Use: Never used  Substance and Sexual Activity   Alcohol use: No      Alcohol/week: 0.0 standard drinks   Drug use: No   Sexual activity: Never  Other Topics Concern   Not on file  Social History Narrative    Lives with husband     Occupation: retired, was Art therapist    Edu: some college    Activity: stationary bicycle    Diet: good water, following slim fast diet, good vegetables    Social Determinants of Adult nurse Strain: Not on file  Food Insecurity: Not on file  Transportation Needs: Not on file  Physical Activity: Not on file  Stress: Not on file  Social Connections: Not on file      Family History: The patient's family history includes Alcohol abuse in her father; Alzheimer's disease in her maternal grandfather; Cancer in her mother; Dementia in her mother; Depression in her mother; Heart attack in her brother, brother, and father; Heart disease in her father; Hypertension in her brother; Osteoporosis in her father; Stroke in her brother and paternal grandfather; Stroke (age of onset: 6) in her mother; Stroke (age of onset: 75) in her brother. There is no history of Breast cancer.   ROS:   Please see the history of present illness.    All other systems reviewed and are negative.   EKGs/Labs/Other Studies Reviewed:     The following studies were reviewed today:   December 2021 echo personally reviewed Left ventricular function normal, 60% Right ventricular function normal No significant valvular abnormalities   EKG:  The ekg ordered today demonstrates sinus rhythm with a ventricular rate of 54 bpm.  QTc is 410 ms.   Recent Labs: 07/30/2021: ALT 26; BUN 16; Creatinine, Ser 0.92; Hemoglobin 15.2; Platelets 149.0; Potassium 4.6; Sodium 141; TSH 4.06  Recent Lipid Panel Labs (Brief)          Component Value Date/Time    CHOL 162 07/30/2021 0921     CHOL 144 11/19/2015 1119    TRIG 269.0 (H) 07/30/2021 0921    HDL 44.50 07/30/2021 0921    HDL 42 11/19/2015 1119    CHOLHDL 4 07/30/2021 0921    VLDL 53.8 (H) 07/30/2021 0921    LDLCALC 62 08/03/2020 0848    LDLCALC 71 11/19/2015 1119    LDLDIRECT 87.0 07/30/2021 0921        Physical Exam:     VS:  BP 130/70 (BP Location: Left Arm, Patient Position: Sitting, Cuff Size: Normal)   Pulse (!) 54   Ht 5\' 4"  (1.626 m)   Wt 158 lb (71.7 kg)   SpO2 97%   BMI 27.12 kg/m         Wt Readings from Last 3 Encounters:  08/28/21 158 lb (71.7 kg)  08/07/21 158 lb (71.7 kg)  07/15/21 160 lb (72.6 kg)      GEN:  Well nourished, well developed in no acute distress.  Appears younger than stated age. HEENT: Normal NECK: No JVD; No carotid bruits LYMPHATICS: No lymphadenopathy CARDIAC: RRR, no murmurs, rubs, gallops RESPIRATORY:  Clear to auscultation without rales, wheezing or rhonchi  ABDOMEN: Soft, non-tender, non-distended MUSCULOSKELETAL:  No edema; No deformity  SKIN: Warm and dry NEUROLOGIC:  Alert and oriented x 3 PSYCHIATRIC:  Normal affect    ASSESSMENT:  1. Paroxysmal atrial fibrillation (HCC)     PLAN:     In order of problems listed above:   #Symptomatic paroxysmal atrial fibrillation Highly symptomatic paroxysms of atrial fibrillation.  Episodes are becoming more frequent and longer lasting.  She is on Eliquis for stroke prophylaxis.  She takes metoprolol XL 12.5 mg by mouth once daily.  She also takes dronedarone 400 mg by mouth twice daily.  She is interested in a rhythm control strategy.  We discussed changing antiarrhythmic drugs or using ablation to help achieve rhythm control.  She is interested in pursuing ablation.  I discussed the procedure in detail including the risks, recovery, efficacy and she wishes to proceed.   Risk, benefits, and alternatives to EP study and radiofrequency ablation for afib were also discussed in detail today. These risks include but  are not limited to stroke, bleeding, vascular damage, tamponade, perforation, damage to the esophagus, lungs, and other structures, pulmonary vein stenosis, worsening renal function, and death. The patient understands these risk and wishes to proceed.  We will therefore proceed with catheter ablation at the next available time.  Carto, ICE, anesthesia are requested for the procedure.  Will also obtain CT PV protocol prior to the procedure to exclude LAA thrombus and further evaluate atrial anatomy.   Ablation strategy will be PVI only.           Total time spent with patient today 65 minutes. This includes reviewing records, evaluating the patient and coordinating care.   Medication Adjustments/Labs and Tests Ordered: Current medicines are reviewed at length with the patient today.  Concerns regarding medicines are outlined above.     Orders Placed This Encounter  Procedures   EKG 12-Lead      No orders of the defined types were placed in this encounter.       Signed, Hilton Cork. Quentin Ore, MD, [facility], [facility] 08/28/2021 1:15 PM    Electrophysiology [facility]    ---------------------------------------------  I have seen, examined the patient, and reviewed the above assessment and plan.    Plan for PVI.   Vickie Epley, MD 10/28/2021 10:03 AM

## 2021-10-28 NOTE — Discharge Instructions (Signed)
Cardiac Ablation, Care After  This sheet gives you information about how to care for yourself after your procedure. Your health care provider may also give you more specific instructions. If you have problems or questions, contact your health care provider. What can I expect after the procedure? After the procedure, it is common to have:  Bruising around your puncture site.  Tenderness around your puncture site.  Skipped heartbeats.  Tiredness (fatigue).  Follow these instructions at home: Puncture site care   Follow instructions from your health care provider about how to take care of your puncture site. Make sure you: ? If present, leave stitches (sutures), skin glue, or adhesive strips in place. These skin closures may need to stay in place for up to 2 weeks. If adhesive strip edges start to loosen and curl up, you may trim the loose edges. Do not remove adhesive strips completely unless your health care provider tells you to do that. ? If a square bandage is present, this may be removed in 24 hours.   Check your puncture site every day for signs of infection. Check for: ? Redness, swelling, or pain. ? Fluid or blood. If your puncture site starts to bleed, lie down on your back, apply firm pressure to the area, and contact your health care provider. ? Warmth. ? Pus or a bad smell. Driving  Do not drive for at least 4 days after your procedure or however long your health care provider recommends. (Do not resume driving if you have previously been instructed not to drive for other health reasons.)  Do not drive or use heavy machinery while taking prescription pain medicine. Activity  Avoid activities that take a lot of effort for at least 7 days after your procedure.  Do not lift anything that is heavier than 5 lb (4.5 kg) for one week.   No sexual activity for 1 week.   Return to your normal activities as told by your health care provider. Ask your health care provider what  activities are safe for you. General instructions  Take over-the-counter and prescription medicines only as told by your health care provider.  Do not use any products that contain nicotine or tobacco, such as cigarettes and e-cigarettes. If you need help quitting, ask your health care provider.  You may shower after 24 hours, but Do not take baths, swim, or use a hot tub for 1 week.   Do not drink alcohol for 24 hours after your procedure.  Keep all follow-up visits as told by your health care provider. This is important. Contact a health care provider if:  You have redness, mild swelling, or pain around your puncture site.  You have fluid or blood coming from your puncture site that stops after applying firm pressure to the area.  Your puncture site feels warm to the touch.  You have pus or a bad smell coming from your puncture site.  You have a fever.  You have chest pain or discomfort that spreads to your neck, jaw, or arm.  You are sweating a lot.  You feel nauseous.  You have a fast or irregular heartbeat.  You have shortness of breath.  You are dizzy or light-headed and feel the need to lie down.  You have pain or numbness in the arm or leg closest to your puncture site. Get help right away if:  Your puncture site suddenly swells.  Your puncture site is bleeding and the bleeding does not stop after applying firm   pressure to the area. These symptoms may represent a serious problem that is an emergency. Do not wait to see if the symptoms will go away. Get medical help right away. Call your local emergency services (911 in the [place] Do not drive yourself to the hospital. Summary  After the procedure, it is normal to have bruising and tenderness at the puncture site in your groin, neck, or forearm.  Check your puncture site every day for signs of infection.  Get help right away if your puncture site is bleeding and the bleeding does not stop after applying firm  pressure to the area. This is a medical emergency. This information is not intended to replace advice given to you by your health care provider. Make sure you discuss any questions you have with your health care provider.    

## 2021-10-28 NOTE — Anesthesia Procedure Notes (Signed)
Procedure Name: Intubation Date/Time: 10/28/2021 10:51 AM Performed by: Janene Harvey, CRNA Pre-anesthesia Checklist: Patient identified, Emergency Drugs available, Suction available and Patient being monitored Patient Re-evaluated:Patient Re-evaluated prior to induction Oxygen Delivery Method: Circle system utilized Preoxygenation: Pre-oxygenation with 100% oxygen Induction Type: IV induction Ventilation: Mask ventilation without difficulty Laryngoscope Size: Mac and 4 Grade View: Grade II Tube type: Oral Tube size: 7.0 mm Number of attempts: 1 Airway Equipment and Method: Stylet and Oral airway Placement Confirmation: ETT inserted through vocal cords under direct vision, positive ETCO2 and breath sounds checked- equal and bilateral Secured at: 22 cm Tube secured with: Tape Dental Injury: Teeth and Oropharynx as per pre-operative assessment

## 2021-10-28 NOTE — Anesthesia Preprocedure Evaluation (Signed)
Anesthesia Evaluation  Patient identified by MRN, date of birth, ID band Patient awake    Reviewed: Allergy & Precautions, H&P , NPO status , Patient's Chart, lab work & pertinent test results  Airway Mallampati: II   Neck ROM: full    Dental   Pulmonary former smoker,    breath sounds clear to auscultation       Cardiovascular + dysrhythmias Atrial Fibrillation  Rhythm:irregular Rate:Normal     Neuro/Psych  Headaches,    GI/Hepatic GERD  ,  Endo/Other  Hypothyroidism   Renal/GU      Musculoskeletal  (+) Arthritis ,   Abdominal   Peds  Hematology   Anesthesia Other Findings   Reproductive/Obstetrics                             Anesthesia Physical Anesthesia Plan  ASA: 3  Anesthesia Plan: General   Post-op Pain Management:    Induction: Intravenous  PONV Risk Score and Plan: 3 and Ondansetron, Dexamethasone and Treatment may vary due to age or medical condition  Airway Management Planned: Oral ETT  Additional Equipment:   Intra-op Plan:   Post-operative Plan: Extubation in OR  Informed Consent: I have reviewed the patients History and Physical, chart, labs and discussed the procedure including the risks, benefits and alternatives for the proposed anesthesia with the patient or authorized representative who has indicated his/her understanding and acceptance.     Dental advisory given  Plan Discussed with: CRNA, Anesthesiologist and Surgeon  Anesthesia Plan Comments:         Anesthesia Quick Evaluation

## 2021-10-28 NOTE — Transfer of Care (Signed)
Immediate Anesthesia Transfer of Care Note  Patient: Alexandria Moreno  Procedure(s) Performed: ATRIAL FIBRILLATION ABLATION  Patient Location: Cath Lab  Anesthesia Type:General  Level of Consciousness: awake, alert  and oriented  Airway & Oxygen Therapy: Patient Spontanous Breathing  Post-op Assessment: Report given to RN and Post -op Vital signs reviewed and stable  Post vital signs: Reviewed and stable  Last Vitals:  Vitals Value Taken Time  BP 129/43 10/28/21 1257  Temp    Pulse 73 10/28/21 1257  Resp 9 10/28/21 1257  SpO2 100 % 10/28/21 1257  Vitals shown include unvalidated device data.  Last Pain:  Vitals:   10/28/21 0805  TempSrc:   PainSc: 0-No pain         Complications: No notable events documented.

## 2021-10-29 ENCOUNTER — Encounter (HOSPITAL_COMMUNITY): Payer: Self-pay | Admitting: Cardiology

## 2021-10-29 NOTE — Anesthesia Postprocedure Evaluation (Signed)
Anesthesia Post Note  Patient: Alexandria Moreno  Procedure(s) Performed: ATRIAL FIBRILLATION ABLATION     Patient location during evaluation: Cath Lab Anesthesia Type: General Level of consciousness: awake and alert Pain management: pain level controlled Vital Signs Assessment: post-procedure vital signs reviewed and stable Respiratory status: spontaneous breathing, nonlabored ventilation, respiratory function stable and patient connected to nasal cannula oxygen Cardiovascular status: blood pressure returned to baseline and stable Postop Assessment: no apparent nausea or vomiting Anesthetic complications: no   No notable events documented.  Last Vitals:  Vitals:   10/28/21 1645 10/28/21 1715  BP: (!) 147/59 (!) 135/43  Pulse: 80 85  Resp: 11 18  Temp:    SpO2: 98% 99%    Last Pain:  Vitals:   10/28/21 1335  TempSrc:   PainSc: 0-No pain                 Duston Smolenski S

## 2021-10-30 ENCOUNTER — Telehealth: Payer: Self-pay | Admitting: Family Medicine

## 2021-10-30 DIAGNOSIS — R911 Solitary pulmonary nodule: Secondary | ICD-10-CM

## 2021-10-30 MED ORDER — LEVOTHYROXINE SODIUM 25 MCG PO TABS: 25.0000 ug | ORAL_TABLET | Freq: Every day | ORAL | 3 refills | Status: DC

## 2021-10-30 NOTE — Telephone Encounter (Signed)
Notified pt by phn refill sent.  Pt expresses her thanks.  Also, she wants to make Dr. Darnell Level aware that she recently had a CT done so he can look at the results about the nodule.  Says she was told to rpt CT in 6  mos.  No response needed.

## 2021-10-30 NOTE — Telephone Encounter (Signed)
Pt called in requesting a new proscription for levothyroxine (SYNTHROID) 25 MCG tablet be sent to [place], [place] -  . Also pt would like a call back to discuss test results .[phone]

## 2021-10-31 ENCOUNTER — Other Ambulatory Visit: Payer: Self-pay | Admitting: Family Medicine

## 2021-10-31 DIAGNOSIS — R911 Solitary pulmonary nodule: Secondary | ICD-10-CM | POA: Insufficient documentation

## 2021-10-31 NOTE — Telephone Encounter (Signed)
Noted.  Future reminder sent

## 2021-11-05 ENCOUNTER — Ambulatory Visit (INDEPENDENT_AMBULATORY_CARE_PROVIDER_SITE_OTHER): Payer: Medicare Other | Admitting: Dermatology

## 2021-11-05 ENCOUNTER — Other Ambulatory Visit: Payer: Self-pay

## 2021-11-05 DIAGNOSIS — Z85828 Personal history of other malignant neoplasm of skin: Secondary | ICD-10-CM | POA: Diagnosis not present

## 2021-11-05 DIAGNOSIS — L820 Inflamed seborrheic keratosis: Secondary | ICD-10-CM

## 2021-11-05 DIAGNOSIS — L309 Dermatitis, unspecified: Secondary | ICD-10-CM

## 2021-11-05 DIAGNOSIS — L719 Rosacea, unspecified: Secondary | ICD-10-CM | POA: Diagnosis not present

## 2021-11-05 NOTE — Patient Instructions (Signed)

## 2021-11-05 NOTE — Progress Notes (Signed)
Follow-Up Visit   Subjective  Alexandria Moreno is a 80 y.o. female who presents for the following: Recheck excision site (L neck ant to scar - bx proven BCC S/P excision, recheck today for recurrence. ) and irregular skin lesion (On the R forearm - has been there for a few days since being in the hospital for cardiac ablation, and is very itchy and irritated. ).   The following portions of the chart were reviewed this encounter and updated as appropriate:       Review of Systems:  No other skin or systemic complaints except as noted in HPI or Assessment and Plan.  Objective  Well appearing patient in no apparent distress; mood and affect are within normal limits.  A focused examination was performed including the face, neck, and arms. Relevant physical exam findings are noted in the Assessment and Plan.  R forearm Pink edematous scaly papule  B/L hand Pink scaly patch on the R 5th MCP.  R nasal ala Small pink papules. Mild erythema on the malar cheeks.   L lower cheek Pink light tan patch.    Assessment & Plan  Dermatitis R forearm  Possible contact dermatitis to adhesive during hospital stay -   Start Mometasone 0.1% cream to aa's BID until rash cleared (patient has at home).  Topical steroids (such as triamcinolone, fluocinolone, fluocinonide, mometasone, clobetasol, halobetasol, betamethasone, hydrocortisone) can cause thinning and lightening of the skin if they are used for too long in the same area. Your physician has selected the right strength medicine for your problem and area affected on the body. Please use your medication only as directed by your physician to prevent side effects.    Hand dermatitis B/L hand  Apply Mometasone 0.1% cream to aa's QD-BID PRN flares. Samples given of Eucrisa 2% ointment to use BID. Use gloves while washing dishes.   Hand Dermatitis is a chronic type of eczema that can come and go on the hands and fingers.  While there is no  cure, the rash and symptoms can be managed with topical prescription medications, and for more severe cases, with systemic medications.  Recommend mild soap and routine use of moisturizing cream after handwashing.  Minimize soap/water exposure when possible.     Rosacea R nasal ala  Mild flare today  Rosacea is a chronic progressive skin condition usually affecting the face of adults, causing redness and/or acne bumps. It is treatable but not curable. It sometimes affects the eyes (ocular rosacea) as well. It may respond to topical and/or systemic medication and can flare with stress, sun exposure, alcohol, exercise and some foods.  Daily application of broad spectrum spf 30+ sunscreen to face is recommended to reduce flares.  Continue Finacea gel QD/BID  Inflamed seborrheic keratosis L lower cheek  Use Eucrisa sample QD-BID PRN.  Consider LN2 if erythema not improving   Related Medications mometasone (ELOCON) 0.1 % cream Apply to affected areas lower legs 1-2 times a day until improved.  History of Basal Cell Carcinoma of the Skin - No evidence of recurrence today nose. L neck - Recommend regular full body skin exams - Recommend daily broad spectrum sunscreen SPF 30+ to sun-exposed areas, reapply every 2 hours as needed.  - Call if any new or changing lesions are noted between office visits  Return in about 6 months (around 05/05/2022) for TBSE.  Luther Redo, CMA, am acting as scribe for Brendolyn Patty, MD .  Documentation: I have reviewed the above  documentation for accuracy and completeness, and I agree with the above.  Brendolyn Patty MD

## 2021-11-26 ENCOUNTER — Ambulatory Visit (HOSPITAL_COMMUNITY)
Admission: RE | Admit: 2021-11-26 | Discharge: 2021-11-26 | Disposition: A | Discharge status: Home / Self Care | Payer: Medicare Other | Source: Ambulatory Visit | Attending: Nurse Practitioner | Admitting: Nurse Practitioner

## 2021-11-26 ENCOUNTER — Other Ambulatory Visit: Payer: Self-pay

## 2021-11-26 VITALS — BP 130/64 | HR 63 | Ht 63.5 in | Wt 159.8 lb

## 2021-11-26 DIAGNOSIS — I480 Paroxysmal atrial fibrillation: Secondary | ICD-10-CM

## 2021-11-26 DIAGNOSIS — Z79899 Other long term (current) drug therapy: Secondary | ICD-10-CM | POA: Insufficient documentation

## 2021-11-26 DIAGNOSIS — D6869 Other thrombophilia: Secondary | ICD-10-CM | POA: Diagnosis not present

## 2021-11-26 DIAGNOSIS — I4891 Unspecified atrial fibrillation: Secondary | ICD-10-CM | POA: Diagnosis not present

## 2021-11-26 DIAGNOSIS — Z7901 Long term (current) use of anticoagulants: Secondary | ICD-10-CM | POA: Diagnosis not present

## 2021-11-26 NOTE — Progress Notes (Signed)
Primary Care Physician: Ria Bush, MD Referring Physician:Dr. Zyra Parrillo is a 80 y.o. female with a h/o afib that underwent afib ablation 10/28/21. She has not noted any afib since the procedure. She has some soreness rt groin area that only is noted when she is very active. States she has had 3 surgeries in that area in the past. No swallowing issues. Describes a strange feeling of " 1-2 seconds that will wash over her since ablation. Being compliant with eliquis.   Today, she denies symptoms of palpitations, chest pain, shortness of breath, orthopnea, PND, lower extremity edema, dizziness, presyncope, syncope, or neurologic sequela. The patient is tolerating medications without difficulties and is otherwise without complaint today.   Past Medical History:  Diagnosis Date   Basal cell carcinoma 05/22/2015   R nasal ala   Basal cell carcinoma 07/21/2017   L upper arm, L neck, L medial shoulder   Basal cell carcinoma 09/21/2018   R shoulder   Basal cell carcinoma 05/01/2020   L lower neck    Basal cell carcinoma 05/01/2020   L neck ant to scar    Basal cell carcinoma 11/05/2020   L nasal ala, refer for Radiation   Diastolic dysfunction    a. 09/2019 Echo: EF 60-65%, nl RV size/fxn. Nl Atrial sizes. Triv TR. Mild AI; b. 11/2020 Echo: EF 60-65%, no rwma, Gr2DD, nl RV size/fxn, RVSP 34.20mmHg. Mild MR/AI.   Diverticulosis    DJD (degenerative joint disease) of knee    GERD (gastroesophageal reflux disease)    H/o Iron deficiency anemia 2009   a. 05/2008 EGD/Colonoscopy: nl w/o evidence of bleeding; b. 11/2017 EGD: nl. Colonoscopy: 36mm cecal polyp, otw nl.   H/O transfusion of whole blood 2009   H/O: rheumatic fever    80 years old   History of chicken pox    History of stress test    a. 10/2019 EF 53%, no ischemia/infarct. No significant cor Ca2+. Mild thoracic desc Ao atherosclerosis.   Hx of basal cell carcinoma 2010   L dorsum tip of nose ([facility]), R ant alar  groove (MOHS)   Hx of squamous cell carcinoma 11/01/2018   SCC is L upper forehead   Hyperlipidemia    Metatarsal fracture 09/2020   L 5th shaft s/p conservative management [facility])   Migraines    PAF (paroxysmal atrial fibrillation) ([place])    a. 08/2019 [facility]: Avg HR 60 (min 43, max 144). PAF (2% burden), longest 1h 6m (avg rate 77). Rare PAC's and PVC's.   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   L upper forehead   Stress incontinence    Past Surgical History:  Procedure Laterality Date   [place] N/A 10/28/2021   Procedure: ATRIAL FIBRILLATION ABLATION;  Surgeon: Vickie Epley, MD;  Location: [facility];  Service: Cardiovascular;  Laterality: N/A;   capsule endoscopy  2009   WNL   CATARACT EXTRACTION Bilateral [id number]   COLONOSCOPY  2009   diverticulosis ([place])   COLONOSCOPY WITH PROPOFOL N/A 12/01/2017   TA, rpt 5 yrs ([place])   DEXA  06/2016   T -0.2 hip, 0.7 spine   ESOPHAGOGASTRODUODENOSCOPY  2009   focal mild chronic gastritis, neg H pylori ([place])   ESOPHAGOGASTRODUODENOSCOPY (EGD)  WITH PROPOFOL N/A 12/01/2017   Procedure: ESOPHAGOGASTRODUODENOSCOPY (EGD) WITH PROPOFOL;  Surgeon: Lucilla Lame, MD;  Location: [facility] END[facility]COPY;  Service: Endoscopy;  Laterality: N/A;   INCONTINENCE SURGERY  2001   with mesh   LIPOMA EXCISION  1971   right shoulder   MOHS SURGERY     [facility]   OVARIAN CYST REMOVAL  1960   SALPINGOOPHORECTOMY Left 2001   TONSILLECTOMY  1958    Current Outpatient Medications  Medication Sig Dispense Refill   apixaban (ELIQUIS) 5 MG TABS tablet Take 1 tablet (5 mg total) by mouth 2 (two) times daily. 180 tablet 2   atorvastatin (LIPITOR) 40 MG tablet TAKE 1 TABLET DAILY 90 tablet 3   Calcium Carbonate-Vitamin D (CALTRATE 600+D PO) Take 1 tablet by mouth daily.      chlorhexidine (PERIDEX) 0.12 % solution Use as directed 5 mLs in the mouth or throat daily as needed (dental procedure).  0   cholecalciferol (VITAMIN D3) 25 MCG (1000 UNIT) tablet Take 1,000 Units by mouth daily.     cycloSPORINE (RESTASIS) 0.05 % ophthalmic emulsion Apply 1 drop to eye 2 (two) times daily.     diphenoxylate-atropine (LOMOTIL) 2.5-0.025 MG tablet Take 1 tablet by mouth 4 (four) times daily as needed for diarrhea or loose stools.     dronedarone (MULTAQ) 400 MG tablet Take 1 tablet (400 mg total) by mouth 2 (two) times daily with a meal. 180 tablet 2   GLUCOSAMINE-CHONDROITIN PO Take 1 tablet by mouth 2 (two) times daily.     levothyroxine (SYNTHROID) 25 MCG tablet Take 1 tablet (25 mcg total) by mouth daily. 90 tablet 3   Methylcellulose, Laxative, (FIBER THERAPY PO) Take 2 capsules by mouth daily.     metoprolol succinate (TOPROL-XL) 25 MG 24 hr tablet Take 0.5 tablets (12.5 mg total) by mouth daily. 45 tablet 3   mometasone (ELOCON) 0.1 % cream Apply to affected areas lower legs 1-2 times a day until improved. 45 g 0   MULTIPLE VITAMIN PO Take 1 tablet by mouth daily.     omeprazole (PRILOSEC) 40 MG capsule Take 1 capsule (40 mg total) by mouth 2 (two) times daily. (Patient taking differently: Take 40 mg by mouth daily.) 180 capsule 3   rizatriptan (MAXALT) 10 MG tablet Take 10 mg by mouth as needed for migraine.     topiramate (TOPAMAX) 50 MG tablet Take 50 mg by mouth 2 (two) times daily.     vitamin B-12 (CYANOCOBALAMIN) 1000 MCG tablet Take 1,000 mcg by mouth daily.     No current facility-administered medications for this encounter.    Allergies  Allergen Reactions   Aspirin Other (See Comments)    GI bleed   Baclofen Diarrhea   Codeine Nausea And Vomiting   Imitrex  [Sumatriptan]     Palpatations   Influenza Vaccines Swelling   Vicodin  [Hydrocodone-Acetaminophen] Nausea And Vomiting    Social History   Socioeconomic History   Marital status: Married     Spouse name: Not on file   Number of children: Not on file   Years of education: Not on file   Highest education level: Not on file  Occupational History   Not on file  Tobacco Use   Smoking status: Former    Packs/day: 0.50    Years: 20.00    Pack years: 10.00    Types: Cigarettes    Quit date: 12/15/1978    Years since quitting: 42.9   Smokeless tobacco: Never  Vaping Use   Vaping Use: Never used  Substance and Sexual Activity   Alcohol use: No    Alcohol/week: 0.0 standard drinks   Drug use: No   Sexual activity: Never  Other Topics Concern   Not on file  Social History Narrative   Lives with husband    Occupation: retired, was Art therapist   Edu: some college   Activity: stationary bicycle   Diet: good water, following slim fast diet, good vegetables   Social Determinants of Health   Financial Resource Strain: Not on file  Food Insecurity: Not on file  Transportation Needs: Not on file  Physical Activity: Not on file  Stress: Not on file  Social Connections: Not on file  Intimate Partner Violence: Not on file    Family History  Problem Relation Age of Onset   Stroke Mother 45   Cancer Mother        Cervical   Depression Mother    Dementia Mother        vascular   Heart disease Father        CABG   Alcohol abuse Father    Osteoporosis Father    Heart attack Father    Hypertension Brother    Heart attack Brother    Stroke Brother    Stroke Brother 63   Heart attack Brother    Stroke Paternal Grandfather    Alzheimer's disease Maternal Grandfather    Breast cancer Neg Hx     ROS- All systems are reviewed and negative except as per the HPI above  Physical Exam: Vitals:   11/26/21 1053  BP: 130/64  Pulse: 63  Weight: 72.5 kg  Height: 5' 3.5" (1.613 m)   Wt Readings from Last 3 Encounters:  11/26/21 72.5 kg  10/28/21 71.2 kg  08/28/21 71.7 kg    Labs: Lab Results  Component Value Date   NA 140 10/16/2021   K 3.9 10/16/2021   CL 105  10/16/2021   CO2 19 (L) 10/16/2021   GLUCOSE 79 10/16/2021   BUN 15 10/16/2021   CREATININE 0.86 10/16/2021   CALCIUM 9.2 10/16/2021   MG 2.2 07/21/2019   Lab Results  Component Value Date   INR 1.1 11/11/2019   Lab Results  Component Value Date   CHOL 162 07/30/2021   HDL 44.50 07/30/2021   LDLCALC 62 08/03/2020   TRIG 269.0 (H) 07/30/2021     GEN- The patient is well appearing, alert and oriented x 3 today.   Head- normocephalic, atraumatic Eyes-  Sclera clear, conjunctiva pink Ears- hearing intact Oropharynx- clear Neck- supple, no JVP Lymph- no cervical lymphadenopathy Lungs- Clear to ausculation bilaterally, normal work of breathing Heart- Regular rate and rhythm, no murmurs, rubs or gallops, PMI not laterally displaced GI- soft, NT, ND, + BS Extremities- no clubbing, cyanosis, or edema MS- no significant deformity or atrophy Skin- no rash or lesion Psych- euthymic mood, full affect Neuro- strength and sensation are intact  EKG-NSR at 63 bpm, pr int 190 ms, qrs int 80 ms, qtc 413 ms    Assessment and Plan:  1. Afib S/p ablation  Doing well maintaining  SR  Metoprolol succinate 12.5 mg daily   2. CHA2DS2VASc  score of 3 Continue with eliquis 5 mg bid   F/u with Dr. Quentin Ore 2/15   Butch Penny C. Tajai Ihde, [facility] [address] [place], [place] [address] [phone]

## 2021-12-03 DIAGNOSIS — H40013 Open angle with borderline findings, low risk, bilateral: Secondary | ICD-10-CM | POA: Diagnosis not present

## 2021-12-03 DIAGNOSIS — H524 Presbyopia: Secondary | ICD-10-CM | POA: Diagnosis not present

## 2021-12-03 DIAGNOSIS — D3131 Benign neoplasm of right choroid: Secondary | ICD-10-CM | POA: Diagnosis not present

## 2021-12-03 DIAGNOSIS — H04123 Dry eye syndrome of bilateral lacrimal glands: Secondary | ICD-10-CM | POA: Diagnosis not present

## 2021-12-10 ENCOUNTER — Encounter: Payer: Self-pay | Admitting: Gastroenterology

## 2021-12-10 ENCOUNTER — Ambulatory Visit (INDEPENDENT_AMBULATORY_CARE_PROVIDER_SITE_OTHER): Payer: Medicare Other | Admitting: Gastroenterology

## 2021-12-10 VITALS — BP 122/70 | HR 70 | Ht 64.0 in | Wt 158.6 lb

## 2021-12-10 DIAGNOSIS — K219 Gastro-esophageal reflux disease without esophagitis: Secondary | ICD-10-CM

## 2021-12-10 MED ORDER — PANTOPRAZOLE SODIUM 40 MG PO TBEC: 40.0000 mg | DELAYED_RELEASE_TABLET | Freq: Every day | ORAL | 3 refills | Status: DC

## 2021-12-10 NOTE — Progress Notes (Signed)
Primary Care Physician: Ria Bush, MD  Primary Gastroenterologist:  Dr. Lucilla Lame  Chief Complaint  Patient presents with   Medication Refill    HPI: Alexandria Moreno is a 80 y.o. female here with a history of heartburn.  The patient had a CT scan of the chest that showed a lesion in the lung that is being followed and a lesion in the liver that was reported to be 0.9 mm and likely a cyst.  The patient has no issues at the present time and states that her gas has gotten better since she started taking her husband's pantoprazole when she ran out of her omeprazole.  She feels that it is making her symptoms better  Past Medical History:  Diagnosis Date   Basal cell carcinoma 05/22/2015   R nasal ala   Basal cell carcinoma 07/21/2017   L upper arm, L neck, L medial shoulder   Basal cell carcinoma 09/21/2018   R shoulder   Basal cell carcinoma 05/01/2020   L lower neck    Basal cell carcinoma 05/01/2020   L neck ant to scar    Basal cell carcinoma 11/05/2020   L nasal ala, refer for Radiation   Diastolic dysfunction    a. 09/2019 Echo: EF 60-65%, nl RV size/fxn. Nl Atrial sizes. Triv TR. Mild AI; b. 11/2020 Echo: EF 60-65%, no rwma, Gr2DD, nl RV size/fxn, RVSP 34.51mmHg. Mild MR/AI.   Diverticulosis    DJD (degenerative joint disease) of knee    GERD (gastroesophageal reflux disease)    H/o Iron deficiency anemia 2009   a. 05/2008 EGD/Colonoscopy: nl w/o evidence of bleeding; b. 11/2017 EGD: nl. Colonoscopy: 59mm cecal polyp, otw nl.   H/O transfusion of whole blood 2009   H/O: rheumatic fever    80 years old   History of chicken pox    History of stress test    a. 10/2019 EF 53%, no ischemia/infarct. No significant cor Ca2+. Mild thoracic desc Ao atherosclerosis.   Hx of basal cell carcinoma 2010   L dorsum tip of nose (MOHS), R ant alar groove (MOHS)   Hx of squamous cell carcinoma 11/01/2018   SCC is L upper forehead   Hyperlipidemia    Metatarsal fracture 09/2020    L 5th shaft s/p conservative management [facility])   Migraines    PAF (paroxysmal atrial fibrillation) ([place])    a. 08/2019 Zio: Avg HR 60 (min 43, max 144). PAF (2% burden), longest 1h 43m (avg rate 77). Rare PAC's and PVC's.   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 180 tablet 2   atorvastatin (LIPITOR) 40 MG tablet TAKE 1 TABLET DAILY 90 tablet 3   Calcium Carbonate-Vitamin D (CALTRATE 600+D PO) Take 1 tablet by mouth daily.     chlorhexidine (PERIDEX) 0.12 % solution Use as directed 5 mLs in the mouth or throat daily as needed (dental procedure).  0   cholecalciferol (VITAMIN D3) 25 MCG (1000 UNIT) tablet Take 1,000 Units by mouth daily.     cycloSPORINE (RESTASIS) 0.05 % ophthalmic emulsion Apply 1 drop to eye 2 (two) times  daily.     diphenoxylate-atropine (LOMOTIL) 2.5-0.025 MG tablet Take 1 tablet by mouth 4 (four) times daily as needed for diarrhea or loose stools.     dronedarone (MULTAQ) 400 MG tablet Take 1 tablet (400 mg total) by mouth 2 (two) times daily with a meal. 180 tablet 2   GLUCOSAMINE-CHONDROITIN PO Take 1 tablet by mouth 2 (two) times daily.     levothyroxine (SYNTHROID) 25 MCG tablet Take 1 tablet (25 mcg total) by mouth daily. 90 tablet 3   Methylcellulose, Laxative, (FIBER THERAPY PO) Take 2 capsules by mouth daily.     metoprolol succinate (TOPROL-XL) 25 MG 24 hr tablet Take 0.5 tablets (12.5 mg total) by mouth daily. 45 tablet 3   mometasone (ELOCON) 0.1 % cream Apply to affected areas lower legs 1-2 times a day until improved. 45 g 0   MULTIPLE VITAMIN PO Take 1 tablet by mouth daily.     omeprazole (PRILOSEC) 40 MG capsule Take 1 capsule (40 mg total) by  mouth 2 (two) times daily. (Patient taking differently: Take 40 mg by mouth daily.) 180 capsule 3   pantoprazole (PROTONIX) 40 MG tablet Take 1 tablet (40 mg total) by mouth daily. 90 tablet 3   rizatriptan (MAXALT) 10 MG tablet Take 10 mg by mouth as needed for migraine.     topiramate (TOPAMAX) 50 MG tablet Take 50 mg by mouth 2 (two) times daily.     vitamin B-12 (CYANOCOBALAMIN) 1000 MCG tablet Take 1,000 mcg by mouth daily.     No current facility-administered medications for this visit.    Allergies as of 12/10/2021 - Review Complete 12/10/2021  Allergen Reaction Noted   Aspirin Other (See Comments) 03/19/2015   Baclofen Diarrhea 06/27/2016   Codeine Nausea And Vomiting 03/19/2015   Imitrex  [sumatriptan]  03/19/2015   Influenza vaccines Swelling 01/08/2016   Vicodin  [hydrocodone-acetaminophen] Nausea And Vomiting 03/19/2015    ROS:  General: Negative for anorexia, weight loss, fever, chills, fatigue, weakness. ENT: Negative for hoarseness, difficulty swallowing , nasal congestion. CV: Negative for chest pain, angina, palpitations, dyspnea on exertion, peripheral edema.  Respiratory: Negative for dyspnea at rest, dyspnea on exertion, cough, sputum, wheezing.  GI: See history of present illness. GU:  Negative for dysuria, hematuria, urinary incontinence, urinary frequency, nocturnal urination.  Endo: Negative for unusual weight change.    Physical Examination:   BP 122/70 (BP Location: Left Arm, Patient Position: Sitting, Cuff Size: Normal)    Pulse 70    Ht 5\' 4"  (1.626 m)    Wt 158 lb 9.6 oz (71.9 kg)    BMI 27.22 kg/m   General: Well-nourished, well-developed in no acute distress.  Eyes: No icterus. Conjunctivae pink. Neuro: Alert and oriented x 3.  Grossly intact. Skin: Warm and dry, no jaundice.   Psych: Alert and cooperative, normal mood and affect.  Labs:    Imaging Studies: No results found.  Assessment and Plan:   [name] is a 80 y.o. y/o female  who comes in today for follow-up.  The patient has no worry symptoms and denies any dysphagia abdominal pain nausea vomiting black stools or bloody stools.  She had an incidental lesion found on her liver during a CT scan of the chest.  The patient will have a repeat CT scan of the chest due to a lung lesion found and at that time the liver lesion can be reassessed although I do not believe any further work-up needs to be done for  this cyst since it is statistically most likely a benign cyst.  The patient has been explained the plan and will have her medication refilled.     Lucilla Lame, MD. [name]    Note: This dictation was prepared with Dragon dictation along with smaller phrase technology. Any transcriptional errors that result from this process are unintentional.

## 2021-12-11 ENCOUNTER — Other Ambulatory Visit: Payer: Self-pay | Admitting: Nurse Practitioner

## 2021-12-11 NOTE — Telephone Encounter (Signed)
Pt last saw Roderic Palau, NP on 11/26/21, last labs 10/16/21 Creat 0.86, age 80, weight 71.9, based on specified criteria pt is on appropriate dosage of Eliquis 5mg  BID for afib.  Will refill rx.

## 2021-12-12 ENCOUNTER — Ambulatory Visit
Admission: RE | Admit: 2021-12-12 | Discharge: 2021-12-12 | Disposition: A | Discharge status: Home / Self Care | Payer: Medicare Other | Source: Ambulatory Visit | Attending: Radiation Oncology | Admitting: Radiation Oncology

## 2021-12-12 ENCOUNTER — Other Ambulatory Visit: Payer: Self-pay

## 2021-12-12 ENCOUNTER — Encounter: Payer: Self-pay | Admitting: Radiation Oncology

## 2021-12-12 VITALS — BP 115/59 | HR 63 | Temp 97.6°F | Wt 158.4 lb

## 2021-12-12 DIAGNOSIS — Z923 Personal history of irradiation: Secondary | ICD-10-CM | POA: Insufficient documentation

## 2021-12-12 DIAGNOSIS — R911 Solitary pulmonary nodule: Secondary | ICD-10-CM | POA: Diagnosis not present

## 2021-12-12 DIAGNOSIS — Z08 Encounter for follow-up examination after completed treatment for malignant neoplasm: Secondary | ICD-10-CM | POA: Diagnosis not present

## 2021-12-12 DIAGNOSIS — Z85828 Personal history of other malignant neoplasm of skin: Secondary | ICD-10-CM | POA: Insufficient documentation

## 2021-12-12 DIAGNOSIS — C44311 Basal cell carcinoma of skin of nose: Secondary | ICD-10-CM

## 2021-12-12 NOTE — Progress Notes (Signed)
Radiation Oncology Follow up Note  Name: Alexandria Moreno   Date:   12/12/2021 MRN:  [id number] DOB: 08-18-1941    This 80 y.o. female presents to the clinic today for 1 year follow-up status post electron-beam therapy to her nose and then the left nasal alla for superficial nodular basal cell carcinoma.  REFERRING PROVIDER: Ria Bush, MD  HPI: Patient is an 80 year old female now out 1 year having completed electron-beam therapy to her left nasal alla for superficial nodular basal cell carcinoma.  Seen today in routine follow-up she is doing well.  She has no complaints.  She did have a CT scan.  Of her chest back in November as part of scheduled atrial fibrillation ablation.  Ablation did work.  On that scan there was noted to have a right upper lobe pulmonary nodule with a mean diameter of 7 mm for which a 41-month follow-up CT scan has been ordered.  She is asymptomatic specifically [place] cough hemoptysis or chest tightness.  COMPLICATIONS OF TREATMENT: none  FOLLOW UP COMPLIANCE: keeps appointments   PHYSICAL EXAM:  BP (!) 115/59    Pulse 63    Temp 97.6 F (36.4 C) (Tympanic)    Wt 158 lb 6.4 oz (71.8 kg)    BMI 27.19 kg/m knows shows no evidence of disease.  No evidence in the nasal alla of any remaining nodularity.  Neck is clear without cervical or supraclavicular adenopathy.  Well-developed well-nourished patient in NAD. HEENT reveals PERLA, EOMI, discs not visualized.  Oral cavity is clear. No oral mucosal lesions are identified. Neck is clear without evidence of cervical or supraclavicular adenopathy. Lungs are clear to A&P. Cardiac examination is essentially unremarkable with regular rate and rhythm without murmur rub or thrill. Abdomen is benign with no organomegaly or masses noted. Motor sensory and DTR levels are equal and symmetric in the upper and lower extremities. Cranial nerves II through XII are grossly intact. Proprioception is intact. No peripheral adenopathy or edema  is identified. No motor or sensory levels are noted. Crude visual fields are within normal range. RADIOLOGY RESULTS: CT scan reviewed compatible with above-stated findings showing right upper lobe small well-circumscribed pulmonary nodule not present on prior exams.  PLAN: At this time of asked to see her back in 6 months after her repeat CT scan.  Should this show progression would order a PET CT scan and offer possible SBRT.  Otherwise from a skin standpoint she is doing extremely well.  Patient is to call with any concerns.  I would like to take this opportunity to thank you for allowing me to participate in the care of your patient.Noreene Filbert, MD

## 2021-12-14 DIAGNOSIS — Z20822 Contact with and (suspected) exposure to covid-19: Secondary | ICD-10-CM | POA: Diagnosis not present

## 2021-12-30 ENCOUNTER — Other Ambulatory Visit: Payer: Self-pay | Admitting: Nurse Practitioner

## 2022-01-16 ENCOUNTER — Other Ambulatory Visit
Admission: RE | Admit: 2022-01-16 | Discharge: 2022-01-16 | Disposition: A | Discharge status: Home / Self Care | Payer: Medicare Other | Source: Ambulatory Visit | Attending: Cardiology | Admitting: Cardiology

## 2022-01-16 ENCOUNTER — Ambulatory Visit (INDEPENDENT_AMBULATORY_CARE_PROVIDER_SITE_OTHER): Payer: Medicare Other | Admitting: Cardiology

## 2022-01-16 ENCOUNTER — Other Ambulatory Visit: Payer: Self-pay

## 2022-01-16 ENCOUNTER — Encounter: Payer: Self-pay | Admitting: Cardiology

## 2022-01-16 VITALS — BP 140/72 | HR 72 | Ht 67.0 in | Wt 157.0 lb

## 2022-01-16 DIAGNOSIS — I480 Paroxysmal atrial fibrillation: Secondary | ICD-10-CM | POA: Diagnosis not present

## 2022-01-16 DIAGNOSIS — E7800 Pure hypercholesterolemia, unspecified: Secondary | ICD-10-CM

## 2022-01-16 DIAGNOSIS — R072 Precordial pain: Secondary | ICD-10-CM

## 2022-01-16 DIAGNOSIS — R030 Elevated blood-pressure reading, without diagnosis of hypertension: Secondary | ICD-10-CM

## 2022-01-16 LAB — BASIC METABOLIC PANEL
Anion gap: 5 (ref 5–15)
BUN: 16 mg/dL (ref 8–23)
CO2: 24 mmol/L (ref 22–32)
Calcium: 9 mg/dL (ref 8.9–10.3)
Chloride: 111 mmol/L (ref 98–111)
Creatinine, Ser: 0.86 mg/dL (ref 0.44–1.00)
GFR, Estimated: >60 mL/min (ref >60)
Glucose, Bld: 99 mg/dL (ref 70–99)
Potassium: 3.8 mmol/L (ref 3.5–5.1)
Sodium: 140 mmol/L (ref 135–145)

## 2022-01-16 MED ORDER — IVABRADINE HCL 5 MG PO TABS: 10.0000 mg | ORAL_TABLET | Freq: Once | ORAL | 0 refills | Status: AC

## 2022-01-16 MED ORDER — METOPROLOL TARTRATE 100 MG PO TABS: 100.0000 mg | ORAL_TABLET | Freq: Once | ORAL | 0 refills | Status: DC

## 2022-01-16 NOTE — Progress Notes (Signed)
Cardiology Office Note:    Date:  01/16/2022   ID:  Alexandria Moreno, DOB [birth year], MRN [id number]  PCP:  Ria Bush, MD  Cardiologist:  Kate Sable, MD  Electrophysiologist:  Vickie Epley, MD   Referring MD: Ria Bush, MD   Chief Complaint  Patient presents with   Follow-up   Other    History of Present Illness:    Alexandria Moreno is a 81 y.o. female with a hx of paroxysmal atrial fibrillation s/p RFA 10/2021, hyperlipidemia who presents for follow-up.   Previously seen for symptomatic atrial fibrillation.  Underwent RFA for atrial fibrillation on 10/2021 successfully.  Tolerating Eliquis and low-dose Toprol-XL 12.5 mg daily.  No further episodes of palpitations, Still on Multaq, has follow-up appointment with EP in about 2 weeks.  Symptoms of chest pain, not associated with exertion.  Symptoms last couple of minutes and then go away.  This has been ongoing over the past 3 months or so.  Has a family history of MI in her brother around age 79, dad also had an MI age 7.  She attributes chest pain to stress due to taking care of husband with Parkinson's disease.  Prior notes She did establish care with the [facility] fib clinic where Multaq was started.  echocardiogram 09/2019 showed normal systolic and diastolic function,  Lexiscan Myoview 10/2019 with no evidence for ischemia.     Past Medical History:  Diagnosis Date   Basal cell carcinoma 05/22/2015   R nasal ala   Basal cell carcinoma 07/21/2017   L upper arm, L neck, L medial shoulder   Basal cell carcinoma 09/21/2018   R shoulder   Basal cell carcinoma 05/01/2020   L lower neck    Basal cell carcinoma 05/01/2020   L neck ant to scar    Basal cell carcinoma 11/05/2020   L nasal ala, refer for Radiation   Diastolic dysfunction    a. 09/2019 Echo: EF 60-65%, nl RV size/fxn. Nl Atrial sizes. Triv TR. Mild AI; b. 11/2020 Echo: EF 60-65%, no rwma, Gr2DD, nl RV size/fxn, RVSP 34.16mmHg. Mild MR/AI.    Diverticulosis    DJD (degenerative joint disease) of knee    GERD (gastroesophageal reflux disease)    H/o Iron deficiency anemia 2009   a. 05/2008 EGD/Colonoscopy: nl w/o evidence of bleeding; b. 11/2017 EGD: nl. Colonoscopy: 49mm cecal polyp, otw nl.   H/O transfusion of whole blood 2009   H/O: rheumatic fever    81 years old   History of chicken pox    History of stress test    a. 10/2019 EF 53%, no ischemia/infarct. No significant cor Ca2+. Mild thoracic desc Ao atherosclerosis.   Hx of basal cell carcinoma 2010   L dorsum tip of nose (MOHS), R ant alar groove (MOHS)   Hx of squamous cell carcinoma 11/01/2018   SCC is L upper forehead   Hyperlipidemia    Metatarsal fracture 09/2020   L 5th shaft s/p conservative management [facility])   Migraines    PAF (paroxysmal atrial fibrillation) ([place])    a. 08/2019 Zio: Avg HR 60 (min 43, max 144). PAF (2% burden), longest 1h 76m (avg rate 77). Rare PAC's and PVC's.   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   L upper forehead   Stress incontinence     Past Surgical  History:  Procedure Laterality Date   ABDOMINAL HYSTERECTOMY  1981   APPENDECTOMY  1960   ATRIAL FIBRILLATION ABLATION N/A 10/28/2021   Procedure: ATRIAL FIBRILLATION ABLATION;  Surgeon: Vickie Epley, MD;  Location: [facility];  Service: Cardiovascular;  Laterality: N/A;   capsule endoscopy  2009   WNL   CATARACT EXTRACTION Bilateral [id number]   COLONOSCOPY  2009   diverticulosis ([place])   COLONOSCOPY WITH PROPOFOL N/A 12/01/2017   TA, rpt 5 yrs ([place])   DEXA  06/2016   T -0.2 hip, 0.7 spine   ESOPHAGOGASTRODUODENOSCOPY  2009   focal mild chronic gastritis, neg H pylori ([place])   ESOPHAGOGASTRODUODENOSCOPY (EGD) WITH PROPOFOL N/A 12/01/2017   Procedure: ESOPHAGOGASTRODUODENOSCOPY (EGD) WITH PROPOFOL;  Surgeon: Lucilla Lame, MD;  Location: [facility]  ENDOSCOPY;  Service: Endoscopy;  Laterality: N/A;   INCONTINENCE SURGERY  2001   with mesh   LIPOMA EXCISION  1971   right shoulder   MOHS SURGERY     [facility]   OVARIAN CYST REMOVAL  1960   SALPINGOOPHORECTOMY Left 2001   TONSILLECTOMY  1958    Current Medications: Current Meds  Medication Sig   apixaban (ELIQUIS) 5 MG TABS tablet TAKE 1 TABLET TWICE A DAY   atorvastatin (LIPITOR) 40 MG tablet TAKE 1 TABLET DAILY   Calcium Carbonate-Vitamin D (CALTRATE 600+D PO) Take 1 tablet by mouth daily.   chlorhexidine (PERIDEX) 0.12 % solution Use as directed 5 mLs in the mouth or throat daily as needed (dental procedure).   cholecalciferol (VITAMIN D3) 25 MCG (1000 UNIT) tablet Take 1,000 Units by mouth daily.   cycloSPORINE (RESTASIS) 0.05 % ophthalmic emulsion Apply 1 drop to eye 2 (two) times daily.   diphenoxylate-atropine (LOMOTIL) 2.5-0.025 MG tablet Take 1 tablet by mouth 4 (four) times daily as needed for diarrhea or loose stools.   GLUCOSAMINE-CHONDROITIN PO Take 1 tablet by mouth 2 (two) times daily.   ivabradine (CORLANOR) 5 MG TABS tablet Take 2 tablets (10 mg total) by mouth once for 1 dose. Take 2 hours prior to your CT scan.   levothyroxine (SYNTHROID) 25 MCG tablet Take 1 tablet (25 mcg total) by mouth daily.   Methylcellulose, Laxative, (FIBER THERAPY PO) Take 2 capsules by mouth daily.   metoprolol succinate (TOPROL-XL) 25 MG 24 hr tablet Take 0.5 tablets (12.5 mg total) by mouth daily.   metoprolol tartrate (LOPRESSOR) 100 MG tablet Take 1 tablet (100 mg total) by mouth once for 1 dose. Take 2 hours prior to your CT scan.   mometasone (ELOCON) 0.1 % cream Apply to affected areas lower legs 1-2 times a day until improved.   MULTAQ 400 MG tablet TAKE 1 TABLET TWICE A DAY WITH MEAL(S)   MULTIPLE VITAMIN PO Take 1 tablet by mouth daily.   pantoprazole (PROTONIX) 40 MG tablet Take 1 tablet (40 mg total) by mouth daily.   rizatriptan (MAXALT) 10 MG tablet Take 10 mg by mouth as needed  for migraine.   topiramate (TOPAMAX) 50 MG tablet Take 50 mg by mouth 2 (two) times daily.   vitamin B-12 (CYANOCOBALAMIN) 1000 MCG tablet Take 1,000 mcg by mouth daily.     Allergies:   Aspirin, Baclofen, Codeine, Imitrex  [sumatriptan], Influenza vaccines, and Vicodin  [hydrocodone-acetaminophen]   Social History   Socioeconomic History   Marital status: Married    Spouse name: Not on file   Number of children: Not on file   Years of education: Not on file   Highest education  level: Not on file  Occupational History   Not on file  Tobacco Use   Smoking status: Former    Packs/day: 0.50    Years: 20.00    Pack years: 10.00    Types: Cigarettes    Quit date: 12/15/1978    Years since quitting: 43.1   Smokeless tobacco: Never  Vaping Use   Vaping Use: Never used  Substance and Sexual Activity   Alcohol use: No    Alcohol/week: 0.0 standard drinks   Drug use: No   Sexual activity: Never  Other Topics Concern   Not on file  Social History Narrative   Lives with husband    Occupation: retired, was Art therapist   Edu: some college   Activity: stationary bicycle   Diet: good water, following slim fast diet, good vegetables   Social Determinants of Radio broadcast assistant Strain: Not on file  Food Insecurity: Not on file  Transportation Needs: Not on file  Physical Activity: Not on file  Stress: Not on file  Social Connections: Not on file     Family History: The patient's family history includes Alcohol abuse in her father; Alzheimer's disease in her maternal grandfather; Cancer in her mother; Dementia in her mother; Depression in her mother; Heart attack in her brother, brother, and father; Heart disease in her father; Hypertension in her brother; Osteoporosis in her father; Stroke in her brother and paternal grandfather; Stroke (age of onset: 47) in her mother; Stroke (age of onset: 29) in her brother. There is no history of Breast cancer.  ROS:   Please see  the history of present illness.     All other systems reviewed and are negative.  EKGs/Labs/Other Studies Reviewed:    The following studies were reviewed today:   EKG:  EKG is  ordered today.  The ekg ordered today demonstrates normal sinus rhythm, possible old septal infarct   Recent Labs: 07/30/2021: ALT 26; TSH 4.06 10/16/2021: Hemoglobin 14.8; Platelets 173 01/16/2022: BUN 16; Creatinine, Ser 0.86; Potassium 3.8; Sodium 140  Recent Lipid Panel    Component Value Date/Time   CHOL 162 07/30/2021 0921   CHOL 144 11/19/2015 1119   TRIG 269.0 (H) 07/30/2021 0921   HDL 44.50 07/30/2021 0921   HDL 42 11/19/2015 1119   CHOLHDL 4 07/30/2021 0921   VLDL 53.8 (H) 07/30/2021 0921   LDLCALC 62 08/03/2020 0848   LDLCALC 71 11/19/2015 1119   LDLDIRECT 87.0 07/30/2021 0921    Physical Exam:    VS:  BP 140/72 (BP Location: Left Arm, Patient Position: Sitting, Cuff Size: Normal)    Pulse 72    Ht 5\' 7"  (1.702 m)    Wt 157 lb (71.2 kg)    SpO2 98%    BMI 24.59 kg/m     Wt Readings from Last 3 Encounters:  01/16/22 157 lb (71.2 kg)  12/12/21 158 lb 6.4 oz (71.8 kg)  12/10/21 158 lb 9.6 oz (71.9 kg)     GEN:  Well nourished, well developed in no acute distress HEENT: Normal NECK: No JVD; No carotid bruits LYMPHATICS: No lymphadenopathy CARDIAC: RRR, no murmurs, rubs, gallops RESPIRATORY:  Clear to auscultation without rales, wheezing or rhonchi  ABDOMEN: Soft, non-tender, non-distended MUSCULOSKELETAL:  No edema; left ankle cast boot noted SKIN: Warm and dry NEUROLOGIC:  Alert and oriented x 3 PSYCHIATRIC:  Normal affect   ASSESSMENT:    1. Precordial pain   2. Paroxysmal atrial fibrillation (HCC)   3. Pure  hypercholesterolemia   4. Elevated BP without diagnosis of hypertension     PLAN:    In order of problems listed above:  Chest pain, risk factors hyperlipidemia, former smoker.  Echo with preserved EF.  Get coronary CTA to evaluate presence of CAD. paroxysmal atrial  fibrillation s/p RFA 10/2021, currently in sinus rhythm.  Denies further episodes of palpitations.  Continue Toprol-XL, Multaq, Eliquis.  Keep appointment with EP in 2 weeks regarding medication adjustments if any. hyperlipidemia, continue Lipitor. BP elevated, usually controlled.  Continue to monitor.    Follow up in 1 month  Medication Adjustments/Labs and Tests Ordered: Current medicines are reviewed at length with the patient today.  Concerns regarding medicines are outlined above.  Orders Placed This Encounter  Procedures   CT CORONARY MORPH W/CTA COR W/SCORE W/CA W/CM &/OR WO/CM   Basic metabolic panel   EKG 02-HENI     Meds ordered this encounter  Medications   metoprolol tartrate (LOPRESSOR) 100 MG tablet    Sig: Take 1 tablet (100 mg total) by mouth once for 1 dose. Take 2 hours prior to your CT scan.    Dispense:  1 tablet    Refill:  0   ivabradine (CORLANOR) 5 MG TABS tablet    Sig: Take 2 tablets (10 mg total) by mouth once for 1 dose. Take 2 hours prior to your CT scan.    Dispense:  2 tablet    Refill:  0     Patient Instructions  Medication Instructions:   Your physician recommends that you continue on your current medications as directed. Please refer to the Current Medication list given to you today.   *If you need a refill on your cardiac medications before your next appointment, please call your pharmacy*   Lab Work:  BMP to be drawn at the medical mall today  If you have labs (blood work) drawn today and your tests are completely normal, you will receive your results only by: [place] (if you have MyChart) OR A paper copy in the mail If you have any lab test that is abnormal or we need to change your treatment, we will call you to review the results.   Testing/Procedures:  Your cardiac CT will be scheduled at:  [facility] [address] [place], [place] [phone] [phone]  Please  arrive 15 mins early for check-in and test prep.    Please follow these instructions carefully (unless otherwise directed):    On the Night Before the Test: Be sure to Drink plenty of water. Do not consume any caffeinated/decaffeinated beverages or chocolate 12 hours prior to your test.    On the Day of the Test: Drink plenty of water until 1 hour prior to the test. Do not eat any food 4 hours prior to the test. You may take your regular medications prior to the test.  Take metoprolol (Lopressor) 100 MG two hours prior to test. Take Ivabradine (Corlanor) 10 MG two hours prior to test. FEMALES- please wear underwire-free bra if available, avoid dresses & tight clothing        After the Test: Drink plenty of water. After receiving IV contrast, you may experience a mild flushed feeling. This is normal. On occasion, you may experience a mild rash up to 24 hours after the test. This is not dangerous. If this occurs, you can take Benadryl 25 mg and increase your fluid intake. If you experience trouble breathing, this can be  serious. If it is severe call 911 IMMEDIATELY. If it is mild, please call our office. If you take any of these medications: Glipizide/Metformin, Avandament, Glucavance, please do not take 48 hours after completing test unless otherwise instructed.  Please allow 2-4 weeks for scheduling of routine cardiac CTs. Some insurance companies require a pre-authorization which may delay scheduling of this test.   For non-scheduling related questions, please contact the cardiac imaging nurse navigator should you have any questions/concerns: Marchia Bond, Cardiac Imaging Nurse Navigator Gordy Clement, Cardiac Imaging Nurse Navigator [facility] Direct Office Dial: [phone]   For scheduling needs, including cancellations and rescheduling, please call [place], [phone].     Follow-Up: At [facility], you and your health needs are  our priority.  As part of our continuing mission to provide you with exceptional heart care, we have created designated Provider Care Teams.  These Care Teams include your primary Cardiologist (physician) and Advanced Practice Providers (APPs -  Physician Assistants and Nurse Practitioners) who all work together to provide you with the care you need, when you need it.  We recommend signing up for the patient portal called "MyChart".  Sign up information is provided on this After Visit Summary.  MyChart is used to connect with patients for Virtual Visits (Telemedicine).  Patients are able to view lab/test results, encounter notes, upcoming appointments, etc.  Non-urgent messages can be sent to your provider as well.   To learn more about what you can do with MyChart, go to [email or web address].    Your next appointment:   1 month(s)  The format for your next appointment:   In Person  Provider:   You may see Kate Sable, MD or one of the following Advanced Practice Providers on your designated Care Team:   Murray Hodgkins, NP Christell Faith, PA-C Cadence Kathlen Mody, [place]    Other Instructions       Signed, Kate Sable, MD  01/16/2022 12:09 PM    [place]

## 2022-01-16 NOTE — Patient Instructions (Signed)
Medication Instructions:   Your physician recommends that you continue on your current medications as directed. Please refer to the Current Medication list given to you today.   *If you need a refill on your cardiac medications before your next appointment, please call your pharmacy*   Lab Work:  BMP to be drawn at the medical mall today  If you have labs (blood work) drawn today and your tests are completely normal, you will receive your results only by: [place] (if you have MyChart) OR A paper copy in the mail If you have any lab test that is abnormal or we need to change your treatment, we will call you to review the results.   Testing/Procedures:  Your cardiac CT will be scheduled at:  [facility] [place] [address] [place], [place] [address] [phone]  Please arrive 15 mins early for check-in and test prep.    Please follow these instructions carefully (unless otherwise directed):    On the Night Before the Test: Be sure to Drink plenty of water. Do not consume any caffeinated/decaffeinated beverages or chocolate 12 hours prior to your test.    On the Day of the Test: Drink plenty of water until 1 hour prior to the test. Do not eat any food 4 hours prior to the test. You may take your regular medications prior to the test.  Take metoprolol (Lopressor) 100 MG two hours prior to test. Take Ivabradine (Corlanor) 10 MG two hours prior to test. FEMALES- please wear underwire-free bra if available, avoid dresses & tight clothing        After the Test: Drink plenty of water. After receiving IV contrast, you may experience a mild flushed feeling. This is normal. On occasion, you may experience a mild rash up to 24 hours after the test. This is not dangerous. If this occurs, you can take Benadryl 25 mg and increase your fluid intake. If you experience trouble breathing, this can be serious. If it is severe call  911 IMMEDIATELY. If it is mild, please call our office. If you take any of these medications: Glipizide/Metformin, Avandament, Glucavance, please do not take 48 hours after completing test unless otherwise instructed.  Please allow 2-4 weeks for scheduling of routine cardiac CTs. Some insurance companies require a pre-authorization which may delay scheduling of this test.   For non-scheduling related questions, please contact the cardiac imaging nurse navigator should you have any questions/concerns: Marchia Bond, Cardiac Imaging Nurse Navigator Gordy Clement, Cardiac Imaging Nurse Navigator [facility] Direct Office Dial: [phone]   For scheduling needs, including cancellations and rescheduling, please call [place], [phone].     Follow-Up: At [facility], you and your health needs are our priority.  As part of our continuing mission to provide you with exceptional heart care, we have created designated Provider Care Teams.  These Care Teams include your primary Cardiologist (physician) and Advanced Practice Providers (APPs -  Physician Assistants and Nurse Practitioners) who all work together to provide you with the care you need, when you need it.  We recommend signing up for the patient portal called "MyChart".  Sign up information is provided on this After Visit Summary.  MyChart is used to connect with patients for Virtual Visits (Telemedicine).  Patients are able to view lab/test results, encounter notes, upcoming appointments, etc.  Non-urgent messages can be sent to your provider as well.   To learn more about what you can do with  MyChart, go to [email or web address].    Your next appointment:   1 month(s)  The format for your next appointment:   In Person  Provider:   You may see Kate Sable, MD or one of the following Advanced Practice Providers on your designated Care Team:   Murray Hodgkins, NP Christell Faith, PA-C Cadence  Kathlen Mody, [place]    Other Instructions

## 2022-01-21 DIAGNOSIS — H40003 Preglaucoma, unspecified, bilateral: Secondary | ICD-10-CM | POA: Diagnosis not present

## 2022-01-23 ENCOUNTER — Other Ambulatory Visit: Payer: Self-pay

## 2022-01-23 ENCOUNTER — Telehealth (HOSPITAL_COMMUNITY): Payer: Self-pay | Admitting: Emergency Medicine

## 2022-01-23 MED ORDER — METOPROLOL TARTRATE 100 MG PO TABS: 100.0000 mg | ORAL_TABLET | Freq: Once | ORAL | 0 refills | Status: DC

## 2022-01-24 NOTE — Telephone Encounter (Signed)
Reaching out to patient to offer assistance regarding upcoming cardiac imaging study; pt verbalizes understanding of appt date/time, parking situation and where to check in, pre-test NPO status and medications ordered, and verified current allergies; name and call back number provided for further questions should they arise Marchia Bond RN Navigator Cardiac Imaging [facility] [phone] office [phone] cell  Difficult IV 100mg  metoprolol + 10mg  ivabradine Arrival [phone]

## 2022-01-27 ENCOUNTER — Other Ambulatory Visit: Payer: Self-pay

## 2022-01-27 ENCOUNTER — Ambulatory Visit
Admission: RE | Admit: 2022-01-27 | Discharge: 2022-01-27 | Disposition: A | Discharge status: Home / Self Care | Payer: Medicare Other | Source: Ambulatory Visit | Attending: Cardiology | Admitting: Cardiology

## 2022-01-27 DIAGNOSIS — R072 Precordial pain: Secondary | ICD-10-CM | POA: Insufficient documentation

## 2022-01-27 MED ORDER — NITROGLYCERIN 0.4 MG SL SUBL
0.8000 mg | SUBLINGUAL_TABLET | Freq: Once | SUBLINGUAL | Status: AC
  Administered 2022-01-27: 0.8 mg via SUBLINGUAL

## 2022-01-27 MED ORDER — IOHEXOL 350 MG/ML SOLN
75.0000 mL | Freq: Once | INTRAVENOUS | Status: AC | PRN
  Administered 2022-01-27: 75 mL via INTRAVENOUS

## 2022-01-27 NOTE — Progress Notes (Signed)
Patient tolerated procedure well. Ambulate w/o difficulty. Denies light headedness or being dizzy. Sitting in chair drinking water provided. Encouraged to drink extra water today and reasoning explained. Verbalized understanding. All questions answered. ABC intact. No further needs. Discharge from procedure area w/o issues.   °

## 2022-01-28 DIAGNOSIS — H40003 Preglaucoma, unspecified, bilateral: Secondary | ICD-10-CM | POA: Diagnosis not present

## 2022-01-29 ENCOUNTER — Other Ambulatory Visit: Payer: Self-pay | Admitting: Family Medicine

## 2022-01-29 ENCOUNTER — Other Ambulatory Visit: Payer: Self-pay

## 2022-01-29 ENCOUNTER — Ambulatory Visit (INDEPENDENT_AMBULATORY_CARE_PROVIDER_SITE_OTHER): Payer: Medicare Other | Admitting: Cardiology

## 2022-01-29 ENCOUNTER — Encounter: Payer: Self-pay | Admitting: Cardiology

## 2022-01-29 VITALS — BP 124/62 | HR 63 | Ht 67.0 in | Wt 158.0 lb

## 2022-01-29 DIAGNOSIS — I480 Paroxysmal atrial fibrillation: Secondary | ICD-10-CM

## 2022-01-29 NOTE — Patient Instructions (Addendum)
Medications: Stop Multaq  Your physician recommends that you continue on your current medications as directed. Please refer to the Current Medication list given to you today. *If you need a refill on your cardiac medications before your next appointment, please call your pharmacy*  Lab Work: None. If you have labs (blood work) drawn today and your tests are completely normal, you will receive your results only by: [place] (if you have MyChart) OR A paper copy in the mail If you have any lab test that is abnormal or we need to change your treatment, we will call you to review the results.  Testing/Procedures: None.  Follow-Up: At [facility], you and your health needs are our priority.  As part of our continuing mission to provide you with exceptional heart care, we have created designated Provider Care Teams.  These Care Teams include your primary Cardiologist (physician) and Advanced Practice Providers (APPs -  Physician Assistants and Nurse Practitioners) who all work together to provide you with the care you need, when you need it.  Your physician wants you to follow-up in: 12 months with Lars Mage or one of the following Advanced Practice Providers on your designated Care Team:    Ignacia Bayley, NP Christell Faith PA [place] Kathlen Mody PA    You will receive a reminder letter in the mail two months in advance. If you don't receive a letter, please call our office to schedule the follow-up appointment.  We recommend signing up for the patient portal called "MyChart".  Sign up information is provided on this After Visit Summary.  MyChart is used to connect with patients for Virtual Visits (Telemedicine).  Patients are able to view lab/test results, encounter notes, upcoming appointments, etc.  Non-urgent messages can be sent to your provider as well.   To learn more about what you can do with MyChart, go to [email or web address].    Any Other Special Instructions Will Be Listed  Below (If Applicable).

## 2022-01-29 NOTE — Progress Notes (Signed)
Electrophysiology Office Follow up Visit Note:    Date:  01/29/2022   ID:  Alexandria Moreno, DOB [birth year], MRN [id number]  PCP:  Ria Bush, MD  [facility] Cardiologist:  Kate Sable, MD  [facility] Electrophysiologist:  Vickie Epley, MD    Interval History:    Alexandria Moreno is a 81 y.o. female who presents for a follow up visit after her atrial fibrillation ablation on October 28, 2021.  Since the ablation she has been doing well and maintaining normal rhythm.  She continues to take Eliquis for stroke prophylaxis and Multaq.       Past Medical History:  Diagnosis Date   Basal cell carcinoma 05/22/2015   R nasal ala   Basal cell carcinoma 07/21/2017   L upper arm, L neck, L medial shoulder   Basal cell carcinoma 09/21/2018   R shoulder   Basal cell carcinoma 05/01/2020   L lower neck    Basal cell carcinoma 05/01/2020   L neck ant to scar    Basal cell carcinoma 11/05/2020   L nasal ala, refer for Radiation   Diastolic dysfunction    a. 09/2019 Echo: EF 60-65%, nl RV size/fxn. Nl Atrial sizes. Triv TR. Mild AI; b. 11/2020 Echo: EF 60-65%, no rwma, Gr2DD, nl RV size/fxn, RVSP 34.51mmHg. Mild MR/AI.   Diverticulosis    DJD (degenerative joint disease) of knee    GERD (gastroesophageal reflux disease)    H/o Iron deficiency anemia 2009   a. 05/2008 EGD/Colonoscopy: nl w/o evidence of bleeding; b. 11/2017 EGD: nl. Colonoscopy: 62mm cecal polyp, otw nl.   H/O transfusion of whole blood 2009   H/O: rheumatic fever    81 years old   History of chicken pox    History of stress test    a. 10/2019 EF 53%, no ischemia/infarct. No significant cor Ca2+. Mild thoracic desc Ao atherosclerosis.   Hx of basal cell carcinoma 2010   L dorsum tip of nose (MOHS), R ant alar groove (MOHS)   Hx of squamous cell carcinoma 11/01/2018   SCC is L upper forehead   Hyperlipidemia    Metatarsal fracture 09/2020   L 5th shaft s/p conservative management [place])    Migraines    PAF (paroxysmal atrial fibrillation) ([place])    a. 08/2019 Zio: Avg HR 60 (min 43, max 144). PAF (2% burden), longest 1h 35m (avg rate 77). Rare PAC's and PVC's.   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   L upper forehead   Stress incontinence     Past Surgical History:  Procedure Laterality Date   [place] N/A 10/28/2021   Procedure: ATRIAL FIBRILLATION ABLATION;  Surgeon: Vickie Epley, MD;  Location: [facility];  Service: Cardiovascular;  Laterality: N/A;   capsule endoscopy  2009   WNL   CATARACT EXTRACTION Bilateral [id number]   COLONOSCOPY  2009   diverticulosis ([place])   COLONOSCOPY WITH PROPOFOL N/A 12/01/2017   TA, rpt 5 yrs ([place])   DEXA  06/2016   T -0.2 hip, 0.7 spine   ESOPHAGOGASTRODUODENOSCOPY  2009   focal mild chronic gastritis, neg H pylori ([place])   ESOPHAGOGASTRODUODENOSCOPY (EGD) WITH PROPOFOL N/A 12/01/2017   Procedure: ESOPHAGOGASTRODUODENOSCOPY (EGD) WITH PROPOFOL;  Surgeon: Lucilla Lame, MD;  Location:  [facility];  Service: Endoscopy;  Laterality: N/A;   INCONTINENCE SURGERY  2001   with mesh   LIPOMA EXCISION  1971   right shoulder   MOHS SURGERY     [facility]   OVARIAN CYST REMOVAL  1960   SALPINGOOPHORECTOMY Left 2001   TONSILLECTOMY  1958    Current Medications: Current Meds  Medication Sig   apixaban (ELIQUIS) 5 MG TABS tablet TAKE 1 TABLET TWICE A DAY   Calcium Carbonate-Vitamin D (CALTRATE 600+D PO) Take 1 tablet by mouth daily.   chlorhexidine (PERIDEX) 0.12 % solution Use as directed 5 mLs in the mouth or throat daily as needed (dental procedure).   cholecalciferol (VITAMIN D3) 25 MCG (1000 UNIT) tablet Take 1,000 Units by mouth daily.   cycloSPORINE (RESTASIS) 0.05 % ophthalmic emulsion Apply 1 drop to eye 2 (two) times daily.    diphenoxylate-atropine (LOMOTIL) 2.5-0.025 MG tablet Take 1 tablet by mouth 4 (four) times daily as needed for diarrhea or loose stools.   GLUCOSAMINE-CHONDROITIN PO Take 1 tablet by mouth 2 (two) times daily.   levothyroxine (SYNTHROID) 25 MCG tablet Take 1 tablet (25 mcg total) by mouth daily.   Methylcellulose, Laxative, (FIBER THERAPY PO) Take 2 capsules by mouth daily.   metoprolol succinate (TOPROL-XL) 25 MG 24 hr tablet Take 0.5 tablets (12.5 mg total) by mouth daily.   mometasone (ELOCON) 0.1 % cream Apply to affected areas lower legs 1-2 times a day until improved.   MULTIPLE VITAMIN PO Take 1 tablet by mouth daily.   pantoprazole (PROTONIX) 40 MG tablet Take 1 tablet (40 mg total) by mouth daily.   rizatriptan (MAXALT) 10 MG tablet Take 10 mg by mouth as needed for migraine.   topiramate (TOPAMAX) 50 MG tablet Take 50 mg by mouth 2 (two) times daily.   vitamin B-12 (CYANOCOBALAMIN) 1000 MCG tablet Take 1,000 mcg by mouth daily.   [DISCONTINUED] atorvastatin (LIPITOR) 40 MG tablet TAKE 1 TABLET DAILY   [DISCONTINUED] MULTAQ 400 MG tablet TAKE 1 TABLET TWICE A DAY WITH MEAL(S)     Allergies:   Aspirin, Baclofen, Codeine, Imitrex  [sumatriptan], Influenza vaccines, and Vicodin  [hydrocodone-acetaminophen]   Social History   Socioeconomic History   Marital status: Married    Spouse name: Not on file   Number of children: Not on file   Years of education: Not on file   Highest education level: Not on file  Occupational History   Not on file  Tobacco Use   Smoking status: Former    Packs/day: 0.50    Years: 20.00    Pack years: 10.00    Types: Cigarettes    Quit date: 12/15/1978    Years since quitting: 43.1   Smokeless tobacco: Never  Vaping Use   Vaping Use: Never used  Substance and Sexual Activity   Alcohol use: No    Alcohol/week: 0.0 standard drinks   Drug use: No   Sexual activity: Never  Other Topics Concern   Not on file  Social History Narrative   Lives with  husband    Occupation: retired, was Art therapist   Edu: some college   Activity: stationary bicycle   Diet: good water, following slim fast diet, good vegetables   Social Determinants of Radio broadcast assistant Strain: Not on file  Food Insecurity: Not on file  Transportation Needs: Not on file  Physical Activity: Not on file  Stress: Not on file  Social Connections: Not on file  Family History: The patient's family history includes Alcohol abuse in her father; Alzheimer's disease in her maternal grandfather; Cancer in her mother; Dementia in her mother; Depression in her mother; Heart attack in her brother, brother, and father; Heart disease in her father; Hypertension in her brother; Osteoporosis in her father; Stroke in her brother and paternal grandfather; Stroke (age of onset: 42) in her mother; Stroke (age of onset: 31) in her brother. There is no history of Breast cancer.  ROS:   Please see the history of present illness.    All other systems reviewed and are negative.  EKGs/Labs/Other Studies Reviewed:    The following studies were reviewed today:   EKG:  The ekg ordered today demonstrates sinus rhythm  Recent Labs: 07/30/2021: ALT 26; TSH 4.06 10/16/2021: Hemoglobin 14.8; Platelets 173 01/16/2022: BUN 16; Creatinine, Ser 0.86; Potassium 3.8; Sodium 140  Recent Lipid Panel    Component Value Date/Time   CHOL 162 07/30/2021 0921   CHOL 144 11/19/2015 1119   TRIG 269.0 (H) 07/30/2021 0921   HDL 44.50 07/30/2021 0921   HDL 42 11/19/2015 1119   CHOLHDL 4 07/30/2021 0921   VLDL 53.8 (H) 07/30/2021 0921   LDLCALC 62 08/03/2020 0848   LDLCALC 71 11/19/2015 1119   LDLDIRECT 87.0 07/30/2021 0921    Physical Exam:    VS:  BP 124/62 (BP Location: Left Arm, Patient Position: Sitting, Cuff Size: Normal)    Pulse 63    Ht 5\' 7"  (1.702 m)    Wt 158 lb (71.7 kg)    SpO2 96%    BMI 24.75 kg/m     Wt Readings from Last 3 Encounters:  01/29/22 158 lb (71.7 kg)   01/16/22 157 lb (71.2 kg)  12/12/21 158 lb 6.4 oz (71.8 kg)     GEN:  Well nourished, well developed in no acute distress HEENT: Normal NECK: No JVD; No carotid bruits LYMPHATICS: No lymphadenopathy CARDIAC: RRR, no murmurs, rubs, gallops RESPIRATORY:  Clear to auscultation without rales, wheezing or rhonchi  ABDOMEN: Soft, non-tender, non-distended MUSCULOSKELETAL:  No edema; No deformity  SKIN: Warm and dry NEUROLOGIC:  Alert and oriented x 3 PSYCHIATRIC:  Normal affect        ASSESSMENT:    1. Paroxysmal atrial fibrillation (HCC)    PLAN:    In order of problems listed above:  #Paroxysmal atrial fibrillation Maintaining sinus rhythm after her catheter ablation.  She is on Eliquis for stroke prophylaxis.  I would like to continue the Eliquis uninterrupted for at least the 6 to 12 months after her procedure.  I think she can stop her Multaq today.  We will monitor for recurrence of her arrhythmia.  I will plan to see her back in 1 year or sooner as needed.  If she is maintained normal rhythm during that year, we can discuss stopping her blood thinner as long as there is an ongoing atrial fibrillation surveillance strategy in place.    Follow-up 12 months with APP.     Medication Adjustments/Labs and Tests Ordered: Current medicines are reviewed at length with the patient today.  Concerns regarding medicines are outlined above.  No orders of the defined types were placed in this encounter.  No orders of the defined types were placed in this encounter.    Signed, Lars Mage, MD, [facility], [facility] 01/29/2022 9:53 PM    Electrophysiology [facility]

## 2022-02-14 DIAGNOSIS — I480 Paroxysmal atrial fibrillation: Secondary | ICD-10-CM | POA: Diagnosis not present

## 2022-02-14 DIAGNOSIS — Z636 Dependent relative needing care at home: Secondary | ICD-10-CM | POA: Diagnosis not present

## 2022-02-14 DIAGNOSIS — R911 Solitary pulmonary nodule: Secondary | ICD-10-CM | POA: Diagnosis not present

## 2022-02-14 DIAGNOSIS — G43119 Migraine with aura, intractable, without status migrainosus: Secondary | ICD-10-CM | POA: Diagnosis not present

## 2022-02-17 ENCOUNTER — Other Ambulatory Visit: Payer: Self-pay

## 2022-02-17 ENCOUNTER — Ambulatory Visit (INDEPENDENT_AMBULATORY_CARE_PROVIDER_SITE_OTHER): Payer: Medicare Other | Admitting: Cardiology

## 2022-02-17 ENCOUNTER — Encounter: Payer: Self-pay | Admitting: Cardiology

## 2022-02-17 VITALS — BP 126/78 | HR 69 | Ht 64.0 in | Wt 158.0 lb

## 2022-02-17 DIAGNOSIS — E7800 Pure hypercholesterolemia, unspecified: Secondary | ICD-10-CM

## 2022-02-17 DIAGNOSIS — R072 Precordial pain: Secondary | ICD-10-CM | POA: Diagnosis not present

## 2022-02-17 DIAGNOSIS — I480 Paroxysmal atrial fibrillation: Secondary | ICD-10-CM

## 2022-02-17 NOTE — Progress Notes (Signed)
Cardiology Office Note:    Date:  02/17/2022   ID:  Alexandria Moreno, DOB 06/30/1941, MRN [id number]  PCP:  Ria Bush, MD  Cardiologist:  Kate Sable, MD  Electrophysiologist:  Vickie Epley, MD   Referring MD: Ria Bush, MD   Chief Complaint  Patient presents with   Follow-up    F/U after CT; Patient would like to discuss what dose of metoprolol she should be taking since stopping Multaq.    History of Present Illness:    Alexandria Moreno is a 81 y.o. female with a hx of paroxysmal atrial fibrillation s/p RFA 10/2021, hyperlipidemia who presents for follow-up.   Previously seen for symptoms of chest pain, due to risk factors, coronary CTA was ordered to evaluate presence of CAD.  States feeling well since ablation.  Multaq was stopped after last visit with electrophysiology.  Tolerating Eliquis without any bleeding issues.  Also takes Toprol-XL 12.5 mg daily.  States her chest pains have improved, attributes symptoms to stress.  Husband has Parkinson's.   Prior notes Coronary CTA 01/2022, calcium score 0, no evidence for CAD Echo 11/2020, EF 60 to 65% She did establish care with the [facility] fib clinic where Multaq was started.  echocardiogram 09/2019 showed normal systolic and diastolic function,  Lexiscan Myoview 10/2019 with no evidence for ischemia.     Past Medical History:  Diagnosis Date   Basal cell carcinoma 05/22/2015   R nasal ala   Basal cell carcinoma 07/21/2017   L upper arm, L neck, L medial shoulder   Basal cell carcinoma 09/21/2018   R shoulder   Basal cell carcinoma 05/01/2020   L lower neck    Basal cell carcinoma 05/01/2020   L neck ant to scar    Basal cell carcinoma 11/05/2020   L nasal ala, refer for Radiation   Diastolic dysfunction    a. 09/2019 Echo: EF 60-65%, nl RV size/fxn. Nl Atrial sizes. Triv TR. Mild AI; b. 11/2020 Echo: EF 60-65%, no rwma, Gr2DD, nl RV size/fxn, RVSP 34.57mmHg. Mild MR/AI.   Diverticulosis    DJD  (degenerative joint disease) of knee    GERD (gastroesophageal reflux disease)    H/o Iron deficiency anemia 2009   a. 05/2008 EGD/Colonoscopy: nl w/o evidence of bleeding; b. 11/2017 EGD: nl. Colonoscopy: 31mm cecal polyp, otw nl.   H/O transfusion of whole blood 2009   H/O: rheumatic fever    81 years old   History of chicken pox    History of stress test    a. 10/2019 EF 53%, no ischemia/infarct. No significant cor Ca2+. Mild thoracic desc Ao atherosclerosis.   Hx of basal cell carcinoma 2010   L dorsum tip of nose (MOHS), R ant alar groove (MOHS)   Hx of squamous cell carcinoma 11/01/2018   SCC is L upper forehead   Hyperlipidemia    Metatarsal fracture 09/2020   L 5th shaft s/p conservative management [place])   Migraines    PAF (paroxysmal atrial fibrillation) ([place])    a. 08/2019 Zio: Avg HR 60 (min 43, max 144). PAF (2% burden), longest 1h 20m (avg rate 77). Rare PAC's and PVC's.   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   L upper forehead   Stress incontinence     Past Surgical History:  Procedure Laterality Date   ABDOMINAL HYSTERECTOMY  [place] N/A 10/28/2021   Procedure: ATRIAL FIBRILLATION ABLATION;  Surgeon: Vickie Epley, MD;  Location: [facility];  Service: Cardiovascular;  Laterality: N/A;   capsule endoscopy  2009   WNL   CATARACT EXTRACTION Bilateral 9470,[id number]   COLONOSCOPY  2009   diverticulosis ([place])   COLONOSCOPY WITH PROPOFOL N/A 12/01/2017   TA, rpt 5 yrs ([place])   DEXA  06/2016   T -0.2 hip, 0.7 spine   ESOPHAGOGASTRODUODENOSCOPY  2009   focal mild chronic gastritis, neg H pylori ([place])   ESOPHAGOGASTRODUODENOSCOPY (EGD) WITH PROPOFOL N/A 12/01/2017   Procedure: ESOPHAGOGASTRODUODENOSCOPY (EGD) WITH PROPOFOL;  Surgeon: Lucilla Lame, MD;  Location: [facility];  Service:  Endoscopy;  Laterality: N/A;   INCONTINENCE SURGERY  2001   with mesh   LIPOMA EXCISION  1971   right shoulder   MOHS SURGERY     [facility]   OVARIAN CYST REMOVAL  1960   SALPINGOOPHORECTOMY Left 2001   TONSILLECTOMY  1958    Current Medications: Current Meds  Medication Sig   apixaban (ELIQUIS) 5 MG TABS tablet TAKE 1 TABLET TWICE A DAY   atorvastatin (LIPITOR) 40 MG tablet TAKE 1 TABLET DAILY   Calcium Carbonate-Vitamin D (CALTRATE 600+D PO) Take 1 tablet by mouth daily.   chlorhexidine (PERIDEX) 0.12 % solution Use as directed 5 mLs in the mouth or throat daily as needed (dental procedure).   cholecalciferol (VITAMIN D3) 25 MCG (1000 UNIT) tablet Take 1,000 Units by mouth daily.   cycloSPORINE (RESTASIS) 0.05 % ophthalmic emulsion Apply 1 drop to eye 2 (two) times daily.   diphenoxylate-atropine (LOMOTIL) 2.5-0.025 MG tablet Take 1 tablet by mouth 4 (four) times daily as needed for diarrhea or loose stools.   GLUCOSAMINE-CHONDROITIN PO Take 1 tablet by mouth 2 (two) times daily.   levothyroxine (SYNTHROID) 25 MCG tablet Take 1 tablet (25 mcg total) by mouth daily.   Methylcellulose, Laxative, (FIBER THERAPY PO) Take 2 capsules by mouth daily.   metoprolol succinate (TOPROL-XL) 25 MG 24 hr tablet Take 0.5 tablets (12.5 mg total) by mouth daily.   mometasone (ELOCON) 0.1 % cream Apply to affected areas lower legs 1-2 times a day until improved.   MULTIPLE VITAMIN PO Take 1 tablet by mouth daily.   pantoprazole (PROTONIX) 40 MG tablet Take 1 tablet (40 mg total) by mouth daily.   rizatriptan (MAXALT) 10 MG tablet Take 10 mg by mouth as needed for migraine.   topiramate (TOPAMAX) 50 MG tablet Take 50 mg by mouth 2 (two) times daily.   vitamin B-12 (CYANOCOBALAMIN) 1000 MCG tablet Take 1,000 mcg by mouth daily.     Allergies:   Aspirin, Baclofen, Codeine, Imitrex  [sumatriptan], Influenza vaccines, and Vicodin  [hydrocodone-acetaminophen]   Social History   Socioeconomic History    Marital status: Married    Spouse name: Not on file   Number of children: Not on file   Years of education: Not on file   Highest education level: Not on file  Occupational History   Not on file  Tobacco Use   Smoking status: Former    Packs/day: 0.50    Years: 20.00    Pack years: 10.00    Types: Cigarettes    Quit date: 12/15/1978    Years since quitting: 43.2   Smokeless tobacco: Never  Vaping Use   Vaping Use: Never used  Substance and Sexual Activity   Alcohol use: No  Alcohol/week: 0.0 standard drinks   Drug use: No   Sexual activity: Never  Other Topics Concern   Not on file  Social History Narrative   Lives with husband    Occupation: retired, was Art therapist   Edu: some college   Activity: stationary bicycle   Diet: good water, following slim fast diet, good vegetables   Social Determinants of Radio broadcast assistant Strain: Not on file  Food Insecurity: Not on file  Transportation Needs: Not on file  Physical Activity: Not on file  Stress: Not on file  Social Connections: Not on file     Family History: The patient's family history includes Alcohol abuse in her father; Alzheimer's disease in her maternal grandfather; Cancer in her mother; Dementia in her mother; Depression in her mother; Heart attack in her brother, brother, and father; Heart disease in her father; Hypertension in her brother; Osteoporosis in her father; Stroke in her brother and paternal grandfather; Stroke (age of onset: 24) in her mother; Stroke (age of onset: 35) in her brother. There is no history of Breast cancer.  ROS:   Please see the history of present illness.     All other systems reviewed and are negative.  EKGs/Labs/Other Studies Reviewed:    The following studies were reviewed today:   EKG:  EKG is  ordered today.  The ekg ordered today demonstrates normal sinus rhythm, possible old septal infarct, heart rate 69   Recent Labs: 07/30/2021: ALT 26; TSH  4.06 10/16/2021: Hemoglobin 14.8; Platelets 173 01/16/2022: BUN 16; Creatinine, Ser 0.86; Potassium 3.8; Sodium 140  Recent Lipid Panel    Component Value Date/Time   CHOL 162 07/30/2021 0921   CHOL 144 11/19/2015 1119   TRIG 269.0 (H) 07/30/2021 0921   HDL 44.50 07/30/2021 0921   HDL 42 11/19/2015 1119   CHOLHDL 4 07/30/2021 0921   VLDL 53.8 (H) 07/30/2021 0921   LDLCALC 62 08/03/2020 0848   LDLCALC 71 11/19/2015 1119   LDLDIRECT 87.0 07/30/2021 0921    Physical Exam:    VS:  BP 126/78 (BP Location: Left Arm, Patient Position: Sitting, Cuff Size: Normal)    Pulse 69    Ht 5\' 4"  (1.626 m)    Wt 158 lb (71.7 kg)    SpO2 97%    BMI 27.12 kg/m     Wt Readings from Last 3 Encounters:  02/17/22 158 lb (71.7 kg)  01/29/22 158 lb (71.7 kg)  01/16/22 157 lb (71.2 kg)     GEN:  Well nourished, well developed in no acute distress HEENT: Normal NECK: No JVD; No carotid bruits LYMPHATICS: No lymphadenopathy CARDIAC: RRR, no murmurs, rubs, gallops RESPIRATORY:  Clear to auscultation without rales, wheezing or rhonchi  ABDOMEN: Soft, non-tender, non-distended MUSCULOSKELETAL:  No edema; left ankle cast boot noted SKIN: Warm and dry NEUROLOGIC:  Alert and oriented x 3 PSYCHIATRIC:  Normal affect   ASSESSMENT:    1. Precordial pain   2. Paroxysmal atrial fibrillation (HCC)   3. Pure hypercholesterolemia     PLAN:    In order of problems listed above:  Chest pain, risk factors hyperlipidemia, former smoker.  Echo with preserved EF.  Coronary CTA 01/27/2022 calcium score 0, no evidence of CAD.  Patient made aware of results, reassured.  Currently denies symptoms of chest pain. paroxysmal atrial fibrillation s/p RFA 10/2021, currently in sinus rhythm.  Denies further episodes of palpitations.  Continue Toprol-XL,  Eliquis.  Keep appointment with EP. hyperlipidemia, continue Lipitor.  Follow up in 1 year  Medication Adjustments/Labs and Tests Ordered: Current medicines are  reviewed at length with the patient today.  Concerns regarding medicines are outlined above.  Orders Placed This Encounter  Procedures   EKG 12-Lead     No orders of the defined types were placed in this encounter.    Patient Instructions  Medication Instructions:  Your physician recommends that you continue on your current medications as directed. Please refer to the Current Medication list given to you today.  *If you need a refill on your cardiac medications before your next appointment, please call your pharmacy*   Lab Work: None ordered If you have labs (blood work) drawn today and your tests are completely normal, you will receive your results only by: [place] (if you have MyChart) OR A paper copy in the mail If you have any lab test that is abnormal or we need to change your treatment, we will call you to review the results.   Testing/Procedures: None ordered   Follow-Up: At [facility], you and your health needs are our priority.  As part of our continuing mission to provide you with exceptional heart care, we have created designated Provider Care Teams.  These Care Teams include your primary Cardiologist (physician) and Advanced Practice Providers (APPs -  Physician Assistants and Nurse Practitioners) who all work together to provide you with the care you need, when you need it.  We recommend signing up for the patient portal called "MyChart".  Sign up information is provided on this After Visit Summary.  MyChart is used to connect with patients for Virtual Visits (Telemedicine).  Patients are able to view lab/test results, encounter notes, upcoming appointments, etc.  Non-urgent messages can be sent to your provider as well.   To learn more about what you can do with MyChart, go to [email or web address].    Your next appointment:   1 year(s)  The format for your next appointment:   In Person  Provider:   You may see Kate Sable, MD or one of the  following Advanced Practice Providers on your designated Care Team:   Murray Hodgkins, NP Christell Faith, PA-C Cadence Kathlen Mody, [place]    Other Instructions     Signed, Kate Sable, MD  02/17/2022 12:33 PM    [place]

## 2022-02-17 NOTE — Patient Instructions (Signed)

## 2022-02-19 ENCOUNTER — Other Ambulatory Visit: Payer: Self-pay

## 2022-02-19 ENCOUNTER — Encounter: Payer: Self-pay | Admitting: Radiation Oncology

## 2022-02-19 ENCOUNTER — Ambulatory Visit
Admission: RE | Admit: 2022-02-19 | Discharge: 2022-02-19 | Disposition: A | Discharge status: Home / Self Care | Payer: Medicare Other | Source: Ambulatory Visit | Attending: Radiation Oncology | Admitting: Radiation Oncology

## 2022-02-19 ENCOUNTER — Other Ambulatory Visit: Payer: Self-pay | Admitting: *Deleted

## 2022-02-19 VITALS — BP 144/57 | HR 67 | Temp 97.3°F | Resp 18 | Wt 158.3 lb

## 2022-02-19 DIAGNOSIS — R911 Solitary pulmonary nodule: Secondary | ICD-10-CM

## 2022-02-19 DIAGNOSIS — C44311 Basal cell carcinoma of skin of nose: Secondary | ICD-10-CM

## 2022-02-19 DIAGNOSIS — Z923 Personal history of irradiation: Secondary | ICD-10-CM | POA: Insufficient documentation

## 2022-02-19 DIAGNOSIS — Z85828 Personal history of other malignant neoplasm of skin: Secondary | ICD-10-CM | POA: Insufficient documentation

## 2022-02-19 DIAGNOSIS — Z08 Encounter for follow-up examination after completed treatment for malignant neoplasm: Secondary | ICD-10-CM | POA: Diagnosis not present

## 2022-02-19 NOTE — Progress Notes (Signed)
Radiation Oncology ?Follow up Note ? ?Name: Alexandria Moreno   ?Date:   02/19/2022 ?MRN:  [id number] ?DOB: 1941/02/20  ? ? ?This 81 y.o. female presents to the clinic today for 50-month follow-up status post electron-beam therapy to her nose for superficial nodular basal cell carcinoma. ? ?REFERRING PROVIDER: Ria Bush, MD ? ?HPI: Patient is a 81 year old female now out 18 months having completed electron-beam therapy to her nose for a superficial nodular basal cell carcinoma seen today in routine follow-up she has no evidence of disease visibly.  She is doing well she has been followed for on CT scans.  Ordered by her cardiologist showing a stable pulmonary nodule in the anterior basal right upper lobe.  They have recommended 1 year follow-up.  She is completely asymptomatic. ? ?COMPLICATIONS OF TREATMENT: none ? ?FOLLOW UP COMPLIANCE: keeps appointments  ? ?PHYSICAL EXAM:  ?BP (!) 144/57 (BP Location: Left Arm, Patient Position: Sitting)   Pulse 67   Temp (!) 97.3 ?F (36.3 ?C) (Tympanic)   Resp 18   Wt 158 lb 4.8 oz (71.8 kg)   BMI 27.17 kg/m?  ?Area of the nose shows no evidence of nodularity or mass.  Cosmetic result is excellent.  No cervical nodes were appreciated.  Well-developed well-nourished patient in NAD. HEENT reveals PERLA, EOMI, discs not visualized.  Oral cavity is clear. No oral mucosal lesions are identified. Neck is clear without evidence of cervical or supraclavicular adenopathy. Lungs are clear to A&P. Cardiac examination is essentially unremarkable with regular rate and rhythm without murmur rub or thrill. Abdomen is benign with no organomegaly or masses noted. Motor sensory and DTR levels are equal and symmetric in the upper and lower extremities. Cranial nerves II through XII are grossly intact. Proprioception is intact. No peripheral adenopathy or edema is identified. No motor or sensory levels are noted. Crude visual fields are within normal range. ? ?RADIOLOGY RESULTS: I have  reviewed her CT scans of her chest.  Of ordered a follow-up CT scan in 6 months with a follow-up appointment shortly thereafter.   ?PLAN: Present time patient is no evidence of disease from her nasal basal cell carcinoma.  I have ordered a CT scan and follow-up in 6 months and will review that with her at the time of follow-up.  Patient is to call with any concerns. ? ?I would like to take this opportunity to thank you for allowing me to participate in the care of your patient.. ?  ? Noreene Filbert, MD ? ?

## 2022-05-13 ENCOUNTER — Ambulatory Visit (INDEPENDENT_AMBULATORY_CARE_PROVIDER_SITE_OTHER): Payer: Medicare Other | Admitting: Dermatology

## 2022-05-13 DIAGNOSIS — L820 Inflamed seborrheic keratosis: Secondary | ICD-10-CM

## 2022-05-13 DIAGNOSIS — L719 Rosacea, unspecified: Secondary | ICD-10-CM | POA: Diagnosis not present

## 2022-05-13 DIAGNOSIS — L309 Dermatitis, unspecified: Secondary | ICD-10-CM

## 2022-05-13 DIAGNOSIS — L821 Other seborrheic keratosis: Secondary | ICD-10-CM

## 2022-05-13 DIAGNOSIS — Z85828 Personal history of other malignant neoplasm of skin: Secondary | ICD-10-CM

## 2022-05-13 DIAGNOSIS — D692 Other nonthrombocytopenic purpura: Secondary | ICD-10-CM | POA: Diagnosis not present

## 2022-05-13 DIAGNOSIS — D1800 Hemangioma unspecified site: Secondary | ICD-10-CM

## 2022-05-13 DIAGNOSIS — L814 Other melanin hyperpigmentation: Secondary | ICD-10-CM

## 2022-05-13 DIAGNOSIS — I8393 Asymptomatic varicose veins of bilateral lower extremities: Secondary | ICD-10-CM | POA: Diagnosis not present

## 2022-05-13 DIAGNOSIS — L578 Other skin changes due to chronic exposure to nonionizing radiation: Secondary | ICD-10-CM | POA: Diagnosis not present

## 2022-05-13 DIAGNOSIS — Z1283 Encounter for screening for malignant neoplasm of skin: Secondary | ICD-10-CM | POA: Diagnosis not present

## 2022-05-13 DIAGNOSIS — D229 Melanocytic nevi, unspecified: Secondary | ICD-10-CM

## 2022-05-13 MED ORDER — AZELAIC ACID 15 % EX GEL: 1.0000 "application " | CUTANEOUS | 4 refills | Status: AC

## 2022-05-13 NOTE — Patient Instructions (Signed)

## 2022-05-13 NOTE — Progress Notes (Signed)
Follow-Up Visit   Subjective  Alexandria Moreno is a 81 y.o. female who presents for the following: Total body skin exam (Hx of BCCs, Hx of SCC) and Rosacea (Face, finacea gel).  The patient presents for Total-Body Skin Exam (TBSE) for skin cancer screening and mole check.  The patient has spots, moles and lesions to be evaluated, some may be new or changing and the patient has concerns that these could be cancer.   The following portions of the chart were reviewed this encounter and updated as appropriate:       Review of Systems:  No other skin or systemic complaints except as noted in HPI or Assessment and Plan.  Objective  Well appearing patient in no apparent distress; mood and affect are within normal limits.  A full examination was performed including scalp, head, eyes, ears, nose, lips, neck, chest, axillae, abdomen, back, buttocks, bilateral upper extremities, bilateral lower extremities, hands, feet, fingers, toes, fingernails, and toenails. All findings within normal limits unless otherwise noted below.  face Erythema cheeks, nose  L lower cheek, R lat cheek 2.0cm light pink patch L lower cheek R lat cheek with pink waxy flesh clustered papules     hands Pink scaly patch R 5th mcp    Assessment & Plan   Lentigines - Scattered tan macules - Due to sun exposure - Benign-appearing, observe - Recommend daily broad spectrum sunscreen SPF 30+ to sun-exposed areas, reapply every 2 hours as needed. - Call for any changes - shoulders  Seborrheic Keratoses - Stuck-on, waxy, tan-brown papules and/or plaques  - Benign-appearing - Discussed benign etiology and prognosis. - Observe - Call for any changes  Melanocytic Nevi - Tan-brown and/or pink-flesh-colored symmetric macules and papules - Benign appearing on exam today - Observation - Call clinic for new or changing moles - Recommend daily use of broad spectrum spf 30+ sunscreen to sun-exposed areas.  -  back  Hemangiomas - Red papules - Discussed benign nature - Observe - Call for any changes - R upper arm  Purpura - Chronic; persistent and recurrent.  Treatable, but not curable. - Violaceous macules and patches - Benign - Related to trauma, age, sun damage and/or use of blood thinners, chronic use of topical and/or oral steroids - Observe - Can use OTC arnica containing moisturizer such as Dermend Bruise Formula if desired - Call for worsening or other concerns   Actinic Damage - Chronic condition, secondary to cumulative UV/sun exposure - diffuse scaly erythematous macules with underlying dyspigmentation - Recommend daily broad spectrum sunscreen SPF 30+ to sun-exposed areas, reapply every 2 hours as needed.  - Staying in the shade or wearing long sleeves, sun glasses (UVA+UVB protection) and wide brim hats (4-inch brim around the entire circumference of the hat) are also recommended for sun protection.  - Call for new or changing lesions. - chest  Skin cancer screening performed today.  History of Basal Cell Carcinoma of the Skin - No evidence of recurrence today - Recommend regular full body skin exams - Recommend daily broad spectrum sunscreen SPF 30+ to sun-exposed areas, reapply every 2 hours as needed.  - Call if any new or changing lesions are noted between office visits  - multiple, L nasal ala clear from radiation  History of Squamous Cell Carcinoma of the Skin - No evidence of recurrence today - Recommend regular full body skin exams - Recommend daily broad spectrum sunscreen SPF 30+ to sun-exposed areas, reapply every 2 hours as needed.  -  Call if any new or changing lesions are noted between office visits - L upper forehead  Varicose Veins/Spider Veins - Dilated blue, purple or red veins at the left lower extremity - Reassured - Smaller vessels can be treated by sclerotherapy (a procedure to inject a medicine into the veins to make them disappear) if  desired, but the treatment is not covered by insurance. Larger vessels may be covered if symptomatic and we would refer to vascular surgeon if treatment desired.    Rosacea face  Chronic condition with duration or expected duration over one year. Currently well-controlled.   Rosacea is a chronic progressive skin condition usually affecting the face of adults, causing redness and/or acne bumps. It is treatable but not curable. It sometimes affects the eyes (ocular rosacea) as well. It may respond to topical and/or systemic medication and can flare with stress, sun exposure, alcohol, exercise and some foods.  Daily application of broad spectrum spf 30+ sunscreen to face is recommended to reduce flares.  Cont Finacea gel qhs to face  Azelaic Acid (FINACEA) 15 % gel - face Apply 1 application. topically as directed. After skin is thoroughly washed and patted dry, gently but thoroughly massage a thin film of azelaic acid cream into the affected area twice daily, in the morning and evening.  Inflamed seborrheic keratosis L lower cheek, R lat cheek  No change with topical mometasone  L lower cheek, Benign, observe  R lat cheek with recurrence post cryotherapy, not symptomatic, will observe for now  Reassured benign age-related growth.  Recommend observation.  Discussed cryotherapy if spot(s) become irritated or inflamed.   Related Medications mometasone (ELOCON) 0.1 % cream Apply to affected areas lower legs 1-2 times a day until improved.  Hand dermatitis hands  Stable with treatment  Hand Dermatitis is a chronic type of eczema that can come and go on the hands and fingers.  While there is no cure, the rash and symptoms can be managed with topical prescription medications, and for more severe cases, with systemic medications.  Recommend mild soap and routine use of moisturizing cream after handwashing.  Minimize soap/water exposure when possible.     Cont Mometasone cr qd/bid prn  flares  Topical steroids (such as triamcinolone, fluocinolone, fluocinonide, mometasone, clobetasol, halobetasol, betamethasone, hydrocortisone) can cause thinning and lightening of the skin if they are used for too long in the same area. Your physician has selected the right strength medicine for your problem and area affected on the body. Please use your medication only as directed by your physician to prevent side effects.     Return in about 6 months (around 11/13/2022) for UBSE.  I, Othelia Pulling, RMA, am acting as scribe for Brendolyn Patty, MD .  Documentation: I have reviewed the above documentation for accuracy and completeness, and I agree with the above.  Brendolyn Patty MD

## 2022-06-09 ENCOUNTER — Other Ambulatory Visit (HOSPITAL_COMMUNITY)
Admission: RE | Admit: 2022-06-09 | Discharge: 2022-06-09 | Disposition: A | Discharge status: Home / Self Care | Payer: Medicare Other | Source: Ambulatory Visit | Attending: Family | Admitting: Family

## 2022-06-09 ENCOUNTER — Ambulatory Visit (INDEPENDENT_AMBULATORY_CARE_PROVIDER_SITE_OTHER): Payer: Medicare Other | Admitting: Family

## 2022-06-09 VITALS — BP 136/80 | HR 86 | Temp 97.9°F | Ht 63.6 in

## 2022-06-09 DIAGNOSIS — B3731 Acute candidiasis of vulva and vagina: Secondary | ICD-10-CM | POA: Insufficient documentation

## 2022-06-09 DIAGNOSIS — N3000 Acute cystitis without hematuria: Secondary | ICD-10-CM | POA: Insufficient documentation

## 2022-06-09 MED ORDER — FLUCONAZOLE 150 MG PO TABS: 150.0000 mg | ORAL_TABLET | Freq: Once | ORAL | 0 refills | Status: AC

## 2022-06-09 MED ORDER — CIPROFLOXACIN HCL 500 MG PO TABS: 500.0000 mg | ORAL_TABLET | Freq: Two times a day (BID) | ORAL | 0 refills | Status: DC

## 2022-06-09 NOTE — Progress Notes (Signed)
Established Patient Office Visit  Subjective:  Patient ID: Alexandria Moreno, female    DOB: [birth year]  Age: 81 y.o. MRN: [id number]  CC: No chief complaint on file.   HPI Alexandria Moreno is here today with concerns.   Five days ago started with vaginal itching, went to pharmacy and started with otc medications that are for vaginal itching and burning. Also used some vagisil for itching and burning.  With urinary incontinence and now increased urine frequency (every 15 minutes or so) and dsyuria.  Did take some antbx and opthalmologist had her taking   Also with generalized lower back pain.  Constant dull ache.   Past Medical History:  Diagnosis Date   Basal cell carcinoma 05/22/2015   R nasal ala   Basal cell carcinoma 07/21/2017   L upper arm, L neck, L medial shoulder   Basal cell carcinoma 09/21/2018   R shoulder   Basal cell carcinoma 05/01/2020   L lower neck    Basal cell carcinoma 05/01/2020   L neck ant to scar    Basal cell carcinoma 11/05/2020   L nasal ala, refer for Radiation   Diastolic dysfunction    a. 09/2019 Echo: EF 60-65%, nl RV size/fxn. Nl Atrial sizes. Triv TR. Mild AI; b. 11/2020 Echo: EF 60-65%, no rwma, Gr2DD, nl RV size/fxn, RVSP 34.52mmHg. Mild MR/AI.   Diverticulosis    DJD (degenerative joint disease) of knee    GERD (gastroesophageal reflux disease)    H/o Iron deficiency anemia 2009   a. 05/2008 EGD/Colonoscopy: nl w/o evidence of bleeding; b. 11/2017 EGD: nl. Colonoscopy: 2mm cecal polyp, otw nl.   H/O transfusion of whole blood 2009   H/O: rheumatic fever    81 years old   History of chicken pox    History of stress test    a. 10/2019 EF 53%, no ischemia/infarct. No significant cor Ca2+. Mild thoracic desc Ao atherosclerosis.   Hx of basal cell carcinoma 2010   L dorsum tip of nose (MOHS), R ant alar groove (MOHS)   Hx of squamous cell carcinoma 11/01/2018   SCC is L upper forehead   Hyperlipidemia    Metatarsal fracture 09/2020   L  5th shaft s/p conservative management Hyacinth Meeker)   Migraines    PAF (paroxysmal atrial fibrillation) (HCC)    a. 08/2019 Zio: Avg HR 60 (min 43, max 144). PAF (2% burden), longest 1h 16m (avg rate 77). Rare PAC's and PVC's.   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   L upper forehead   Stress incontinence     Past Surgical History:  Procedure Laterality Date   ABDOMINAL HYSTERECTOMY  1981   APPENDECTOMY  1960   ATRIAL FIBRILLATION ABLATION N/A 10/28/2021   Procedure: ATRIAL FIBRILLATION ABLATION;  Surgeon: Lanier Prude, MD;  Location: [facility];  Service: Cardiovascular;  Laterality: N/A;   capsule endoscopy  2009   WNL   CATARACT EXTRACTION Bilateral [id number]   COLONOSCOPY  2009   diverticulosis ([facility])   COLONOSCOPY WITH PROPOFOL N/A 12/01/2017   TA, rpt 5 yrs ([facility])   DEXA  06/2016   T -0.2 hip, 0.7 spine   ESOPHAGOGASTRODUODENOSCOPY  2009   focal mild chronic gastritis, neg H pylori ([facility])   ESOPHAGOGASTRODUODENOSCOPY (EGD) WITH PROPOFOL N/A 12/01/2017   Procedure: ESOPHAGOGASTRODUODENOSCOPY (EGD) WITH PROPOFOL;  Surgeon: Midge Minium, MD;  Location: [facility] ENDOSCOPY;  Service: Endoscopy;  Laterality: N/A;   INCONTINENCE SURGERY  2001   with mesh   LIPOMA EXCISION  1971   right shoulder   MOHS SURGERY     [facility]   OVARIAN CYST REMOVAL  1960   SALPINGOOPHORECTOMY Left 2001   TONSILLECTOMY  1958    Family History  Problem Relation Age of Onset   Stroke Mother 76   Cancer Mother        Cervical   Depression Mother    Dementia Mother        vascular   Heart disease Father        CABG   Alcohol abuse Father    Osteoporosis Father    Heart attack Father    Hypertension Brother    Heart attack Brother    Stroke Brother    Stroke Brother 63   Heart attack Brother    Stroke Paternal Grandfather    Alzheimer's disease Maternal Grandfather     Breast cancer Neg Hx     Social History   Socioeconomic History   Marital status: Married    Spouse name: Not on file   Number of children: Not on file   Years of education: Not on file   Highest education level: Not on file  Occupational History   Not on file  Tobacco Use   Smoking status: Former    Packs/day: 0.50    Years: 20.00    Total pack years: 10.00    Types: Cigarettes    Quit date: 12/15/1978    Years since quitting: 43.5   Smokeless tobacco: Never  Vaping Use   Vaping Use: Never used  Substance and Sexual Activity   Alcohol use: No    Alcohol/week: 0.0 standard drinks of alcohol   Drug use: No   Sexual activity: Never  Other Topics Concern   Not on file  Social History Narrative   Lives with husband    Occupation: retired, was Sales executive   Edu: some college   Activity: stationary bicycle   Diet: good water, following slim fast diet, good vegetables   Social Determinants of Health   Financial Resource Strain: Not on file  Food Insecurity: Not on file  Transportation Needs: Not on file  Physical Activity: Insufficiently Active (07/27/2020)   Exercise Vital Sign    Days of Exercise per Week: 1 day    Minutes of Exercise per Session: 60 min  Stress: Not on file  Social Connections: Not on file  Intimate Partner Violence: Not on file    Outpatient Medications Prior to Visit  Medication Sig Dispense Refill   apixaban (ELIQUIS) 5 MG TABS tablet TAKE 1 TABLET TWICE A DAY 180 tablet 2   atorvastatin (LIPITOR) 40 MG tablet TAKE 1 TABLET DAILY 90 tablet 3   Azelaic Acid (FINACEA) 15 % gel Apply 1 application. topically as directed. After skin is thoroughly washed and patted dry, gently but thoroughly massage a thin film of azelaic acid cream into the affected area twice daily, in the morning and evening. 150 g 4   Calcium Carbonate-Vitamin D (CALTRATE 600+D PO) Take 1 tablet by mouth daily.     chlorhexidine (PERIDEX) 0.12 % solution Use as directed 5 mLs  in the mouth or throat daily as needed (dental procedure).  0   cholecalciferol (VITAMIN D3) 25 MCG (1000 UNIT) tablet Take 1,000 Units by mouth daily.     cycloSPORINE (RESTASIS) 0.05 %  ophthalmic emulsion Apply 1 drop to eye 2 (two) times daily.     diphenoxylate-atropine (LOMOTIL) 2.5-0.025 MG tablet Take 1 tablet by mouth 4 (four) times daily as needed for diarrhea or loose stools.     GLUCOSAMINE-CHONDROITIN PO Take 1 tablet by mouth 2 (two) times daily.     levothyroxine (SYNTHROID) 25 MCG tablet Take 1 tablet (25 mcg total) by mouth daily. 90 tablet 3   Methylcellulose, Laxative, (FIBER THERAPY PO) Take 2 capsules by mouth daily.     metoprolol succinate (TOPROL-XL) 25 MG 24 hr tablet Take 0.5 tablets (12.5 mg total) by mouth daily. 45 tablet 3   mometasone (ELOCON) 0.1 % cream Apply to affected areas lower legs 1-2 times a day until improved. 45 g 0   MULTIPLE VITAMIN PO Take 1 tablet by mouth daily.     pantoprazole (PROTONIX) 40 MG tablet Take 1 tablet (40 mg total) by mouth daily. 90 tablet 3   rizatriptan (MAXALT) 10 MG tablet Take 10 mg by mouth as needed for migraine.     topiramate (TOPAMAX) 50 MG tablet Take 50 mg by mouth 2 (two) times daily.     vitamin B-12 (CYANOCOBALAMIN) 1000 MCG tablet Take 1,000 mcg by mouth daily.     No facility-administered medications prior to visit.    Allergies  Allergen Reactions   Aspirin Other (See Comments)    GI bleed   Baclofen Diarrhea   Codeine Nausea And Vomiting   Imitrex  [Sumatriptan]     Palpatations   Influenza Vaccines Swelling   Vicodin  [Hydrocodone-Acetaminophen] Nausea And Vomiting        Objective:    Physical Exam Constitutional:      General: She is not in acute distress.    Appearance: Normal appearance. She is obese. She is not ill-appearing, toxic-appearing or diaphoretic.  Cardiovascular:     Rate and Rhythm: Normal rate and regular rhythm.  Abdominal:     General: Abdomen is flat.     Palpations:  Abdomen is soft.     Tenderness: There is abdominal tenderness (mild suprapubic tenderness). There is no right CVA tenderness, left CVA tenderness or guarding.  Neurological:     General: No focal deficit present.     Mental Status: She is alert and oriented to person, place, and time.  Psychiatric:        Mood and Affect: Mood normal.        Behavior: Behavior normal.        Thought Content: Thought content normal.        Judgment: Judgment normal.     BP 136/80   Pulse 86   Temp 97.9 F (36.6 C)   Ht 5' 3.6" (1.615 m)   BMI 27.51 kg/m  Wt Readings from Last 3 Encounters:  02/19/22 158 lb 4.8 oz (71.8 kg)  02/17/22 158 lb (71.7 kg)  01/29/22 158 lb (71.7 kg)     Health Maintenance Due  Topic Date Due   Zoster Vaccines- Shingrix (1 of 2) Never done   COVID-19 Vaccine (4 - Booster for Pfizer series) 12/17/2020    There are no preventive care reminders to display for this patient.  Lab Results  Component Value Date   TSH 4.06 07/30/2021   Lab Results  Component Value Date   WBC 4.3 10/16/2021   HGB 14.8 10/16/2021   HCT 42.4 10/16/2021   MCV 92 10/16/2021   PLT 173 10/16/2021   Lab Results  Component Value Date  NA 140 01/16/2022   K 3.8 01/16/2022   CO2 24 01/16/2022   GLUCOSE 99 01/16/2022   BUN 16 01/16/2022   CREATININE 0.86 01/16/2022   BILITOT 0.6 07/30/2021   ALKPHOS 92 07/30/2021   AST 24 07/30/2021   ALT 26 07/30/2021   PROT 6.6 07/30/2021   ALBUMIN 4.3 07/30/2021   CALCIUM 9.0 01/16/2022   ANIONGAP 5 01/16/2022   EGFR 68 10/16/2021   GFR 58.92 (L) 07/30/2021   No results found for: "HGBA1C"    Assessment & Plan:   Problem List Items Addressed This Visit       Genitourinary   Candida vaginitis    rx diflucan 150 mg once daily po qd for one dose      Relevant Medications   fluconazole (DIFLUCAN) 150 MG tablet   Other Relevant Orders   Urine cytology ancillary only   Acute cystitis without hematuria - Primary    antbx sent to  pharmacy, pt to take as directed. Encouraged increased water intake throughout the day. Urine culture/reflex pending results. Choosing to treat due to being symptomatic. If no improvement in the next 2 days pt advised to let me know.  rx sent for ciprofloxacin 500 mg po bid x 3 days      Relevant Medications   ciprofloxacin (CIPRO) 500 MG tablet   fluconazole (DIFLUCAN) 150 MG tablet   Other Relevant Orders   Urine cytology ancillary only   Urine Culture    Meds ordered this encounter  Medications   ciprofloxacin (CIPRO) 500 MG tablet    Sig: Take 1 tablet (500 mg total) by mouth 2 (two) times daily for 3 days.    Dispense:  6 tablet    Refill:  0    Order Specific Question:   Supervising Provider    Answer:   BEDSOLE, AMY E [[id number]]   fluconazole (DIFLUCAN) 150 MG tablet    Sig: Take 1 tablet (150 mg total) by mouth once for 1 dose.    Dispense:  1 tablet    Refill:  0    Order Specific Question:   Supervising Provider    Answer:   BEDSOLE, AMY E [[id number]]    Follow-up: Return if symptoms worsen or fail to improve with pcp.    Mort Sawyers, FNP

## 2022-06-10 LAB — URINE CULTURE
MICRO NUMBER:: 13571907
Result:: NO GROWTH
SPECIMEN QUALITY:: ADEQUATE

## 2022-06-10 LAB — URINE CYTOLOGY ANCILLARY ONLY
Bacterial Vaginitis-Urine: NEGATIVE
Candida Urine: NEGATIVE

## 2022-06-11 ENCOUNTER — Ambulatory Visit (INDEPENDENT_AMBULATORY_CARE_PROVIDER_SITE_OTHER): Payer: Medicare Other | Admitting: Family

## 2022-06-11 ENCOUNTER — Encounter: Payer: Self-pay | Admitting: Family

## 2022-06-11 ENCOUNTER — Ambulatory Visit: Payer: Medicare Other | Admitting: Family Medicine

## 2022-06-11 VITALS — BP 130/64 | HR 75 | Temp 98.9°F | Resp 16 | Ht 63.6 in | Wt 159.5 lb

## 2022-06-11 DIAGNOSIS — N3946 Mixed incontinence: Secondary | ICD-10-CM | POA: Diagnosis not present

## 2022-06-11 DIAGNOSIS — L245 Irritant contact dermatitis due to other chemical products: Secondary | ICD-10-CM | POA: Diagnosis not present

## 2022-06-11 MED ORDER — CLOBETASOL PROPIONATE 0.05 % EX OINT: 1.0000 | TOPICAL_OINTMENT | Freq: Two times a day (BID) | CUTANEOUS | 0 refills | Status: AC

## 2022-06-11 NOTE — Assessment & Plan Note (Signed)
Suspected dermatitis due to lume chemical rx clobetasol  Cold compresses to site throughout day  Calamine lotion prn aveeno oatmeal prn  Please let me know if no improvement in next few days rx clobetasol ointment, apply lightly to area, not internally. benadryll prn at night for itching

## 2022-06-11 NOTE — Patient Instructions (Addendum)
Calamine lotion at night if needed for itching.  Cold compresses throughout the day for relief Oatmeal aveeno baths as needed   A referral was placed today for urology. Please let us know if you have not heard back within 2 weeks about the referral.   Due to recent changes in healthcare laws, you may see results of your imaging and/or laboratory studies on MyChart before I have had a chance to review them.  I understand that in some cases there may be results that are confusing or concerning to you. Please understand that not all results are received at the same time and often I may need to interpret multiple results in order to provide you with the best plan of care or course of treatment. Therefore, I ask that you please give me 2 business days to thoroughly review all your results before contacting my office for clarification. Should we see a critical lab result, you will be contacted sooner.   It was a pleasure seeing you today! Please do not hesitate to reach out with any questions and or concerns.  Regards,   Eugenia Pancoast FNP-C

## 2022-06-11 NOTE — Assessment & Plan Note (Addendum)
Referral to urology  H/o prolapsed uterus

## 2022-06-11 NOTE — Progress Notes (Signed)
Established Patient Office Visit  Subjective:  Patient ID: Alexandria Moreno, female    DOB: November 28, 1941  Age: 81 y.o. MRN: [id number]  CC:  Chief Complaint  Patient presents with   Vaginal Itching    Follow up      HPI Alexandria Moreno is here today with concerns.  Urinary incontinence: has been ongoing. Worse with sneezing or coughing.  Has to wear a pad daily. Negative for uti.   Using otc lume and she believes this has been causing her irritation in the area. She is unable to sleep due to irritation. She originally put it on that am of first appt.   Past Medical History:  Diagnosis Date   Basal cell carcinoma 05/22/2015   R nasal ala   Basal cell carcinoma 07/21/2017   L upper arm, L neck, L medial shoulder   Basal cell carcinoma 09/21/2018   R shoulder   Basal cell carcinoma 05/01/2020   L lower neck    Basal cell carcinoma 05/01/2020   L neck ant to scar    Basal cell carcinoma 11/05/2020   L nasal ala, refer for Radiation   Diastolic dysfunction    a. 09/2019 Echo: EF 60-65%, nl RV size/fxn. Nl Atrial sizes. Triv TR. Mild AI; b. 11/2020 Echo: EF 60-65%, no rwma, Gr2DD, nl RV size/fxn, RVSP 34.76mHg. Mild MR/AI.   Diverticulosis    DJD (degenerative joint disease) of knee    GERD (gastroesophageal reflux disease)    H/o Iron deficiency anemia 2009   a. 05/2008 EGD/Colonoscopy: nl w/o evidence of bleeding; b. 11/2017 EGD: nl. Colonoscopy: 244mcecal polyp, otw nl.   H/O transfusion of whole blood 2009   H/O: rheumatic fever    89ears old   History of chicken pox    History of stress test    a. 10/2019 EF 53%, no ischemia/infarct. No significant cor Ca2+. Mild thoracic desc Ao atherosclerosis.   Hx of basal cell carcinoma 2010   L dorsum tip of nose (MOHS), R ant alar groove (MOHS)   Hx of squamous cell carcinoma 11/01/2018   SCC is L upper forehead   Hyperlipidemia    Metatarsal fracture 09/2020   L 5th shaft s/p conservative management ([facility]  Migraines     [place]   a. 08/2019 Zio: Avg HR 60 (min 43, max 144). PAF (2% burden), longest 1h 2342mvg rate 77). Rare PAC's and PVC's.   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   L upper forehead   Stress incontinence     Past Surgical History:  Procedure Laterality Date   ABDSumnerA 10/28/2021   Procedure: ATRIAL FIBRILLATION ABLATION;  Surgeon: LamVickie EpleyD;  Location: [facility] [place] [facility];  Service: Cardiovascular;  Laterality: N/A;   capsule endoscopy  2009   WNL   CATARACT EXTRACTION Bilateral [id number]  2009   diverticulosis ([facility])   COLONOSCOPY WITH PROPOFOL N/A 12/01/2017   TA, rpt 5 yrs ([facility])   DEXA  06/2016   T -0.2 hip, 0.7 spine   ESOPHAGOGASTRODUODENOSCOPY  2009   focal mild chronic gastritis, neg H pylori ([facility])   ESOPHAGOGASTRODUODENOSCOPY (EGD) WITH PROPOFOL N/A 12/01/2017   Procedure: ESOPHAGOGASTRODUODENOSCOPY (EGD) WITH PROPOFOL;  Surgeon: Lucilla Lame, MD;  Location: [facility] ENDOSCOPY;  Service: Endoscopy;  Laterality: N/A;   INCONTINENCE SURGERY  2001   with mesh   [place]   right shoulder   MOHS SURGERY     [facility]   OVARIAN CYST REMOVAL  1960   SALPINGOOPHORECTOMY Left 2001   TONSILLECTOMY  1958    Family History  Problem Relation Age of Onset   Stroke Mother 67   Cancer Mother        Cervical   Depression Mother    Dementia Mother        vascular   Heart disease Father        CABG   Alcohol abuse Father    Osteoporosis Father    Heart attack Father    Hypertension Brother    Heart attack Brother    Stroke Brother    Stroke Brother 63   Heart attack Brother    Stroke Paternal Grandfather    Alzheimer's disease Maternal Grandfather    Breast cancer Neg Hx     Social History   Socioeconomic  History   Marital status: Married    Spouse name: Not on file   Number of children: Not on file   Years of education: Not on file   Highest education level: Not on file  Occupational History   Not on file  Tobacco Use   Smoking status: Former    Packs/day: 0.50    Years: 20.00    Total pack years: 10.00    Types: Cigarettes    Quit date: 12/15/1978    Years since quitting: 43.5   Smokeless tobacco: Never  Vaping Use   Vaping Use: Never used  Substance and Sexual Activity   Alcohol use: No    Alcohol/week: 0.0 standard drinks of alcohol   Drug use: No   Sexual activity: Never  Other Topics Concern   Not on file  Social History Narrative   Lives with husband    Occupation: retired, was Art therapist   Edu: some college   Activity: stationary bicycle   Diet: good water, following slim fast diet, good vegetables   Social Determinants of Health   Financial Resource Strain: Low Risk  (07/27/2020)   Overall Financial Resource Strain (CARDIA)    Difficulty of Paying Living Expenses: Not hard at all  Food Insecurity: No Food Insecurity (07/27/2020)   Hunger Vital Sign    Worried About Running Out of Food in the Last Year: Never true    Ran Out of Food in the Last Year: Never true  Transportation Needs: No Transportation Needs (07/27/2020)   PRAPARE - Hydrologist (Medical): No    Lack of Transportation (Non-Medical): No  Physical Activity: Insufficiently Active (07/27/2020)   Exercise Vital Sign    Days of Exercise per Week: 1 day    Minutes of Exercise per Session: 60 min  Stress: No Stress Concern Present (07/27/2020)   [place]    Feeling of Stress : Not at all  Social Connections: Not on file  Intimate Partner Violence: Not At Risk (07/27/2020)   Humiliation, Afraid, Rape, and Kick questionnaire    Fear of Current or Ex-Partner: No    Emotionally Abused: No    Physically  Abused: No    Sexually Abused: No    Outpatient Medications Prior to Visit  Medication Sig Dispense Refill   apixaban (ELIQUIS) 5 MG  TABS tablet TAKE 1 TABLET TWICE A DAY 180 tablet 2   atorvastatin (LIPITOR) 40 MG tablet TAKE 1 TABLET DAILY 90 tablet 3   Azelaic Acid (FINACEA) 15 % gel Apply 1 application. topically as directed. After skin is thoroughly washed and patted dry, gently but thoroughly massage a thin film of azelaic acid cream into the affected area twice daily, in the morning and evening. 150 g 4   Calcium Carbonate-Vitamin D (CALTRATE 600+D PO) Take 1 tablet by mouth daily.     chlorhexidine (PERIDEX) 0.12 % solution Use as directed 5 mLs in the mouth or throat daily as needed (dental procedure).  0   cholecalciferol (VITAMIN D3) 25 MCG (1000 UNIT) tablet Take 1,000 Units by mouth daily.     cycloSPORINE (RESTASIS) 0.05 % ophthalmic emulsion Apply 1 drop to eye 2 (two) times daily.     diphenoxylate-atropine (LOMOTIL) 2.5-0.025 MG tablet Take 1 tablet by mouth 4 (four) times daily as needed for diarrhea or loose stools.     GLUCOSAMINE-CHONDROITIN PO Take 1 tablet by mouth 2 (two) times daily.     levothyroxine (SYNTHROID) 25 MCG tablet Take 1 tablet (25 mcg total) by mouth daily. 90 tablet 3   Methylcellulose, Laxative, (FIBER THERAPY PO) Take 2 capsules by mouth daily.     metoprolol succinate (TOPROL-XL) 25 MG 24 hr tablet Take 0.5 tablets (12.5 mg total) by mouth daily. 45 tablet 3   mometasone (ELOCON) 0.1 % cream Apply to affected areas lower legs 1-2 times a day until improved. 45 g 0   MULTIPLE VITAMIN PO Take 1 tablet by mouth daily.     pantoprazole (PROTONIX) 40 MG tablet Take 1 tablet (40 mg total) by mouth daily. 90 tablet 3   rizatriptan (MAXALT) 10 MG tablet Take 10 mg by mouth as needed for migraine.     topiramate (TOPAMAX) 50 MG tablet Take 50 mg by mouth 2 (two) times daily.     vitamin B-12 (CYANOCOBALAMIN) 1000 MCG tablet Take 1,000 mcg by mouth daily.      ciprofloxacin (CIPRO) 500 MG tablet Take 1 tablet (500 mg total) by mouth 2 (two) times daily for 3 days. 6 tablet 0   No facility-administered medications prior to visit.    Allergies  Allergen Reactions   Aspirin Other (See Comments)    GI bleed   Baclofen Diarrhea   Codeine Nausea And Vomiting   Imitrex  [Sumatriptan]     Palpatations   Influenza Vaccines Swelling   Vicodin  [Hydrocodone-Acetaminophen] Nausea And Vomiting        Objective:    Physical Exam Constitutional:      General: She is not in acute distress.    Appearance: Normal appearance. She is normal weight. She is not ill-appearing, toxic-appearing or diaphoretic.  Pulmonary:     Effort: Pulmonary effort is normal.  Genitourinary:    Pubic Area: Rash (contact dermatitis with erythema and tenderness small blisters in central pubic area from contact) present.     Labia:        Right: Rash and tenderness present.        Left: Rash and tenderness present.      Vagina: Erythema and tenderness present.  Neurological:     Mental Status: She is alert.     BP 130/64   Pulse 75   Temp 98.9 F (37.2 C)   Resp 16   Ht 5' 3.6" (1.615 m)   Wt 159 lb 8 oz (72.3 kg)  SpO2 95%   BMI 27.72 kg/m  Wt Readings from Last 3 Encounters:  06/11/22 159 lb 8 oz (72.3 kg)  02/19/22 158 lb 4.8 oz (71.8 kg)  02/17/22 158 lb (71.7 kg)     Health Maintenance Due  Topic Date Due   Zoster Vaccines- Shingrix (1 of 2) Never done   COVID-19 Vaccine (4 - Booster for Pfizer series) 12/17/2020    There are no preventive care reminders to display for this patient.  Lab Results  Component Value Date   TSH 4.06 07/30/2021   Lab Results  Component Value Date   WBC 4.3 10/16/2021   HGB 14.8 10/16/2021   HCT 42.4 10/16/2021   MCV 92 10/16/2021   PLT 173 10/16/2021   Lab Results  Component Value Date   NA 140 01/16/2022   K 3.8 01/16/2022   CO2 24 01/16/2022   GLUCOSE 99 01/16/2022   BUN 16 01/16/2022   CREATININE  0.86 01/16/2022   BILITOT 0.6 07/30/2021   ALKPHOS 92 07/30/2021   AST 24 07/30/2021   ALT 26 07/30/2021   PROT 6.6 07/30/2021   ALBUMIN 4.3 07/30/2021   CALCIUM 9.0 01/16/2022   ANIONGAP 5 01/16/2022   EGFR 68 10/16/2021   GFR 58.92 (L) 07/30/2021   No results found for: "HGBA1C"    Assessment & Plan:   Problem List Items Addressed This Visit       Musculoskeletal and Integument   Irritant contact dermatitis due to other chemical products - Primary    Suspected dermatitis due to lume chemical rx clobetasol  Cold compresses to site throughout day  Calamine lotion prn aveeno oatmeal prn  Please let me know if no improvement in next few days rx clobetasol ointment, apply lightly to area, not internally. benadryll prn at night for itching       Relevant Medications   clobetasol ointment (TEMOVATE) 0.05 %     Other   Mixed stress and urge urinary incontinence    Referral to urology  H/o prolapsed uterus       Relevant Orders   Ambulatory referral to Urology    Meds ordered this encounter  Medications   clobetasol ointment (TEMOVATE) 0.05 %    Sig: Apply 1 Application topically 2 (two) times daily.    Dispense:  30 g    Refill:  0    Order Specific Question:   Supervising Provider    Answer:   BEDSOLE, AMY E [[id number]]    Follow-up: Return if symptoms worsen or fail to improve with pcp.    Eugenia Pancoast, FNP

## 2022-06-12 ENCOUNTER — Ambulatory Visit: Payer: Medicare Other | Admitting: Radiation Oncology

## 2022-06-24 ENCOUNTER — Ambulatory Visit (INDEPENDENT_AMBULATORY_CARE_PROVIDER_SITE_OTHER): Payer: Medicare Other | Admitting: Family Medicine

## 2022-06-24 ENCOUNTER — Encounter: Payer: Self-pay | Admitting: Family Medicine

## 2022-06-24 VITALS — BP 140/82 | HR 67 | Temp 98.1°F | Ht 64.0 in | Wt 160.4 lb

## 2022-06-24 DIAGNOSIS — L245 Irritant contact dermatitis due to other chemical products: Secondary | ICD-10-CM

## 2022-06-24 DIAGNOSIS — R300 Dysuria: Secondary | ICD-10-CM

## 2022-06-24 DIAGNOSIS — N3946 Mixed incontinence: Secondary | ICD-10-CM

## 2022-06-24 LAB — POC URINALSYSI DIPSTICK (AUTOMATED)
Bilirubin, UA: NEGATIVE
Glucose, UA: NEGATIVE
Ketones, UA: NEGATIVE
Leukocytes, UA: NEGATIVE
Nitrite, UA: NEGATIVE
Protein, UA: NEGATIVE
Spec Grav, UA: 1.01 (ref 1.010–1.025)
Urobilinogen, UA: 0.2 E.U./dL
pH, UA: 6 (ref 5.0–8.0)

## 2022-06-24 MED ORDER — OXYBUTYNIN CHLORIDE 5 MG PO TABS: 5.0000 mg | ORAL_TABLET | Freq: Two times a day (BID) | ORAL | 0 refills | Status: DC | PRN

## 2022-06-24 NOTE — Patient Instructions (Addendum)
Urine overall ok today. Will try to get you in to see urology sooner.  In interim, may continue clobetasol as it's helping and try oxybutynin 5mg  twice daily as needed for urinary symptoms.  May take ibuprofen 400mg  twice daily with meals as needed for bladder discomfort.  Do kegel exercises for stress incontinence.  Limit spicy foods, dark sodas, and caffeine.  Good to see you today.

## 2022-06-24 NOTE — Progress Notes (Unsigned)
Patient ID: Alexandria Moreno, female    DOB: September 07, 1941, 81 y.o.   MRN: [id number]  This visit was conducted in person.  BP 140/82   Pulse 67   Temp 98.1 F (36.7 C) (Temporal)   Ht 5\' 4"  (1.626 m)   Wt 160 lb 6 oz (72.7 kg)   SpO2 96%   BMI 27.53 kg/m    CC: urinary symptoms  Subjective:   HPI: Alexandria Moreno is a 81 y.o. female presenting on 06/24/2022 for Dysuria (C/o burning with urination, itching after urination, frequent urination and low back pain.  Seen a few wks ago for similar sxs. Using OTC vaginal itch wipe. Has appt with [facility] 07/07/22. )   Seen 2 wks ago with vaginal itching, urgency, dysuria treated with diflucan and cipro 500mg  3d course. UCx at that time returned no growth (she had previously been on antibiotics through eye doctor). She also tried OTC azo without significant benefit. She's never had acute cystitis but she had kidney infection and stones remotely.   Seen 2d later in follow up with ongoing stress > urge incontinence and significant vulvar redness and swelling. ?vaginal irritation of Lume OTC vaginal deodorant product. Treated with clobetasol, calamine and aveeno. She also used benadryl PRN itch. This has significantly improved.   Referred to [facility] urology for mixed incontinence (07/07/2022). Would be willing to go to [place] [employer]) if can be seen sooner. Also notes some leaking of urination. She regularly wears panty liner and has started using a pad.   Notes ongoing urinary frequency, urgency, dysuria, vaginal itch and burn, external itch as well, lower back discomfort. No suprapubic pain, fevers/chills, flank pain, hematuria. Rash symptoms are much improved with clobetasol.   ++ stress incontinence symptoms. Not full incontinence. Also with urge incontinence symptoms, + key in door, foot on floor. H/o difficult vaginal delivery (breach) with 1st child. Also h/o bladder sling and mesh placed (Dr Ivory Broad 1990s).   She's recently been  using vagisil anti itch wipes (vit E and aloe) with some benefit.   Having episodes of malaise and weakness that goes throughout body, associated with presyncope. No syncope, palpitations, pressure or dyspnea.      Relevant past medical, surgical, family and social history reviewed and updated as indicated. Interim medical history since our last visit reviewed. Allergies and medications reviewed and updated. Outpatient Medications Prior to Visit  Medication Sig Dispense Refill   apixaban (ELIQUIS) 5 MG TABS tablet TAKE 1 TABLET TWICE A DAY 180 tablet 2   atorvastatin (LIPITOR) 40 MG tablet TAKE 1 TABLET DAILY 90 tablet 3   Azelaic Acid (FINACEA) 15 % gel Apply 1 application. topically as directed. After skin is thoroughly washed and patted dry, gently but thoroughly massage a thin film of azelaic acid cream into the affected area twice daily, in the morning and evening. 150 g 4   Calcium Carbonate-Vitamin D (CALTRATE 600+D PO) Take 1 tablet by mouth daily.     chlorhexidine (PERIDEX) 0.12 % solution Use as directed 5 mLs in the mouth or throat daily as needed (dental procedure).  0   cholecalciferol (VITAMIN D3) 25 MCG (1000 UNIT) tablet Take 1,000 Units by mouth daily.     clobetasol ointment (TEMOVATE) 4.09 % Apply 1 Application topically 2 (two) times daily. 30 g 0   cycloSPORINE (RESTASIS) 0.05 % ophthalmic emulsion Apply 1 drop to eye 2 (two) times daily.     diphenoxylate-atropine (LOMOTIL) 2.5-0.025 MG tablet Take 1 tablet  by mouth 4 (four) times daily as needed for diarrhea or loose stools.     GLUCOSAMINE-CHONDROITIN PO Take 1 tablet by mouth 2 (two) times daily.     levothyroxine (SYNTHROID) 25 MCG tablet Take 1 tablet (25 mcg total) by mouth daily. 90 tablet 3   Methylcellulose, Laxative, (FIBER THERAPY PO) Take 2 capsules by mouth daily.     metoprolol succinate (TOPROL-XL) 25 MG 24 hr tablet Take 0.5 tablets (12.5 mg total) by mouth daily. 45 tablet 3   mometasone (ELOCON) 0.1 %  cream Apply to affected areas lower legs 1-2 times a day until improved. 45 g 0   MULTIPLE VITAMIN PO Take 1 tablet by mouth daily.     pantoprazole (PROTONIX) 40 MG tablet Take 1 tablet (40 mg total) by mouth daily. 90 tablet 3   rizatriptan (MAXALT) 10 MG tablet Take 10 mg by mouth as needed for migraine.     topiramate (TOPAMAX) 50 MG tablet Take 50 mg by mouth 2 (two) times daily.     vitamin B-12 (CYANOCOBALAMIN) 1000 MCG tablet Take 1,000 mcg by mouth daily.     No facility-administered medications prior to visit.     Per HPI unless specifically indicated in ROS section below Review of Systems  Objective:  BP 140/82   Pulse 67   Temp 98.1 F (36.7 C) (Temporal)   Ht 5\' 4"  (1.626 m)   Wt 160 lb 6 oz (72.7 kg)   SpO2 96%   BMI 27.53 kg/m   Wt Readings from Last 3 Encounters:  06/24/22 160 lb 6 oz (72.7 kg)  06/11/22 159 lb 8 oz (72.3 kg)  02/19/22 158 lb 4.8 oz (71.8 kg)      Physical Exam Vitals and nursing note reviewed.  Constitutional:      Appearance: Normal appearance. She is not ill-appearing.  HENT:     Mouth/Throat:     Mouth: Mucous membranes are moist.     Pharynx: Oropharynx is clear. No oropharyngeal exudate or posterior oropharyngeal erythema.  Eyes:     Extraocular Movements: Extraocular movements intact.     Pupils: Pupils are equal, round, and reactive to light.  Cardiovascular:     Rate and Rhythm: Normal rate and regular rhythm.     Pulses: Normal pulses.     Heart sounds: Normal heart sounds. No murmur heard. Pulmonary:     Effort: Pulmonary effort is normal. No respiratory distress.     Breath sounds: Normal breath sounds. No wheezing, rhonchi or rales.  Abdominal:     General: Bowel sounds are normal. There is no distension.     Palpations: Abdomen is soft. There is no mass.     Tenderness: There is no abdominal tenderness. There is no right CVA tenderness, left CVA tenderness, guarding or rebound. Negative signs include Murphy's sign.      Hernia: No hernia is present.  Musculoskeletal:     Right lower leg: No edema.     Left lower leg: No edema.  Skin:    General: Skin is warm and dry.     Findings: No rash.  Neurological:     Mental Status: She is alert.  Psychiatric:        Mood and Affect: Mood normal.        Behavior: Behavior normal.       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  Discussed kegel exercises for stress incontinence. She has had 2 previous bladder surgeries.  Discussed antimuscarinic for urge incontinence as well as possible anticholinergic side effects including constipation, dry mouth, urinary retention, memory difficulty and unsteadiness. Given symptoms present throughout the whole day but worse at night, will trial oxybutynin IR 5mg  BID.  Discussed limiting bladder irritants.  Discussed possible interstitial cystitis if ongoing cystitis symptoms with normal UA - trial NSAID.  Will await Uro eval.       Relevant Medications   oxybutynin (DITROPAN) 5 MG tablet   Other Relevant Orders   Ambulatory referral to Urology   Irritant contact dermatitis due to other chemical products    Vulvar contact dermatitis is improving on clobetasol. She will stop Lume products.       Other Visit Diagnoses     Dysuria       Relevant Orders   POCT Urinalysis Dipstick (Automated) (Completed)         Meds ordered this encounter  Medications   oxybutynin (DITROPAN) 5 MG tablet    Sig: Take 1 tablet (5 mg total) by mouth 2 (two) times daily as needed for bladder spasms.    Dispense:  40 tablet    Refill:  0   Orders Placed This Encounter  Procedures   Ambulatory referral to Urology    Referral Priority:   Routine    Referral Type:   Consultation    Referral Reason:   Specialty Services Required    Requested Specialty:   Urology    Number of Visits Requested:   1   POCT Urinalysis Dipstick (Automated)     Patient Instructions  Urine overall ok today. Will try to get you in to see urology sooner.  In interim, may continue clobetasol as it's helping and try oxybutynin 5mg  twice daily as needed for urinary symptoms.  May take ibuprofen 400mg  twice daily with meals as needed for bladder discomfort.  Do kegel exercises for stress incontinence.  Limit spicy foods, dark sodas, and caffeine.  Good to see you today.   Follow up plan: Return if symptoms worsen or fail to improve.  Ria Bush, MD

## 2022-06-25 ENCOUNTER — Encounter: Payer: Self-pay | Admitting: Family Medicine

## 2022-06-25 NOTE — Assessment & Plan Note (Addendum)
Pending urology evaluation for stress > urge incontinence.  Discussed kegel exercises for stress incontinence. She has had 2 previous bladder surgeries.  Discussed antimuscarinic for urge incontinence as well as possible anticholinergic side effects including constipation, dry mouth, urinary retention, memory difficulty and unsteadiness. Given symptoms present throughout the whole day but worse at night, will trial oxybutynin IR 5mg  BID.  Discussed limiting bladder irritants.  Discussed possible interstitial cystitis if ongoing cystitis symptoms with normal UA - trial NSAID.  Will await Uro eval.

## 2022-06-25 NOTE — Assessment & Plan Note (Signed)
Vulvar contact dermatitis is improving on clobetasol. She will stop Lume products.

## 2022-06-26 ENCOUNTER — Other Ambulatory Visit: Payer: Self-pay | Admitting: Family Medicine

## 2022-06-26 DIAGNOSIS — Z1231 Encounter for screening mammogram for malignant neoplasm of breast: Secondary | ICD-10-CM

## 2022-07-02 ENCOUNTER — Telehealth: Payer: Self-pay | Admitting: Cardiology

## 2022-07-02 NOTE — Telephone Encounter (Signed)
Spoke w/ pt.  She reports that Dr. Garen Lah wanted her to maintain her HR around 55-60, but it has been running in the high 60s for the past couple of weeks. She reports that she feels a bit breathless. She is currently taking metoprolol succinate 25 mg, 1/2 tablet per day. She would like to know if she should increase to a whole pill. Advised her that I will send message to Dr. Garen Lah and call her back w/ his recommendation.

## 2022-07-02 NOTE — Telephone Encounter (Signed)
Pt c/o medication issue:  1. Name of Medication:   metoprolol succinate (TOPROL-XL) 25 MG 24 hr tablet    2. How are you currently taking this medication (dosage and times per day)?  Take 0.5 tablets (12.5 mg total) by mouth daily. 3. Are you having a reaction (difficulty breathing--STAT)? No  4. What is your medication issue? Pt would like to know if provider would like for her to increase  dosage due to HR running between 65-70. Please advise

## 2022-07-03 MED ORDER — METOPROLOL SUCCINATE ER 25 MG PO TB24: 25.0000 mg | ORAL_TABLET | Freq: Every day | ORAL | 3 refills | Status: AC

## 2022-07-03 NOTE — Telephone Encounter (Signed)
Called patient and informed her of Dr. Dana Allan recommendation. Patient was grateful for the follow up.  Kate Sable, MD  You; Stana Bunting, RN 22 hours ago (3:11 PM)   Okay to increase Toprol-XL to 25 mg daily.

## 2022-07-07 ENCOUNTER — Encounter: Payer: Self-pay | Admitting: Urology

## 2022-07-07 ENCOUNTER — Ambulatory Visit (INDEPENDENT_AMBULATORY_CARE_PROVIDER_SITE_OTHER): Payer: Medicare Other | Admitting: Urology

## 2022-07-07 VITALS — BP 147/75 | HR 61 | Ht 64.0 in | Wt 158.0 lb

## 2022-07-07 DIAGNOSIS — N3946 Mixed incontinence: Secondary | ICD-10-CM | POA: Diagnosis not present

## 2022-07-07 LAB — URINALYSIS, COMPLETE
Bilirubin, UA: NEGATIVE
Glucose, UA: NEGATIVE
Ketones, UA: NEGATIVE
Leukocytes,UA: NEGATIVE
Nitrite, UA: NEGATIVE
Protein,UA: NEGATIVE
Specific Gravity, UA: 1.01 (ref 1.005–1.030)
Urobilinogen, Ur: 0.2 mg/dL (ref 0.2–1.0)
pH, UA: 5.5 (ref 5.0–7.5)

## 2022-07-07 LAB — MICROSCOPIC EXAMINATION: Bacteria, UA: NONE SEEN

## 2022-07-07 NOTE — Progress Notes (Signed)
07/07/2022 2:46 PM   Alexandria Moreno 1941-06-20 [id number]  Referring provider: Eugenia Pancoast, [place],  [place] [id number]  Chief Complaint  Patient presents with   New Patient (Initial Visit)   Urinary Incontinence    HPI: I was consulted to assess the patient's urinary incontinence.  She leaks with coughing sneezing and a hard laugh but not bending lifting.  She has urge incontinence and no bedwetting.  She wears 2 liners a day.  She had constipation from [facility] and she has been on Detrol in the past  For approximately 1 month she had a lot of burning and itchiness and frequency and she may have had an allergic reaction to a deodorant use locally in the vagina.  Things have normalized  She has had a previous sling and perhaps prolapse mesh surgery.  She has had a hysterectomy.  Recent urine culture in June 2023 negative  Bowel movements normal.  She has had a kidney stone.   PMH: Past Medical History:  Diagnosis Date   Basal cell carcinoma 05/22/2015   R nasal ala   Basal cell carcinoma 07/21/2017   L upper arm, L neck, L medial shoulder   Basal cell carcinoma 09/21/2018   R shoulder   Basal cell carcinoma 05/01/2020   L lower neck    Basal cell carcinoma 05/01/2020   L neck ant to scar    Basal cell carcinoma 11/05/2020   L nasal ala, refer for Radiation   Diastolic dysfunction    a. 09/2019 Echo: EF 60-65%, nl RV size/fxn. Nl Atrial sizes. Triv TR. Mild AI; b. 11/2020 Echo: EF 60-65%, no rwma, Gr2DD, nl RV size/fxn, RVSP 34.68mmHg. Mild MR/AI.   Diverticulosis    DJD (degenerative joint disease) of knee    GERD (gastroesophageal reflux disease)    H/o Iron deficiency anemia 2009   a. 05/2008 EGD/Colonoscopy: nl w/o evidence of bleeding; b. 11/2017 EGD: nl. Colonoscopy: 65mm cecal polyp, otw nl.   H/O transfusion of whole blood 2009   H/O: rheumatic fever    81 years old   History of chicken pox    History of stress test    a. 10/2019  EF 53%, no ischemia/infarct. No significant cor Ca2+. Mild thoracic desc Ao atherosclerosis.   Hx of basal cell carcinoma 2010   L dorsum tip of nose (MOHS), R ant alar groove (MOHS)   Hx of squamous cell carcinoma 11/01/2018   SCC is L upper forehead   Hyperlipidemia    Metatarsal fracture 09/2020   L 5th shaft s/p conservative management [facility])   Migraines    PAF (paroxysmal atrial fibrillation) ([place])    a. 08/2019 Zio: Avg HR 60 (min 43, max 144). PAF (2% burden), longest 1h 67m (avg rate 77). Rare PAC's and PVC's.   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   L upper forehead   Stress incontinence     Surgical History: Past Surgical History:  Procedure Laterality Date   [place] N/A 10/28/2021   Procedure: ATRIAL FIBRILLATION ABLATION;  Surgeon: Vickie Epley, MD;  Location: [facility];  Service: Cardiovascular;  Laterality: N/A;   capsule endoscopy  2009   WNL   CATARACT EXTRACTION Bilateral [id number]  COLONOSCOPY  2009   diverticulosis ([place])   COLONOSCOPY WITH PROPOFOL N/A 12/01/2017   TA, rpt 5 yrs ([place])   DEXA  06/2016   T -0.2 hip, 0.7 spine   ESOPHAGOGASTRODUODENOSCOPY  2009   focal mild chronic gastritis, neg H pylori ([place])   ESOPHAGOGASTRODUODENOSCOPY (EGD) WITH PROPOFOL N/A 12/01/2017   Procedure: ESOPHAGOGASTRODUODENOSCOPY (EGD) WITH PROPOFOL;  Surgeon: Lucilla Lame, MD;  Location: [facility];  Service: Endoscopy;  Laterality: N/A;   INCONTINENCE SURGERY  2001   with mesh   LIPOMA EXCISION  1971   right shoulder   MOHS SURGERY     [facility]   OVARIAN CYST REMOVAL  1960   SALPINGOOPHORECTOMY Left 2001   TONSILLECTOMY  1958    Home Medications:  Allergies as of 07/07/2022       Reactions   Aspirin Other (See Comments)   GI bleed   Baclofen Diarrhea    Codeine Nausea And Vomiting   Imitrex  [sumatriptan]    Palpatations   Influenza Vaccines Swelling   Vicodin  [hydrocodone-acetaminophen] Nausea And Vomiting        Medication List        Accurate as of July 07, 2022  2:46 PM. If you have any questions, ask your nurse or doctor.          atorvastatin 40 MG tablet Commonly known as: LIPITOR TAKE 1 TABLET DAILY   Azelaic Acid 15 % gel Commonly known as: Finacea Apply 1 application. topically as directed. After skin is thoroughly washed and patted dry, gently but thoroughly massage a thin film of azelaic acid cream into the affected area twice daily, in the morning and evening.   CALTRATE 600+D PO Take 1 tablet by mouth daily.   chlorhexidine 0.12 % solution Commonly known as: PERIDEX Use as directed 5 mLs in the mouth or throat daily as needed (dental procedure).   cholecalciferol 25 MCG (1000 UNIT) tablet Commonly known as: VITAMIN D3 Take 1,000 Units by mouth daily.   clobetasol ointment 0.05 % Commonly known as: TEMOVATE Apply 1 Application topically 2 (two) times daily.   cycloSPORINE 0.05 % ophthalmic emulsion Commonly known as: RESTASIS Apply 1 drop to eye 2 (two) times daily.   diphenoxylate-atropine 2.5-0.025 MG tablet Commonly known as: LOMOTIL Take 1 tablet by mouth 4 (four) times daily as needed for diarrhea or loose stools.   Eliquis 5 MG Tabs tablet Generic drug: apixaban TAKE 1 TABLET TWICE A DAY   FIBER THERAPY PO Take 2 capsules by mouth daily.   GLUCOSAMINE-CHONDROITIN PO Take 1 tablet by mouth 2 (two) times daily.   levothyroxine 25 MCG tablet Commonly known as: SYNTHROID Take 1 tablet (25 mcg total) by mouth daily.   metoprolol succinate 25 MG 24 hr tablet Commonly known as: TOPROL-XL Take 1 tablet (25 mg total) by mouth daily.   mometasone 0.1 % cream Commonly known as: ELOCON Apply to affected areas lower legs 1-2 times a day until improved.   MULTIPLE VITAMIN PO Take 1  tablet by mouth daily.   oxybutynin 5 MG tablet Commonly known as: DITROPAN Take 1 tablet (5 mg total) by mouth 2 (two) times daily as needed for bladder spasms.   pantoprazole 40 MG tablet Commonly known as: PROTONIX Take 1 tablet (40 mg total) by mouth daily.   rizatriptan 10 MG tablet Commonly known as: MAXALT Take 10 mg by mouth as needed for migraine.   topiramate 50 MG tablet Commonly known as: TOPAMAX Take 50 mg by  mouth 2 (two) times daily.   vitamin B-12 1000 MCG tablet Commonly known as: CYANOCOBALAMIN Take 1,000 mcg by mouth daily.        Allergies:  Allergies  Allergen Reactions   Aspirin Other (See Comments)    GI bleed   Baclofen Diarrhea   Codeine Nausea And Vomiting   Imitrex  [Sumatriptan]     Palpatations   Influenza Vaccines Swelling   Vicodin  [Hydrocodone-Acetaminophen] Nausea And Vomiting    Family History: Family History  Problem Relation Age of Onset   Stroke Mother 4   Cancer Mother        Cervical   Depression Mother    Dementia Mother        vascular   Heart disease Father        CABG   Alcohol abuse Father    Osteoporosis Father    Heart attack Father    Hypertension Brother    Heart attack Brother    Stroke Brother    Stroke Brother 63   Heart attack Brother    Stroke Paternal Grandfather    Alzheimer's disease Maternal Grandfather    Breast cancer Neg Hx     Social History:  reports that she quit smoking about 43 years ago. Her smoking use included cigarettes. She has a 10.00 pack-year smoking history. She has been exposed to tobacco smoke. She has never used smokeless tobacco. She reports that she does not drink alcohol and does not use drugs.  ROS:                                        Physical Exam: BP (!) 147/75   Pulse 61   Ht 5\' 4"  (1.626 m)   Wt 71.7 kg   BMI 27.12 kg/m   Constitutional:  Alert and oriented, No acute distress. HEENT: [place] AT, moist mucus membranes.  Trachea  midline, no masses. Cardiovascular: No clubbing, cyanosis, or edema. Respiratory: Normal respiratory effort, no increased work of breathing. GI: Abdomen is soft, nontender, nondistended, no abdominal masses GU: On pelvic examination she is unknown posterior defect grade 2.  She has significant shortening of the vagina approximately 6 or 7 cm.  Prolapse is primarily apical posterior.  With the prolapse reduced posteriorly little to no cystocele with a well supported bladder neck and no stress incontinence.  Moderate atrophy.  No mesh palpable Skin: No rashes, bruises or suspicious lesions. Lymph: No cervical or inguinal adenopathy. Neurologic: Grossly intact, no focal deficits, moving all 4 extremities. Psychiatric: Normal mood and affect.  Laboratory Data: Lab Results  Component Value Date   WBC 4.3 10/16/2021   HGB 14.8 10/16/2021   HCT 42.4 10/16/2021   MCV 92 10/16/2021   PLT 173 10/16/2021    Lab Results  Component Value Date   CREATININE 0.86 01/16/2022    No results found for: "PSA"  No results found for: "TESTOSTERONE"  No results found for: "HGBA1C"  Urinalysis    Component Value Date/Time   COLORURINE YELLOW (A) 11/11/2019 1027   APPEARANCEUR CLEAR (A) 11/11/2019 1027   LABSPEC 1.006 11/11/2019 1027   PHURINE 7.0 11/11/2019 1027   GLUCOSEU NEGATIVE 11/11/2019 1027   HGBUR LARGE (A) 11/11/2019 1027   BILIRUBINUR negative 06/24/2022 1421   KETONESUR NEGATIVE 11/11/2019 1027   PROTEINUR Negative 06/24/2022 1421   PROTEINUR NEGATIVE 11/11/2019 1027   UROBILINOGEN 0.2 06/24/2022 1421  NITRITE negative 06/24/2022 1421   NITRITE NEGATIVE 11/11/2019 1027   LEUKOCYTESUR Negative 06/24/2022 1421   LEUKOCYTESUR NEGATIVE 11/11/2019 1027    Pertinent Imaging: Urine reviewed.  Urine sent for culture.  Chart reviewed.  Assessment & Plan: Patient can live with her mild mixed incontinence.  It was the acute irritation was the initial reason for the referral.  She wants  to live with her mother incontinence and minimally symptomatic prolapse.  1. Mixed stress and urge urinary incontinence  - Urinalysis, Complete   No follow-ups on file.  Reece Packer, MD  [facility] [address] [place], [place] [address] [phone]

## 2022-07-12 ENCOUNTER — Other Ambulatory Visit: Payer: Self-pay | Admitting: Family Medicine

## 2022-07-14 NOTE — Telephone Encounter (Signed)
Last filled 06/24/22, #40.    Spoke with pt asking how she is taking med.  States recently saw urology and was told to d/c med.  Says she will contact [facility] to inform them she is not taking med and do not send anymore requests. Fyi to Dr. Darnell Level.   Request denied.

## 2022-07-14 NOTE — Telephone Encounter (Signed)
Noted. Thanks.

## 2022-07-28 ENCOUNTER — Ambulatory Visit
Admission: RE | Admit: 2022-07-28 | Discharge: 2022-07-28 | Disposition: A | Discharge status: Home / Self Care | Payer: Medicare Other | Source: Ambulatory Visit | Attending: Family Medicine | Admitting: Family Medicine

## 2022-07-28 DIAGNOSIS — Z1231 Encounter for screening mammogram for malignant neoplasm of breast: Secondary | ICD-10-CM | POA: Insufficient documentation

## 2022-07-31 ENCOUNTER — Other Ambulatory Visit: Payer: Self-pay | Admitting: Family Medicine

## 2022-07-31 DIAGNOSIS — I480 Paroxysmal atrial fibrillation: Secondary | ICD-10-CM

## 2022-07-31 DIAGNOSIS — E038 Other specified hypothyroidism: Secondary | ICD-10-CM

## 2022-07-31 DIAGNOSIS — D696 Thrombocytopenia, unspecified: Secondary | ICD-10-CM

## 2022-07-31 DIAGNOSIS — E785 Hyperlipidemia, unspecified: Secondary | ICD-10-CM

## 2022-08-01 ENCOUNTER — Other Ambulatory Visit (INDEPENDENT_AMBULATORY_CARE_PROVIDER_SITE_OTHER): Payer: Medicare Other

## 2022-08-01 DIAGNOSIS — I480 Paroxysmal atrial fibrillation: Secondary | ICD-10-CM | POA: Diagnosis not present

## 2022-08-01 DIAGNOSIS — E785 Hyperlipidemia, unspecified: Secondary | ICD-10-CM

## 2022-08-01 DIAGNOSIS — E038 Other specified hypothyroidism: Secondary | ICD-10-CM

## 2022-08-01 DIAGNOSIS — D696 Thrombocytopenia, unspecified: Secondary | ICD-10-CM

## 2022-08-01 LAB — CBC WITH DIFFERENTIAL/PLATELET
Basophils Absolute: 0 10*3/uL (ref 0.0–0.1)
Basophils Relative: 1 % (ref 0.0–3.0)
Eosinophils Absolute: 0.1 10*3/uL (ref 0.0–0.7)
Eosinophils Relative: 3.4 % (ref 0.0–5.0)
HCT: 43.9 % (ref 36.0–46.0)
Hemoglobin: 14.4 g/dL (ref 12.0–15.0)
Lymphocytes Relative: 31.6 % (ref 12.0–46.0)
Lymphs Abs: 1.3 10*3/uL (ref 0.7–4.0)
MCHC: 32.8 g/dL (ref 30.0–36.0)
MCV: 94 fl (ref 78.0–100.0)
Monocytes Absolute: 0.2 10*3/uL (ref 0.1–1.0)
Monocytes Relative: 5.6 % (ref 3.0–12.0)
Neutro Abs: 2.5 10*3/uL (ref 1.4–7.7)
Neutrophils Relative %: 58.4 % (ref 43.0–77.0)
Platelets: 142 10*3/uL — ABNORMAL LOW (ref 150.0–400.0)
RBC: 4.67 Mil/uL (ref 3.87–5.11)
RDW: 13.5 % (ref 11.5–15.5)
WBC: 4.2 10*3/uL (ref 4.0–10.5)

## 2022-08-01 LAB — LIPID PANEL
Cholesterol: 147 mg/dL (ref 0–200)
HDL: 40.7 mg/dL (ref >39.00)
LDL Cholesterol: 71 mg/dL (ref 0–99)
NonHDL: 106.62
Total CHOL/HDL Ratio: 4
Triglycerides: 180 mg/dL — ABNORMAL HIGH (ref 0.0–149.0)
VLDL: 36 mg/dL (ref 0.0–40.0)

## 2022-08-01 LAB — COMPREHENSIVE METABOLIC PANEL
ALT: 17 U/L (ref 0–35)
AST: 21 U/L (ref 0–37)
Albumin: 4.1 g/dL (ref 3.5–5.2)
Alkaline Phosphatase: 87 U/L (ref 39–117)
BUN: 14 mg/dL (ref 6–23)
CO2: 24 mEq/L (ref 19–32)
Calcium: 9.1 mg/dL (ref 8.4–10.5)
Chloride: 110 mEq/L (ref 96–112)
Creatinine, Ser: 0.89 mg/dL (ref 0.40–1.20)
GFR: 60.88 mL/min (ref >60.00)
Glucose, Bld: 90 mg/dL (ref 70–99)
Potassium: 4.1 mEq/L (ref 3.5–5.1)
Sodium: 143 mEq/L (ref 135–145)
Total Bilirubin: 0.6 mg/dL (ref 0.2–1.2)
Total Protein: 6.2 g/dL (ref 6.0–8.3)

## 2022-08-01 LAB — TSH: TSH: 4.69 u[IU]/mL (ref 0.35–5.50)

## 2022-08-01 LAB — T4, FREE: Free T4: 0.82 ng/dL (ref 0.60–1.60)

## 2022-08-04 DIAGNOSIS — H40003 Preglaucoma, unspecified, bilateral: Secondary | ICD-10-CM | POA: Diagnosis not present

## 2022-08-08 ENCOUNTER — Encounter: Payer: Self-pay | Admitting: Family Medicine

## 2022-08-08 ENCOUNTER — Ambulatory Visit (INDEPENDENT_AMBULATORY_CARE_PROVIDER_SITE_OTHER): Payer: Medicare Other

## 2022-08-08 ENCOUNTER — Ambulatory Visit (INDEPENDENT_AMBULATORY_CARE_PROVIDER_SITE_OTHER): Payer: Medicare Other | Admitting: Family Medicine

## 2022-08-08 VITALS — BP 130/70 | HR 68 | Temp 98.2°F | Ht 62.5 in | Wt 160.1 lb

## 2022-08-08 VITALS — Ht 64.0 in | Wt 158.0 lb

## 2022-08-08 DIAGNOSIS — Z780 Asymptomatic menopausal state: Secondary | ICD-10-CM

## 2022-08-08 DIAGNOSIS — E038 Other specified hypothyroidism: Secondary | ICD-10-CM

## 2022-08-08 DIAGNOSIS — N3946 Mixed incontinence: Secondary | ICD-10-CM | POA: Diagnosis not present

## 2022-08-08 DIAGNOSIS — D751 Secondary polycythemia: Secondary | ICD-10-CM | POA: Diagnosis not present

## 2022-08-08 DIAGNOSIS — E785 Hyperlipidemia, unspecified: Secondary | ICD-10-CM | POA: Diagnosis not present

## 2022-08-08 DIAGNOSIS — K219 Gastro-esophageal reflux disease without esophagitis: Secondary | ICD-10-CM

## 2022-08-08 DIAGNOSIS — I480 Paroxysmal atrial fibrillation: Secondary | ICD-10-CM | POA: Diagnosis not present

## 2022-08-08 DIAGNOSIS — D696 Thrombocytopenia, unspecified: Secondary | ICD-10-CM | POA: Diagnosis not present

## 2022-08-08 DIAGNOSIS — Z0000 Encounter for general adult medical examination without abnormal findings: Secondary | ICD-10-CM | POA: Diagnosis not present

## 2022-08-08 DIAGNOSIS — Z7189 Other specified counseling: Secondary | ICD-10-CM | POA: Diagnosis not present

## 2022-08-08 NOTE — Progress Notes (Signed)
Virtual Visit via Telephone Note  I connected with  Alexandria Moreno on 08/08/22 at 10:15 AM EDT by telephone and verified that I am speaking with the correct person using two identifiers.  Location: Patient: home Provider: [place] Persons participating in the virtual visit: [place]   I discussed the limitations, risks, security and privacy concerns of performing an evaluation and management service by telephone and the availability of in person appointments. The patient expressed understanding and agreed to proceed.  Interactive audio and video telecommunications were attempted between this nurse and patient, however failed, due to patient having technical difficulties OR patient did not have access to video capability.  We continued and completed visit with audio only.  Some vital signs may be absent or patient reported.   Dionisio David, LPN  Subjective:   Alexandria Moreno is a 81 y.o. female who presents for Medicare Annual (Subsequent) preventive examination.  Review of Systems     Cardiac Risk Factors include: advanced age (>45men, >55 women);dyslipidemia     Objective:    There were no vitals filed for this visit. There is no height or weight on file to calculate BMI.     08/08/2022   10:24 AM 02/19/2022    1:06 PM 12/12/2021   10:28 AM 10/28/2021    8:05 AM 06/13/2021    9:32 AM 02/11/2021    9:23 AM 11/19/2020    2:02 PM  Advanced Directives  Does Patient Have a Medical Advance Directive? Yes Yes No Yes Yes Yes Yes  Type of Advance Directive Living will Living will;Healthcare Power of [place];Living will [place];Living will Living will;Healthcare Power of [place];Living will  Does patient want to make changes to medical advance directive? Yes (Inpatient - patient defers changing a medical advance directive and declines information at this time)   No - Patient  declined     Copy of [place] in Chart?  No - copy requested    No - copy requested No - copy requested  Would patient like information on creating a medical advance directive?  No - Patient declined No - Patient declined   No - Patient declined No - Patient declined    Current Medications (verified) Outpatient Encounter Medications as of 08/08/2022  Medication Sig   apixaban (ELIQUIS) 5 MG TABS tablet TAKE 1 TABLET TWICE A DAY   atorvastatin (LIPITOR) 40 MG tablet TAKE 1 TABLET DAILY   Calcium Carbonate-Vitamin D (CALTRATE 600+D PO) Take 1 tablet by mouth daily.   chlorhexidine (PERIDEX) 0.12 % solution Use as directed 5 mLs in the mouth or throat daily as needed (dental procedure).   cholecalciferol (VITAMIN D3) 25 MCG (1000 UNIT) tablet Take 1,000 Units by mouth daily.   clobetasol ointment (TEMOVATE) 3.66 % Apply 1 Application topically 2 (two) times daily.   cycloSPORINE (RESTASIS) 0.05 % ophthalmic emulsion Apply 1 drop to eye 2 (two) times daily.   diphenoxylate-atropine (LOMOTIL) 2.5-0.025 MG tablet Take 1 tablet by mouth 4 (four) times daily as needed for diarrhea or loose stools.   GLUCOSAMINE-CHONDROITIN PO Take 1 tablet by mouth 2 (two) times daily.   levothyroxine (SYNTHROID) 25 MCG tablet Take 1 tablet (25 mcg total) by mouth daily.   Methylcellulose, Laxative, (FIBER THERAPY PO) Take 2 capsules by mouth daily.   metoprolol succinate (TOPROL-XL) 25 MG 24 hr tablet Take 1 tablet (25 mg total) by mouth daily.  mometasone (ELOCON) 0.1 % cream Apply to affected areas lower legs 1-2 times a day until improved.   MULTIPLE VITAMIN PO Take 1 tablet by mouth daily.   pantoprazole (PROTONIX) 40 MG tablet Take 1 tablet (40 mg total) by mouth daily.   rizatriptan (MAXALT) 10 MG tablet Take 10 mg by mouth as needed for migraine.   topiramate (TOPAMAX) 50 MG tablet Take 50 mg by mouth 2 (two) times daily.   vitamin B-12 (CYANOCOBALAMIN) 1000 MCG tablet Take 1,000 mcg by  mouth daily.   Azelaic Acid (FINACEA) 15 % gel Apply 1 application. topically as directed. After skin is thoroughly washed and patted dry, gently but thoroughly massage a thin film of azelaic acid cream into the affected area twice daily, in the morning and evening. (Patient not taking: Reported on 08/08/2022)   [DISCONTINUED] oxybutynin (DITROPAN) 5 MG tablet Take 1 tablet (5 mg total) by mouth 2 (two) times daily as needed for bladder spasms.   No facility-administered encounter medications on file as of 08/08/2022.    Allergies (verified) Aspirin, Baclofen, Codeine, Imitrex  [sumatriptan], Influenza vaccines, and Vicodin  [hydrocodone-acetaminophen]   History: Past Medical History:  Diagnosis Date   Basal cell carcinoma 05/22/2015   R nasal ala   Basal cell carcinoma 07/21/2017   L upper arm, L neck, L medial shoulder   Basal cell carcinoma 09/21/2018   R shoulder   Basal cell carcinoma 05/01/2020   L lower neck    Basal cell carcinoma 05/01/2020   L neck ant to scar    Basal cell carcinoma 11/05/2020   L nasal ala, refer for Radiation   Diastolic dysfunction    a. 09/2019 Echo: EF 60-65%, nl RV size/fxn. Nl Atrial sizes. Triv TR. Mild AI; b. 11/2020 Echo: EF 60-65%, no rwma, Gr2DD, nl RV size/fxn, RVSP 34.32mmHg. Mild MR/AI.   Diverticulosis    DJD (degenerative joint disease) of knee    GERD (gastroesophageal reflux disease)    H/o Iron deficiency anemia 2009   a. 05/2008 EGD/Colonoscopy: nl w/o evidence of bleeding; b. 11/2017 EGD: nl. Colonoscopy: 30mm cecal polyp, otw nl.   H/O transfusion of whole blood 2009   H/O: rheumatic fever    81 years old   History of chicken pox    History of stress test    a. 10/2019 EF 53%, no ischemia/infarct. No significant cor Ca2+. Mild thoracic desc Ao atherosclerosis.   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 08/2019 [facility]: Avg HR 60 (min 43, max 144). PAF (2% burden), longest 1h 77m (avg rate 77). Rare PAC's and PVC's.   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   L upper forehead   Stress incontinence    Past Surgical History:  Procedure Laterality Date   [place] N/A 10/28/2021   Procedure: ATRIAL FIBRILLATION ABLATION;  Surgeon: Vickie Epley, MD;  Location: [facility];  Service: Cardiovascular;  Laterality:  N/A;   capsule endoscopy  2009   WNL   CATARACT EXTRACTION Bilateral [id number]   COLONOSCOPY  2009   diverticulosis ([place])   COLONOSCOPY WITH PROPOFOL N/A 12/01/2017   TA, rpt 5 yrs ([place])   DEXA  06/2016   T -0.2 hip, 0.7 spine   ESOPHAGOGASTRODUODENOSCOPY  2009   focal mild chronic gastritis, neg H pylori ([place])   ESOPHAGOGASTRODUODENOSCOPY (EGD) WITH PROPOFOL N/A 12/01/2017   Procedure: ESOPHAGOGASTRODUODENOSCOPY (EGD) WITH PROPOFOL;  Surgeon: Lucilla Lame, MD;  Location: [facility];  Service: Endoscopy;  Laterality: N/A;   INCONTINENCE SURGERY  2001   with mesh   [place]   right shoulder   MOHS SURGERY     [facility]   OVARIAN CYST REMOVAL  1960   SALPINGOOPHORECTOMY Left 2001   TONSILLECTOMY  1958   Family History  Problem Relation Age of Onset   Stroke Mother 32   Cancer Mother        Cervical   Depression Mother    Dementia Mother        vascular   Heart disease Father        CABG   Alcohol abuse Father    Osteoporosis Father    Heart attack Father    Hypertension Brother    Heart attack Brother    Stroke Brother    Stroke Brother 63   Heart attack Brother    Stroke Paternal Grandfather     Alzheimer's disease Maternal Grandfather    Breast cancer Neg Hx    Social History   Socioeconomic History   Marital status: Married    Spouse name: Not on file   Number of children: Not on file   Years of education: Not on file   Highest education level: Not on file  Occupational History   Not on file  Tobacco Use   Smoking status: Former    Packs/day: 0.50    Years: 20.00    Total pack years: 10.00    Types: Cigarettes    Quit date: 12/15/1978    Years since quitting: 43.6    Passive exposure: Past   Smokeless tobacco: Never  Vaping Use   Vaping Use: Never used  Substance and Sexual Activity   Alcohol use: No    Alcohol/week: 0.0 standard drinks of alcohol   Drug use: No   Sexual activity: Never  Other Topics Concern   Not on file  Social History Narrative   Lives with husband    Occupation: retired, was Art therapist   Edu: some college   Activity: stationary bicycle   Diet: good water, following slim fast diet, good vegetables   Social Determinants of Health   Financial Resource Strain: Low Risk  (08/08/2022)   Overall Financial Resource Strain (CARDIA)    Difficulty of Paying Living Expenses: Not hard at all  Food Insecurity: No Food Insecurity (08/08/2022)   Hunger Vital Sign    Worried About Running Out of Food in the Last Year: Never true    Ran Out of Food in the Last Year: Never true  Transportation Needs: No Transportation Needs (08/08/2022)   PRAPARE - Hydrologist (Medical): No    Lack of Transportation (Non-Medical): No  Physical Activity: Insufficiently Active (08/08/2022)   Exercise Vital Sign    Days of Exercise per Week: 3 days    Minutes of Exercise per Session: 30 min  Stress: No Stress Concern Present (08/08/2022)   [employer]  [employer] Occupational [employer] - Occupational Stress Questionnaire    Feeling of Stress : Only a little  Social Connections: Moderately Isolated (08/08/2022)   Social Connection and Isolation  Panel [NHANES]    Frequency of Communication with Friends and Family: Three times a week    Frequency of Social Gatherings with Friends and Family: Never    Attends Religious Services: Never    Marine scientist or Organizations: No    Attends Music therapist: Never    Marital Status: Married    Tobacco Counseling Counseling given: Not Answered   Clinical Intake:  Pre-visit preparation completed: Yes  Pain : No/denies pain     Nutritional Risks: None Diabetes: No  How often do you need to have someone help you when you read instructions, pamphlets, or other written materials from your doctor or pharmacy?: 1 - Never  Diabetic?no  Interpreter Needed?: No  Information entered by :: Kirke Shaggy, LPN   Activities of Daily Living    08/08/2022   10:26 AM 10/28/2021    8:04 AM  In your present state of health, do you have any difficulty performing the following activities:  Hearing? 0   Vision? 0   Difficulty concentrating or making decisions? 0   Walking or climbing stairs? 0 0  Dressing or bathing? 0   Doing errands, shopping? 0   Preparing Food and eating ? N   Using the Toilet? N   In the past six months, have you accidently leaked urine? N   Do you have problems with loss of bowel control? N   Managing your Medications? N   Managing your Finances? N   Housekeeping or managing your Housekeeping? N     Patient Care Team: Ria Bush, MD as PCP - General (Family Medicine) Kate Sable, MD as PCP - Cardiology (Cardiology) Vickie Epley, MD as PCP - Electrophysiology (Cardiology) Lucilla Lame, MD as Consulting Physician (Gastroenterology) Brendolyn Patty, MD as Consulting Physician (Dermatology) Vladimir Crofts, MD as Consulting Physician (Neurology) Roque Cash., MD as Consulting Physician (Obstetrics and Gynecology) Alison Stalling, OD (Optometry)  Indicate any recent Medical Services you may have received from other than  [facility] providers in the past year (date may be approximate).     Assessment:   This is a routine wellness examination for [place].  Hearing/Vision screen Hearing Screening - Comments:: No aids Vision Screening - Comments:: Wears glasses- [facility]  Dietary issues and exercise activities discussed: Current Exercise Habits: Home exercise routine, Type of exercise: walking, Time (Minutes): 30, Frequency (Times/Week): 3, Weekly Exercise (Minutes/Week): 90, Intensity: Mild   Goals Addressed             This Visit's Progress    DIET - EAT MORE FRUITS AND VEGETABLES         Depression Screen    08/08/2022   10:22 AM 08/07/2021    2:08 PM 07/27/2020    9:11 AM 07/25/2019    9:28 AM 07/07/2018    3:45 PM 07/01/2017    9:28 AM 05/22/2016    1:27 PM  PHQ 2/9 Scores  PHQ - 2 Score 0 0 0 0 0 0 0  PHQ- 9 Score 0  0  0      Fall Risk    08/08/2022   10:25 AM 08/07/2021    2:09 PM 07/27/2020    9:11 AM 07/25/2019    9:28 AM 07/07/2018    3:45 PM  [place]  in the past year? 1 0 0 1 No  Number falls in past yr: 0 0 0 0   Injury with Fall? 0 0 0 1   Risk for fall due to : History of fall(s)  Medication side effect    Follow up Falls evaluation completed;Falls prevention discussed  Falls evaluation completed;Falls prevention discussed      FALL RISK PREVENTION PERTAINING TO THE HOME:  Any stairs in or around the home? No  If so, are there any without handrails? No  Home free of loose throw rugs in walkways, pet beds, electrical cords, etc? Yes  Adequate lighting in your home to reduce risk of falls? Yes   ASSISTIVE DEVICES UTILIZED TO PREVENT FALLS:  Life alert? No  Use of a cane, walker or w/c? No  Grab bars in the bathroom? Yes  Shower chair or bench in shower? Yes  Elevated toilet seat or a handicapped toilet? No    Cognitive Function:    07/27/2020    9:16 AM 07/07/2018    9:48 AM 07/01/2017    9:30 AM 05/22/2016    1:35 PM  MMSE - Mini Mental State Exam   Orientation to time 5 5 5 5   Orientation to Place 5 5 5 5   Registration 3 3 3 3   Attention/ Calculation 5 0 0 0  Recall 3 3 3 3   Language- name 2 objects  0 0 0  Language- repeat 1 1 1 1   Language- follow 3 step command  3 3 3   Language- read & follow direction  0 0 0  Write a sentence  0 0 0  Copy design  0 0 0  Total score  20 20 20         08/08/2022   10:26 AM  6CIT Screen  What Year? 0 points  What month? 0 points  What time? 0 points  Count back from 20 0 points  Months in reverse 0 points  Repeat phrase 0 points  Total Score 0 points    Immunizations Immunization History  Administered Date(s) Administered   Influenza-Unspecified 10/02/2014   PFIZER(Purple Top)SARS-COV-2 Vaccination 02/09/2020, 03/07/2020, 10/22/2020   Pneumococcal Conjugate-13 10/02/2014   Pneumococcal Polysaccharide-23 12/16/2007   Td 05/15/2008   Zoster, Live 11/13/2013    TDAP status: Due, Education has been provided regarding the importance of this vaccine. Advised may receive this vaccine at local pharmacy or Health Dept. Aware to provide a copy of the vaccination record if obtained from local pharmacy or Health Dept. Verbalized acceptance and understanding.  Flu Vaccine status: Declined, Education has been provided regarding the importance of this vaccine but patient still declined. Advised may receive this vaccine at local pharmacy or Health Dept. Aware to provide a copy of the vaccination record if obtained from local pharmacy or Health Dept. Verbalized acceptance and understanding.  Pneumococcal vaccine status: Up to date  Covid-19 vaccine status: Completed vaccines  Qualifies for Shingles Vaccine? Yes   Zostavax completed Yes   Shingrix Completed?: No.    Education has been provided regarding the importance of this vaccine. Patient has been advised to call insurance company to determine out of pocket expense if they have not yet received this vaccine. Advised may also receive vaccine  at local pharmacy or Health Dept. Verbalized acceptance and understanding.  Screening Tests Health Maintenance  Topic Date Due   Zoster Vaccines- Shingrix (1 of 2) Never done   COVID-19 Vaccine (4 - Pfizer risk series) 12/17/2020   TETANUS/TDAP  07/27/2024 (Originally 05/15/2018)   COLONOSCOPY (Pts 45-91yrs Insurance coverage will need to be confirmed)  12/01/2022   MAMMOGRAM  07/29/2023   Pneumonia Vaccine 10+ Years old  Completed   DEXA SCAN  Completed   HPV VACCINES  Aged Out    Health Maintenance  Health Maintenance Due  Topic Date Due   Zoster Vaccines- Shingrix (1 of 2) Never done   COVID-19 Vaccine (4 - Pfizer risk series) 12/17/2020    Colorectal cancer screening: Type of screening: Colonoscopy. Completed 12/01/17. Repeat every 5 years- aged out  Mammogram status: Completed 07/28/22. Repeat every year  Bone Density status: Completed 06/19/16. Results reflect: Bone density results: NORMAL. Repeat every 5 years.  Lung Cancer Screening: (Low Dose CT Chest recommended if Age 22-80 years, 30 pack-year currently smoking OR have quit w/in 15years.) does not qualify.   Additional Screening:  Hepatitis C Screening: does not qualify; Completed no  Vision Screening: Recommended annual ophthalmology exams for early detection of glaucoma and other disorders of the eye. Is the patient up to date with their annual eye exam?  Yes  Who is the provider or what is the name of the office in which the patient attends annual eye exams? [facility] If pt is not established with a provider, would they like to be referred to a provider to establish care? No .   Dental Screening: Recommended annual dental exams for proper oral hygiene  Community Resource Referral / Chronic Care Management: CRR required this visit?  No   CCM required this visit?  No      Plan:     I have personally reviewed and noted the following in the patient's chart:   Medical and social history Use of alcohol,  tobacco or illicit drugs  Current medications and supplements including opioid prescriptions. Patient is not currently taking opioid prescriptions. Functional ability and status Nutritional status Physical activity Advanced directives List of other physicians Hospitalizations, surgeries, and ER visits in previous 12 months Vitals Screenings to include cognitive, depression, and falls Referrals and appointments  In addition, I have reviewed and discussed with patient certain preventive protocols, quality metrics, and best practice recommendations. A written personalized care plan for preventive services as well as general preventive health recommendations were provided to patient.     Dionisio David, LPN   06/16/5008   Nurse Notes: none

## 2022-08-08 NOTE — Patient Instructions (Signed)
Alexandria Moreno , Thank you for taking time to come for your Medicare Wellness Visit. I appreciate your ongoing commitment to your health goals. Please review the following plan we discussed and let me know if I can assist you in the future.   Screening recommendations/referrals: Colonoscopy: 12/01/17 Mammogram: 07/28/22 Bone Density: 06/19/16, referral sent Recommended yearly ophthalmology/optometry visit for glaucoma screening and checkup Recommended yearly dental visit for hygiene and checkup  Vaccinations: Influenza vaccine: n/d Pneumococcal vaccine: 10/02/14 Tdap vaccine: 05/15/08, due if have injury Shingles vaccine: Zostavax 11/13/13   Covid-19:02/09/20, 03/07/20, 10/22/20  Advanced directives: no  Conditions/risks identified: none  Next appointment: Follow up in one year for your annual wellness visit 08/11/23 @ 10:30 am by phone   Preventive Care 65 Years and Older, Female Preventive care refers to lifestyle choices and visits with your health care provider that can promote health and wellness. What does preventive care include? A yearly physical exam. This is also called an annual well check. Dental exams once or twice a year. Routine eye exams. Ask your health care provider how often you should have your eyes checked. Personal lifestyle choices, including: Daily care of your teeth and gums. Regular physical activity. Eating a healthy diet. Avoiding tobacco and drug use. Limiting alcohol use. Practicing safe sex. Taking low-dose aspirin every day. Taking vitamin and mineral supplements as recommended by your health care provider. What happens during an annual well check? The services and screenings done by your health care provider during your annual well check will depend on your age, overall health, lifestyle risk factors, and family history of disease. Counseling  Your health care provider may ask you questions about your: Alcohol use. Tobacco use. Drug use. Emotional  well-being. Home and relationship well-being. Sexual activity. Eating habits. History of falls. Memory and ability to understand (cognition). Work and work Statistician. Reproductive health. Screening  You may have the following tests or measurements: Height, weight, and BMI. Blood pressure. Lipid and cholesterol levels. These may be checked every 5 years, or more frequently if you are over 71 years old. Skin check. Lung cancer screening. You may have this screening every year starting at age 37 if you have a 30-pack-year history of smoking and currently smoke or have quit within the past 15 years. Fecal occult blood test (FOBT) of the stool. You may have this test every year starting at age 33. Flexible sigmoidoscopy or colonoscopy. You may have a sigmoidoscopy every 5 years or a colonoscopy every 10 years starting at age 70. Hepatitis C blood test. Hepatitis B blood test. Sexually transmitted disease (STD) testing. Diabetes screening. This is done by checking your blood sugar (glucose) after you have not eaten for a while (fasting). You may have this done every 1-3 years. Bone density scan. This is done to screen for osteoporosis. You may have this done starting at age 82. Mammogram. This may be done every 1-2 years. Talk to your health care provider about how often you should have regular mammograms. Talk with your health care provider about your test results, treatment options, and if necessary, the need for more tests. Vaccines  Your health care provider may recommend certain vaccines, such as: Influenza vaccine. This is recommended every year. Tetanus, diphtheria, and acellular pertussis (Tdap, Td) vaccine. You may need a Td booster every 10 years. Zoster vaccine. You may need this after age 27. Pneumococcal 13-valent conjugate (PCV13) vaccine. One dose is recommended after age 61. Pneumococcal polysaccharide (PPSV23) vaccine. One dose is recommended after  age 2. Talk to your  health care provider about which screenings and vaccines you need and how often you need them. This information is not intended to replace advice given to you by your health care provider. Make sure you discuss any questions you have with your health care provider. Document Released: 12/28/2015 Document Revised: 08/20/2016 Document Reviewed: 10/02/2015 Elsevier Interactive Patient Education  2017 [place] Prevention in the Home Falls can cause injuries. They can happen to people of all ages. There are many things you can do to make your home safe and to help prevent falls. What can I do on the outside of my home? Regularly fix the edges of walkways and driveways and fix any cracks. Remove anything that might make you trip as you walk through a door, such as a raised step or threshold. Trim any bushes or trees on the path to your home. Use bright outdoor lighting. Clear any walking paths of anything that might make someone trip, such as rocks or tools. Regularly check to see if handrails are loose or broken. Make sure that both sides of any steps have handrails. Any raised decks and porches should have guardrails on the edges. Have any leaves, snow, or ice cleared regularly. Use sand or salt on walking paths during winter. Clean up any spills in your garage right away. This includes oil or grease spills. What can I do in the bathroom? Use night lights. Install grab bars by the toilet and in the tub and shower. Do not use towel bars as grab bars. Use non-skid mats or decals in the tub or shower. If you need to sit down in the shower, use a plastic, non-slip stool. Keep the floor dry. Clean up any water that spills on the floor as soon as it happens. Remove soap buildup in the tub or shower regularly. Attach bath mats securely with double-sided non-slip rug tape. Do not have throw rugs and other things on the floor that can make you trip. What can I do in the bedroom? Use night  lights. Make sure that you have a light by your bed that is easy to reach. Do not use any sheets or blankets that are too big for your bed. They should not hang down onto the floor. Have a firm chair that has side arms. You can use this for support while you get dressed. Do not have throw rugs and other things on the floor that can make you trip. What can I do in the kitchen? Clean up any spills right away. Avoid walking on wet floors. Keep items that you use a lot in easy-to-reach places. If you need to reach something above you, use a strong step stool that has a grab bar. Keep electrical cords out of the way. Do not use floor polish or wax that makes floors slippery. If you must use wax, use non-skid floor wax. Do not have throw rugs and other things on the floor that can make you trip. What can I do with my stairs? Do not leave any items on the stairs. Make sure that there are handrails on both sides of the stairs and use them. Fix handrails that are broken or loose. Make sure that handrails are as long as the stairways. Check any carpeting to make sure that it is firmly attached to the stairs. Fix any carpet that is loose or worn. Avoid having throw rugs at the top or bottom of the stairs. If you do  have throw rugs, attach them to the floor with carpet tape. Make sure that you have a light switch at the top of the stairs and the bottom of the stairs. If you do not have them, ask someone to add them for you. What else can I do to help prevent falls? Wear shoes that: Do not have high heels. Have rubber bottoms. Are comfortable and fit you well. Are closed at the toe. Do not wear sandals. If you use a stepladder: Make sure that it is fully opened. Do not climb a closed stepladder. Make sure that both sides of the stepladder are locked into place. Ask someone to hold it for you, if possible. Clearly mark and make sure that you can see: Any grab bars or handrails. First and last  steps. Where the edge of each step is. Use tools that help you move around (mobility aids) if they are needed. These include: Canes. Walkers. Scooters. Crutches. Turn on the lights when you go into a dark area. Replace any light bulbs as soon as they burn out. Set up your furniture so you have a clear path. Avoid moving your furniture around. If any of your floors are uneven, fix them. If there are any pets around you, be aware of where they are. Review your medicines with your doctor. Some medicines can make you feel dizzy. This can increase your chance of falling. Ask your doctor what other things that you can do to help prevent falls. This information is not intended to replace advice given to you by your health care provider. Make sure you discuss any questions you have with your health care provider. Document Released: 09/27/2009 Document Revised: 05/08/2016 Document Reviewed: 01/05/2015 Elsevier Interactive Patient Education  2017 [employer].

## 2022-08-08 NOTE — Assessment & Plan Note (Signed)
Stable period on pantoprazole 40mg  daily

## 2022-08-08 NOTE — Assessment & Plan Note (Addendum)
Chronic, stable on atorvastatin 40mg  daily - continue. The ASCVD Risk score (Arnett DK, et al., 2019) failed to calculate for the following reasons:   The 2019 ASCVD risk score is only valid for ages 10 to 29

## 2022-08-08 NOTE — Patient Instructions (Addendum)
If interested, check with pharmacy about new 2 shot shingles series (shingrix).  Work on exercise routine - ie walking regularly.  Check on updating advanced directives from 2017 if needed.  You are doing well today Return as needed or in 1 year for next physical.   Health Maintenance After Age 81 After age 3, you are at a higher risk for certain long-term diseases and infections as well as injuries from falls. Falls are a major cause of broken bones and head injuries in people who are older than age 40. Getting regular preventive care can help to keep you healthy and well. Preventive care includes getting regular testing and making lifestyle changes as recommended by your health care provider. Talk with your health care provider about: Which screenings and tests you should have. A screening is a test that checks for a disease when you have no symptoms. A diet and exercise plan that is right for you. What should I know about screenings and tests to prevent falls? Screening and testing are the best ways to find a health problem early. Early diagnosis and treatment give you the best chance of managing medical conditions that are common after age 86. Certain conditions and lifestyle choices may make you more likely to have a fall. Your health care provider may recommend: Regular vision checks. Poor vision and conditions such as cataracts can make you more likely to have a fall. If you wear glasses, make sure to get your prescription updated if your vision changes. Medicine review. Work with your health care provider to regularly review all of the medicines you are taking, including over-the-counter medicines. Ask your health care provider about any side effects that may make you more likely to have a fall. Tell your health care provider if any medicines that you take make you feel dizzy or sleepy. Strength and balance checks. Your health care provider may recommend certain tests to check your strength  and balance while standing, walking, or changing positions. Foot health exam. Foot pain and numbness, as well as not wearing proper footwear, can make you more likely to have a fall. Screenings, including: Osteoporosis screening. Osteoporosis is a condition that causes the bones to get weaker and break more easily. Blood pressure screening. Blood pressure changes and medicines to control blood pressure can make you feel dizzy. Depression screening. You may be more likely to have a fall if you have a fear of falling, feel depressed, or feel unable to do activities that you used to do. Alcohol use screening. Using too much alcohol can affect your balance and may make you more likely to have a fall. Follow these instructions at home: Lifestyle Do not drink alcohol if: Your health care provider tells you not to drink. If you drink alcohol: Limit how much you have to: 0-1 drink a day for women. 0-2 drinks a day for men. Know how much alcohol is in your drink. In the [place] one drink equals one 12 oz bottle of beer (355 mL), one 5 oz glass of wine (148 mL), or one 1 oz glass of hard liquor (44 mL). Do not use any products that contain nicotine or tobacco. These products include cigarettes, chewing tobacco, and vaping devices, such as e-cigarettes. If you need help quitting, ask your health care provider. Activity  Follow a regular exercise program to stay fit. This will help you maintain your balance. Ask your health care provider what types of exercise are appropriate for you. If you need a  cane or walker, use it as recommended by your health care provider. Wear supportive shoes that have nonskid soles. Safety  Remove any tripping hazards, such as rugs, cords, and clutter. Install safety equipment such as grab bars in bathrooms and safety rails on stairs. Keep rooms and walkways well-lit. General instructions Talk with your health care provider about your risks for falling. Tell your health  care provider if: You fall. Be sure to tell your health care provider about all falls, even ones that seem minor. You feel dizzy, tiredness (fatigue), or off-balance. Take over-the-counter and prescription medicines only as told by your health care provider. These include supplements. Eat a healthy diet and maintain a healthy weight. A healthy diet includes low-fat dairy products, low-fat (lean) meats, and fiber from whole grains, beans, and lots of fruits and vegetables. Stay current with your vaccines. Schedule regular health, dental, and eye exams. Summary Having a healthy lifestyle and getting preventive care can help to protect your health and wellness after age 60. Screening and testing are the best way to find a health problem early and help you avoid having a fall. Early diagnosis and treatment give you the best chance for managing medical conditions that are more common for people who are older than age 64. Falls are a major cause of broken bones and head injuries in people who are older than age 64. Take precautions to prevent a fall at home. Work with your health care provider to learn what changes you can make to improve your health and wellness and to prevent falls. This information is not intended to replace advice given to you by your health care provider. Make sure you discuss any questions you have with your health care provider. Document Revised: 04/22/2021 Document Reviewed: 04/22/2021 Elsevier Patient Education  [place].

## 2022-08-08 NOTE — Assessment & Plan Note (Signed)
Mild, will continue to monitor.

## 2022-08-08 NOTE — Assessment & Plan Note (Signed)
Stable period on low dose levothyroxine 47mcg daily.

## 2022-08-08 NOTE — Assessment & Plan Note (Addendum)
Advanced directives - scanned 07/2016. No life prolonging measures if terminal irreversible condition. Separate HCPOA form filled out - Bruce husband then [employer] are [employer]. Scanned 08/2017.  They will check to see if this needs to be updated.

## 2022-08-08 NOTE — Assessment & Plan Note (Signed)
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

## 2022-08-08 NOTE — Assessment & Plan Note (Addendum)
Mild, saw urology-no need for medication management.
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
## 2022-08-08 NOTE — Assessment & Plan Note (Signed)
Blood counts now normal.

## 2022-08-11 ENCOUNTER — Ambulatory Visit: Payer: Medicare Other | Admitting: Urology

## 2022-08-13 ENCOUNTER — Other Ambulatory Visit: Payer: Self-pay | Admitting: Family Medicine

## 2022-08-13 NOTE — Telephone Encounter (Signed)
Rx from cards sent on 07/03/22, #90/3. Per 08/08/22 OV notes, pt is to continue metoprolol XL 25 mg 1 tab daily.

## 2022-08-20 ENCOUNTER — Encounter: Payer: Self-pay | Admitting: Cardiology

## 2022-08-20 ENCOUNTER — Ambulatory Visit: Payer: Medicare Other | Attending: Cardiology | Admitting: Cardiology

## 2022-08-20 ENCOUNTER — Ambulatory Visit (INDEPENDENT_AMBULATORY_CARE_PROVIDER_SITE_OTHER): Payer: Medicare Other

## 2022-08-20 VITALS — BP 134/68 | HR 66 | Ht 62.5 in | Wt 160.2 lb

## 2022-08-20 DIAGNOSIS — I480 Paroxysmal atrial fibrillation: Secondary | ICD-10-CM | POA: Insufficient documentation

## 2022-08-20 NOTE — Progress Notes (Signed)
Electrophysiology Office Follow up Visit Note:    Date:  08/20/2022   ID:  Alexandria Moreno, DOB September 05, 1941, MRN [id number]  PCP:  Ria Bush, MD  [facility] Cardiologist:  Kate Sable, MD  [facility] Electrophysiologist:  Vickie Epley, MD    Interval History:    Alexandria Moreno is a 81 y.o. female who presents for a follow up visit.  I last saw the patient January 29, 2022 for paroxysmal atrial fibrillation.  She underwent an ablation on October 28, 2021.  She was previously taking Eliquis and Multaq.  Multaq was stopped at the February 2023 appointment. She has not had a recurrence of arrhythmia since her ablation.  She tells me that recently she she has noted an increase in frequency of a very abnormal sensation.  During these episodes, she will feel heaviness all over and an urge to urinate or defecate.  She does not pass out but she thinks that she is close to passing out.  They have become more frequent and today have occurred more than 10 times.  She is very clear that she is never lost consciousness.  No clear triggers.  They have been going on for months.  She is under a lot of stress at home caring for her husband who has dementia and Parkinson's.     Past Medical History:  Diagnosis Date   Basal cell carcinoma 05/22/2015   R nasal ala   Basal cell carcinoma 07/21/2017   L upper arm, L neck, L medial shoulder   Basal cell carcinoma 09/21/2018   R shoulder   Basal cell carcinoma 05/01/2020   L lower neck    Basal cell carcinoma 05/01/2020   L neck ant to scar    Basal cell carcinoma 11/05/2020   L nasal ala, refer for Radiation   Diastolic dysfunction    a. 09/2019 Echo: EF 60-65%, nl RV size/fxn. Nl Atrial sizes. Triv TR. Mild AI; b. 11/2020 Echo: EF 60-65%, no rwma, Gr2DD, nl RV size/fxn, RVSP 34.4mmHg. Mild MR/AI.   Diverticulosis    DJD (degenerative joint disease) of knee    GERD (gastroesophageal reflux disease)    H/o Iron deficiency  anemia 2009   a. 05/2008 EGD/Colonoscopy: nl w/o evidence of bleeding; b. 11/2017 EGD: nl. Colonoscopy: 14mm cecal polyp, otw nl.   H/O transfusion of whole blood 2009   H/O: rheumatic fever    81 years old   History of chicken pox    History of stress test    a. 10/2019 EF 53%, no ischemia/infarct. No significant cor Ca2+. Mild thoracic desc Ao atherosclerosis.   Hx of basal cell carcinoma 2010   L dorsum tip of nose (MOHS), R ant alar groove (MOHS)   Hx of squamous cell carcinoma 11/01/2018   SCC is L upper forehead   Hyperlipidemia    Metatarsal fracture 09/2020   L 5th shaft s/p conservative management [facility])   Migraines    PAF (paroxysmal atrial fibrillation) ([place])    a. 08/2019 Zio: Avg HR 60 (min 43, max 144). PAF (2% burden), longest 1h 29m (avg rate 77). Rare PAC's and PVC's.   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   L upper forehead   Stress incontinence     Past Surgical History:  Procedure Laterality Date   ABDOMINAL HYSTERECTOMY  1981  APPENDECTOMY  1960   ATRIAL FIBRILLATION ABLATION N/A 10/28/2021   Procedure: ATRIAL FIBRILLATION ABLATION;  Surgeon: Vickie Epley, MD;  Location: [place] [facility];  Service: Cardiovascular;  Laterality: N/A;   capsule endoscopy  2009   WNL   CATARACT EXTRACTION Bilateral [id number]   COLONOSCOPY  2009   diverticulosis ([place])   COLONOSCOPY WITH PROPOFOL N/A 12/01/2017   TA, rpt 5 yrs ([facility])   DEXA  06/2016   T -0.2 hip, 0.7 spine   ESOPHAGOGASTRODUODENOSCOPY  2009   focal mild chronic gastritis, neg H pylori ([place])   ESOPHAGOGASTRODUODENOSCOPY (EGD) WITH PROPOFOL N/A 12/01/2017   Procedure: ESOPHAGOGASTRODUODENOSCOPY (EGD) WITH PROPOFOL;  Surgeon: Lucilla Lame, MD;  Location: [facility];  Service: Endoscopy;  Laterality: N/A;   INCONTINENCE SURGERY  2001   with mesh   LIPOMA EXCISION  1971   right  shoulder   MOHS SURGERY     [facility]   OVARIAN CYST REMOVAL  1960   SALPINGOOPHORECTOMY Left 2001   TONSILLECTOMY  1958    Current Medications: Current Meds  Medication Sig   apixaban (ELIQUIS) 5 MG TABS tablet TAKE 1 TABLET TWICE A DAY   atorvastatin (LIPITOR) 40 MG tablet TAKE 1 TABLET DAILY   Calcium Carbonate-Vitamin D (CALTRATE 600+D PO) Take 1 tablet by mouth daily.   chlorhexidine (PERIDEX) 0.12 % solution Use as directed 5 mLs in the mouth or throat daily as needed (dental procedure).   cholecalciferol (VITAMIN D3) 25 MCG (1000 UNIT) tablet Take 1,000 Units by mouth daily.   clobetasol ointment (TEMOVATE) 4.62 % Apply 1 Application topically 2 (two) times daily.   cycloSPORINE (RESTASIS) 0.05 % ophthalmic emulsion Apply 1 drop to eye 2 (two) times daily.   diphenoxylate-atropine (LOMOTIL) 2.5-0.025 MG tablet Take 1 tablet by mouth 4 (four) times daily as needed for diarrhea or loose stools.   GLUCOSAMINE-CHONDROITIN PO Take 1 tablet by mouth 2 (two) times daily.   levothyroxine (SYNTHROID) 25 MCG tablet Take 1 tablet (25 mcg total) by mouth daily.   Methylcellulose, Laxative, (FIBER THERAPY PO) Take 2 capsules by mouth daily.   metoprolol succinate (TOPROL-XL) 25 MG 24 hr tablet Take 1 tablet (25 mg total) by mouth daily.   mometasone (ELOCON) 0.1 % cream Apply to affected areas lower legs 1-2 times a day until improved.   MULTIPLE VITAMIN PO Take 1 tablet by mouth daily.   pantoprazole (PROTONIX) 40 MG tablet Take 1 tablet (40 mg total) by mouth daily.   rizatriptan (MAXALT) 10 MG tablet Take 10 mg by mouth as needed for migraine.   topiramate (TOPAMAX) 50 MG tablet Take 50 mg by mouth 2 (two) times daily.   vitamin B-12 (CYANOCOBALAMIN) 1000 MCG tablet Take 1,000 mcg by mouth daily.     Allergies:   Aspirin, Baclofen, Codeine, Imitrex  [sumatriptan], Influenza vaccines, and Vicodin  [hydrocodone-acetaminophen]   Social History   Socioeconomic History   Marital status:  Married    Spouse name: Not on file   Number of children: Not on file   Years of education: Not on file   Highest education level: Not on file  Occupational History   Not on file  Tobacco Use   Smoking status: Former    Packs/day: 0.50    Years: 20.00    Total pack years: 10.00    Types: Cigarettes    Quit date: 12/15/1978    Years since quitting: 43.7    Passive exposure: Past   Smokeless tobacco: Never  Vaping  Use   Vaping Use: Never used  Substance and Sexual Activity   Alcohol use: No    Alcohol/week: 0.0 standard drinks of alcohol   Drug use: No   Sexual activity: Never  Other Topics Concern   Not on file  Social History Narrative   Lives with husband    Occupation: retired, was Art therapist   Edu: some college   Activity: stationary bicycle   Diet: good water, following slim fast diet, good vegetables   Social Determinants of Health   Financial Resource Strain: Low Risk  (08/08/2022)   Overall Financial Resource Strain (CARDIA)    Difficulty of Paying Living Expenses: Not hard at all  Food Insecurity: No Food Insecurity (08/08/2022)   Hunger Vital Sign    Worried About Running Out of Food in the Last Year: Never true    Ran Out of Food in the Last Year: Never true  Transportation Needs: No Transportation Needs (08/08/2022)   PRAPARE - Hydrologist (Medical): No    Lack of Transportation (Non-Medical): No  Physical Activity: Insufficiently Active (08/08/2022)   Exercise Vital Sign    Days of Exercise per Week: 3 days    Minutes of Exercise per Session: 30 min  Stress: No Stress Concern Present (08/08/2022)   [place]    Feeling of Stress : Only a little  Social Connections: Moderately Isolated (08/08/2022)   Social Connection and Isolation Panel [NHANES]    Frequency of Communication with Friends and Family: Three times a week    Frequency of Social Gatherings with  Friends and Family: Never    Attends Religious Services: Never    Marine scientist or Organizations: No    Attends Music therapist: Never    Marital Status: Married     Family History: The patient's family history includes Alcohol abuse in her father; Alzheimer's disease in her maternal grandfather; Cancer in her mother; Dementia in her mother; Depression in her mother; Heart attack in her brother, brother, and father; Heart disease in her father; Hypertension in her brother; Osteoporosis in her father; Stroke in her brother and paternal grandfather; Stroke (age of onset: 84) in her mother; Stroke (age of onset: 88) in her brother. There is no history of Breast cancer.  ROS:   Please see the history of present illness.    All other systems reviewed and are negative.  EKGs/Labs/Other Studies Reviewed:    The following studies were reviewed today:   EKG:  The ekg ordered today demonstrates sinus rhythm.  Low voltage.  Left anterior fascicular block.  Recent Labs: 08/01/2022: ALT 17; BUN 14; Creatinine, Ser 0.89; Hemoglobin 14.4; Platelets 142.0; Potassium 4.1; Sodium 143; TSH 4.69  Recent Lipid Panel    Component Value Date/Time   CHOL 147 08/01/2022 0833   CHOL 144 11/19/2015 1119   TRIG 180.0 (H) 08/01/2022 0833   HDL 40.70 08/01/2022 0833   HDL 42 11/19/2015 1119   CHOLHDL 4 08/01/2022 0833   VLDL 36.0 08/01/2022 0833   LDLCALC 71 08/01/2022 0833   LDLCALC 71 11/19/2015 1119   LDLDIRECT 87.0 07/30/2021 0921    Physical Exam:    VS:  BP 134/68   Pulse 66   Ht 5' 2.5" (1.588 m)   Wt 160 lb 3.2 oz (72.7 kg)   SpO2 98%   BMI 28.83 kg/m     Wt Readings from Last 3 Encounters:  08/20/22  160 lb 3.2 oz (72.7 kg)  08/08/22 160 lb 2 oz (72.6 kg)  08/08/22 158 lb (71.7 kg)     GEN:  Well nourished, well developed in no acute distress HEENT: Normal NECK: No JVD; No carotid bruits LYMPHATICS: No lymphadenopathy CARDIAC: RRR, no murmurs, rubs,  gallops RESPIRATORY:  Clear to auscultation without rales, wheezing or rhonchi  ABDOMEN: Soft, non-tender, non-distended MUSCULOSKELETAL:  No edema; No deformity  SKIN: Warm and dry NEUROLOGIC:  Alert and oriented x 3 PSYCHIATRIC:  Normal affect        ASSESSMENT:    1. Paroxysmal atrial fibrillation (HCC)    PLAN:    In order of problems listed above:   #Paroxysmal atrial fibrillation On Eliquis for stroke prophylaxis.  Post ablation October 28, 2021. Maintaining rhythm after her ablation procedure.  Continue Eliquis.  #Generalized heaviness/lightheadedness She describes frequent episodes of a concerning constellation of symptoms including generalized heaviness, presyncope.  It is unclear to me what these episodes represent.  I would like to exclude changes to LV function and arrhythmias.  Differential would include paroxysmal AV block.  Would also include dysautonomia. We will start with an echo and a 2-week ZIO monitor and plan to see her back to review the results.  Her primary care physician is also aware of these episodes.  I had vies her that if she experiences a syncopal episode she should immediately present to the hospital for further evaluation.  Follow-up 6 to 8 weeks with me.     Total time spent with patient today 30 minutes. This includes reviewing records, evaluating the patient and coordinating care.   Medication Adjustments/Labs and Tests Ordered: Current medicines are reviewed at length with the patient today.  Concerns regarding medicines are outlined above.  Orders Placed This Encounter  Procedures   LONG TERM MONITOR (3-14 DAYS)   EKG 12-Lead   ECHOCARDIOGRAM COMPLETE   No orders of the defined types were placed in this encounter.    Signed, Lars Mage, MD, [facility], [facility] 08/20/2022 4:09 PM    Electrophysiology [facility]

## 2022-08-20 NOTE — Patient Instructions (Addendum)
Medication Instructions:  none *If you need a refill on your cardiac medications before your next appointment, please call your pharmacy*   Lab Work: none If you have labs (blood work) drawn today and your tests are completely normal, you will receive your results only by: [place] (if you have MyChart) OR A paper copy in the mail If you have any lab test that is abnormal or we need to change your treatment, we will call you to review the results.   Testing/Procedures: Your physician has requested that you have an echocardiogram. Echocardiography is a painless test that uses sound waves to create images of your heart. It provides your doctor with information about the size and shape of your heart and how well your heart's chambers and valves are working. This procedure takes approximately one hour. There are no restrictions for this procedure.  Your physician has recommended that you wear a heart monitor. Heart monitors are medical devices that record the heart's electrical activity. Doctors most often use these monitors to diagnose arrhythmias. Arrhythmias are problems with the speed or rhythm of the heartbeat. The monitor is a small, portable device. You can wear one while you do your normal daily activities. This is usually used to diagnose what is causing palpitations/syncope (passing out).   Follow-Up: At [facility], you and your health needs are our priority.  As part of our continuing mission to provide you with exceptional heart care, we have created designated Provider Care Teams.  These Care Teams include your primary Cardiologist (physician) and Advanced Practice Providers (APPs -  Physician Assistants and Nurse Practitioners) who all work together to provide you with the care you need, when you need it.  We recommend signing up for the patient portal called "MyChart".  Sign up information is provided on this After Visit Summary.  MyChart is used to connect with  patients for Virtual Visits (Telemedicine).  Patients are able to view lab/test results, encounter notes, upcoming appointments, etc.  Non-urgent messages can be sent to your provider as well.   To learn more about what you can do with MyChart, go to [email or web address].    Your next appointment:   6-8  week(s)  The format for your next appointment:   In Person  Provider:   Dr. Quentin Ore  Other Instructions [place] Monitor Instructions  Your physician has requested you wear a ZIO patch monitor for 14 days.  This is a single patch monitor. [employer] supplies one patch monitor per enrollment. Additional stickers are not available. Please do not apply patch if you will be having a Nuclear Stress Test,  Echocardiogram, Cardiac CT, MRI, or Chest Xray during the period you would be wearing the  monitor. The patch cannot be worn during these tests. You cannot remove and re-apply the  ZIO XT patch monitor.  Your ZIO patch monitor will be mailed 3 day USPS to your address on file. It may take 3-5 days  to receive your monitor after you have been enrolled.  Once you have received your monitor, please review the enclosed instructions. Your monitor  has already been registered assigning a specific monitor serial # to you.  Billing and Patient Assistance Program Information  We have supplied [employer] with any of your insurance information on file for billing purposes. [employer] offers a sliding scale Patient Assistance Program for patients that do not have  insurance, or whose insurance does not completely cover the cost of the ZIO monitor.  You  must apply for the Patient Assistance Program to qualify for this discounted rate.  To apply, please call [employer] at [phone], select option 4, select option 2, ask to apply for  Patient Assistance Program. Theodore Demark will ask your household income, and how many people  are in your household. They will quote your out-of-pocket cost based on that  information.  [employer] will also be able to set up a 89-month, interest-free payment plan if needed.  Applying the monitor   Shave hair from upper left chest.  Hold abrader disc by orange tab. Rub abrader in 40 strokes over the upper left chest as  indicated in your monitor instructions.  Clean area with 4 enclosed alcohol pads. Let dry.  Apply patch as indicated in monitor instructions. Patch will be placed under collarbone on left  side of chest with arrow pointing upward.  Rub patch adhesive wings for 2 minutes. Remove white label marked "1". Remove the white  label marked "2". Rub patch adhesive wings for 2 additional minutes.  While looking in a mirror, press and release button in center of patch. A small green light will  flash 3-4 times. This will be your only indicator that the monitor has been turned on.  Do not shower for the first 24 hours. You may shower after the first 24 hours.  Press the button if you feel a symptom. You will hear a small click. Record Date, Time and  Symptom in the Patient Logbook.  When you are ready to remove the patch, follow instructions on the last 2 pages of Patient  Logbook. Stick patch monitor onto the last page of Patient Logbook.  Place Patient Logbook in the blue and white box. Use locking tab on box and tape box closed  securely. The blue and white box has prepaid postage on it. Please place it in the mailbox as  soon as possible. Your physician should have your test results approximately 7 days after the  monitor has been mailed back to [facility].  Call [place] at [phone] if you have questions regarding  your ZIO XT patch monitor. Call them immediately if you see an orange light blinking on your  monitor.  If your monitor falls off in less than 4 days, contact our Monitor department at [phone].  If your monitor becomes loose or falls off after 4 days call [employer] at [phone] for  suggestions on  securing your monitor   Important Information About [place]

## 2022-08-22 DIAGNOSIS — I480 Paroxysmal atrial fibrillation: Secondary | ICD-10-CM

## 2022-08-25 ENCOUNTER — Ambulatory Visit
Admission: RE | Admit: 2022-08-25 | Discharge: 2022-08-25 | Disposition: A | Discharge status: Home / Self Care | Payer: Medicare Other | Source: Ambulatory Visit | Attending: Radiation Oncology | Admitting: Radiation Oncology

## 2022-08-25 DIAGNOSIS — C3490 Malignant neoplasm of unspecified part of unspecified bronchus or lung: Secondary | ICD-10-CM | POA: Diagnosis not present

## 2022-08-25 DIAGNOSIS — R911 Solitary pulmonary nodule: Secondary | ICD-10-CM | POA: Insufficient documentation

## 2022-08-25 MED ORDER — IOHEXOL 300 MG/ML  SOLN
75.0000 mL | Freq: Once | INTRAMUSCULAR | Status: AC | PRN
  Administered 2022-08-25: 75 mL via INTRAVENOUS

## 2022-08-26 DIAGNOSIS — Z961 Presence of intraocular lens: Secondary | ICD-10-CM | POA: Diagnosis not present

## 2022-08-26 DIAGNOSIS — H524 Presbyopia: Secondary | ICD-10-CM | POA: Diagnosis not present

## 2022-08-26 DIAGNOSIS — D3131 Benign neoplasm of right choroid: Secondary | ICD-10-CM | POA: Diagnosis not present

## 2022-08-26 DIAGNOSIS — H43393 Other vitreous opacities, bilateral: Secondary | ICD-10-CM | POA: Diagnosis not present

## 2022-09-01 ENCOUNTER — Ambulatory Visit
Admission: RE | Admit: 2022-09-01 | Discharge: 2022-09-01 | Disposition: A | Discharge status: Home / Self Care | Payer: Medicare Other | Source: Ambulatory Visit | Attending: Radiation Oncology | Admitting: Radiation Oncology

## 2022-09-01 VITALS — BP 133/63 | HR 63 | Temp 97.6°F | Resp 17 | Wt 161.6 lb

## 2022-09-01 DIAGNOSIS — Z85828 Personal history of other malignant neoplasm of skin: Secondary | ICD-10-CM | POA: Insufficient documentation

## 2022-09-01 DIAGNOSIS — R911 Solitary pulmonary nodule: Secondary | ICD-10-CM

## 2022-09-01 DIAGNOSIS — Z923 Personal history of irradiation: Secondary | ICD-10-CM | POA: Diagnosis not present

## 2022-09-01 DIAGNOSIS — C44311 Basal cell carcinoma of skin of nose: Secondary | ICD-10-CM | POA: Diagnosis not present

## 2022-09-01 NOTE — Progress Notes (Signed)
Radiation Oncology Follow up Note  Name: Alexandria Moreno   Date:   09/01/2022 MRN:  [id number] DOB: 22-May-1941    This 81 y.o. female presents to the clinic today for 2-year follow-up status post electron-beam therapy to her nose for superficial nodular basal cell carcinoma.  REFERRING PROVIDER: Ria Bush, MD  HPI: Patient is an 81 year old female now at 2 years having completed electron-beam therapy to her nose for a superficial nodular basal cell carcinoma.  She states occasionally has a small red pimple on her nose that flakes and falls off.  We are also following her for.  A spiculated nodule measuring 2.9 cm in greatest dimension in the anterior inferior right upper lobe which is unchanged since November 22.  It is still somewhat concerning for malignancy.  She is asymptomatic specifically denies cough hemoptysis or chest tightness  COMPLICATIONS OF TREATMENT: none  FOLLOW UP COMPLIANCE: keeps appointments   PHYSICAL EXAM:  BP 133/63 (Patient Position: Sitting)   Pulse 63   Temp 97.6 F (36.4 C)   Resp 17   Wt 161 lb 9.6 oz (73.3 kg)   SpO2 98%   BMI 29.09 kg/m  Nose shows no evidence of malignancy.  No evidence of submental or cervical adenopathy.  Well-developed well-nourished patient in NAD. HEENT reveals PERLA, EOMI, discs not visualized.  Oral cavity is clear. No oral mucosal lesions are identified. Neck is clear without evidence of cervical or supraclavicular adenopathy. Lungs are clear to A&P. Cardiac examination is essentially unremarkable with regular rate and rhythm without murmur rub or thrill. Abdomen is benign with no organomegaly or masses noted. Motor sensory and DTR levels are equal and symmetric in the upper and lower extremities. Cranial nerves II through XII are grossly intact. Proprioception is intact. No peripheral adenopathy or edema is identified. No motor or sensory levels are noted. Crude visual fields are within normal range.  RADIOLOGY RESULTS: CT  scans reviewed compatible with above-stated findings  PLAN: I have asked to see around 6 months for follow-up.  May consider a PET scan at that time should there be any progression in that area.  Otherwise she is doing well from a skin cancer standpoint.  Patient is to call with any concerns.  I would like to take this opportunity to thank you for allowing me to participate in the care of your patient.Noreene Filbert, MD

## 2022-09-08 ENCOUNTER — Other Ambulatory Visit: Payer: Self-pay | Admitting: Cardiology

## 2022-09-08 DIAGNOSIS — I480 Paroxysmal atrial fibrillation: Secondary | ICD-10-CM

## 2022-09-08 NOTE — Telephone Encounter (Signed)
Eliquis 5mg  refill request received. Patient is 81 years old, weight-73.3kg, Crea-0.89 on 08/01/22, Diagnosis-Afib, and last seen by Dr. Quentin Ore on 08/20/2022. Dose is appropriate based on dosing criteria. Will send in refill to requested pharmacy.

## 2022-09-09 DIAGNOSIS — I480 Paroxysmal atrial fibrillation: Secondary | ICD-10-CM | POA: Diagnosis not present

## 2022-09-30 ENCOUNTER — Ambulatory Visit: Payer: Medicare Other | Attending: Cardiology

## 2022-09-30 DIAGNOSIS — I480 Paroxysmal atrial fibrillation: Secondary | ICD-10-CM | POA: Insufficient documentation

## 2022-09-30 LAB — ECHOCARDIOGRAM COMPLETE
AR max vel: 3.42 cm2
AV Area VTI: 3.41 cm2
AV Area mean vel: 3.3 cm2
AV Mean grad: 1 mmHg
AV Peak grad: 2.6 mmHg
AV Vena cont: 0.3 cm
Ao pk vel: 0.8 m/s
Area-P 1/2: 2.86 cm2
Calc EF: 55.2 %
P 1/2 time: 530 msec
S' Lateral: 2.8 cm
Single Plane A2C EF: 57.6 %
Single Plane A4C EF: 53.9 %

## 2022-10-13 ENCOUNTER — Other Ambulatory Visit: Payer: Self-pay | Admitting: Family Medicine

## 2022-10-15 ENCOUNTER — Other Ambulatory Visit: Payer: Self-pay | Admitting: Gastroenterology

## 2022-10-15 ENCOUNTER — Encounter: Payer: Self-pay | Admitting: Cardiology

## 2022-10-15 ENCOUNTER — Ambulatory Visit: Payer: Medicare Other | Attending: Cardiology | Admitting: Cardiology

## 2022-10-15 VITALS — BP 142/68 | HR 64 | Ht 64.0 in | Wt 161.0 lb

## 2022-10-15 DIAGNOSIS — I480 Paroxysmal atrial fibrillation: Secondary | ICD-10-CM | POA: Diagnosis not present

## 2022-10-15 NOTE — Progress Notes (Signed)
Electrophysiology Office Follow up Visit Note:    Date:  10/15/2022   ID:  Alexandria Moreno, DOB 06/19/1941, MRN [id number]  PCP:  Ria Bush, MD  [facility] Cardiologist:  Kate Sable, MD  [facility] Electrophysiologist:  Vickie Epley, MD    Interval History:    Alexandria Moreno is a 81 y.o. female who presents for a follow up visit. They were last seen in clinic August 20, 2022.  She had an ablation October 28, 2021 for her atrial fibrillation and has maintained sinus rhythm after the procedure.  At the last appointment in September she described episodes of generalized heaviness and presyncope.  Given the symptoms, I ordered an echo and a 2-week ZIO monitor.  She presents today for follow-up and to review the results of the studies.  She has not had any further episodes recently.  She is feeling better.    Past Medical History:  Diagnosis Date   Basal cell carcinoma 05/22/2015   R nasal ala   Basal cell carcinoma 07/21/2017   L upper arm, L neck, L medial shoulder   Basal cell carcinoma 09/21/2018   R shoulder   Basal cell carcinoma 05/01/2020   L lower neck    Basal cell carcinoma 05/01/2020   L neck ant to scar    Basal cell carcinoma 11/05/2020   L nasal ala, refer for Radiation   Diastolic dysfunction    a. 09/2019 Echo: EF 60-65%, nl RV size/fxn. Nl Atrial sizes. Triv TR. Mild AI; b. 11/2020 Echo: EF 60-65%, no rwma, Gr2DD, nl RV size/fxn, RVSP 34.10mmHg. Mild MR/AI.   Diverticulosis    DJD (degenerative joint disease) of knee    GERD (gastroesophageal reflux disease)    H/o Iron deficiency anemia 2009   a. 05/2008 EGD/Colonoscopy: nl w/o evidence of bleeding; b. 11/2017 EGD: nl. Colonoscopy: 80mm cecal polyp, otw nl.   H/O transfusion of whole blood 2009   H/O: rheumatic fever    81 years old   History of chicken pox    History of stress test    a. 10/2019 EF 53%, no ischemia/infarct. No significant cor Ca2+. Mild thoracic desc Ao  atherosclerosis.   Hx of basal cell carcinoma 2010   L dorsum tip of nose (MOHS), R ant alar groove (MOHS)   Hx of squamous cell carcinoma 11/01/2018   SCC is L upper forehead   Hyperlipidemia    Metatarsal fracture 09/2020   L 5th shaft s/p conservative management [place])   Migraines    PAF (paroxysmal atrial fibrillation) ([place])    a. 08/2019 Zio: Avg HR 60 (min 43, max 144). PAF (2% burden), longest 1h 52m (avg rate 77). Rare PAC's and PVC's.   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   L upper forehead   Stress incontinence     Past Surgical History:  Procedure Laterality Date   [place] N/A 10/28/2021   Procedure: ATRIAL FIBRILLATION ABLATION;  Surgeon: Vickie Epley, MD;  Location: [place] [facility];  Service: Cardiovascular;  Laterality: N/A;   capsule endoscopy  2009   WNL   CATARACT EXTRACTION Bilateral [id number]   COLONOSCOPY  2009   diverticulosis ([place])   COLONOSCOPY WITH PROPOFOL N/A 12/01/2017   TA, rpt 5 yrs ([place])  DEXA  06/2016   T -0.2 hip, 0.7 spine   ESOPHAGOGASTRODUODENOSCOPY  2009   focal mild chronic gastritis, neg H pylori ([place])   ESOPHAGOGASTRODUODENOSCOPY (EGD) WITH PROPOFOL N/A 12/01/2017   Procedure: ESOPHAGOGASTRODUODENOSCOPY (EGD) WITH PROPOFOL;  Surgeon: Lucilla Lame, MD;  Location: [facility] ENDOSCOPY;  Service: Endoscopy;  Laterality: N/A;   INCONTINENCE SURGERY  2001   with mesh   LIPOMA EXCISION  1971   right shoulder   MOHS SURGERY     [facility]   OVARIAN CYST REMOVAL  1960   SALPINGOOPHORECTOMY Left 2001   TONSILLECTOMY  1958    Current Medications: Current Meds  Medication Sig   apixaban (ELIQUIS) 5 MG TABS tablet TAKE 1 TABLET TWICE A DAY   atorvastatin (LIPITOR) 40 MG tablet TAKE 1 TABLET DAILY   Calcium Carbonate-Vitamin D (CALTRATE 600+D PO) Take 1  tablet by mouth daily.   chlorhexidine (PERIDEX) 0.12 % solution Use as directed 5 mLs in the mouth or throat daily as needed (dental procedure).   cholecalciferol (VITAMIN D3) 25 MCG (1000 UNIT) tablet Take 1,000 Units by mouth daily.   clobetasol ointment (TEMOVATE) 9.48 % Apply 1 Application topically 2 (two) times daily.   cycloSPORINE (RESTASIS) 0.05 % ophthalmic emulsion Apply 1 drop to eye 2 (two) times daily.   diphenoxylate-atropine (LOMOTIL) 2.5-0.025 MG tablet Take 1 tablet by mouth 4 (four) times daily as needed for diarrhea or loose stools.   GLUCOSAMINE-CHONDROITIN PO Take 1 tablet by mouth 2 (two) times daily.   levothyroxine (SYNTHROID) 25 MCG tablet TAKE 1 TABLET DAILY   Methylcellulose, Laxative, (FIBER THERAPY PO) Take 2 capsules by mouth daily.   metoprolol succinate (TOPROL-XL) 25 MG 24 hr tablet Take 1 tablet (25 mg total) by mouth daily.   mometasone (ELOCON) 0.1 % cream Apply to affected areas lower legs 1-2 times a day until improved.   MULTIPLE VITAMIN PO Take 1 tablet by mouth daily.   pantoprazole (PROTONIX) 40 MG tablet Take 1 tablet (40 mg total) by mouth daily.   rizatriptan (MAXALT) 10 MG tablet Take 10 mg by mouth as needed for migraine.   topiramate (TOPAMAX) 50 MG tablet Take 50 mg by mouth 2 (two) times daily.   vitamin B-12 (CYANOCOBALAMIN) 1000 MCG tablet Take 1,000 mcg by mouth daily.     Allergies:   Aspirin, Baclofen, Codeine, Imitrex  [sumatriptan], Influenza vaccines, and Vicodin  [hydrocodone-acetaminophen]   Social History   Socioeconomic History   Marital status: Married    Spouse name: Not on file   Number of children: Not on file   Years of education: Not on file   Highest education level: Not on file  Occupational History   Not on file  Tobacco Use   Smoking status: Former    Packs/day: 0.50    Years: 20.00    Total pack years: 10.00    Types: Cigarettes    Quit date: 12/15/1978    Years since quitting: 43.8    Passive exposure:  Past   Smokeless tobacco: Never  Vaping Use   Vaping Use: Never used  Substance and Sexual Activity   Alcohol use: No    Alcohol/week: 0.0 standard drinks of alcohol   Drug use: No   Sexual activity: Never  Other Topics Concern   Not on file  Social History Narrative   Lives with husband    Occupation: retired, was Art therapist   Edu: some college   Activity: stationary bicycle   Diet: good water, following slim  fast diet, good vegetables   Social Determinants of Health   Financial Resource Strain: Low Risk  (08/08/2022)   Overall Financial Resource Strain (CARDIA)    Difficulty of Paying Living Expenses: Not hard at all  Food Insecurity: No Food Insecurity (08/08/2022)   Hunger Vital Sign    Worried About Running Out of Food in the Last Year: Never true    Ran Out of Food in the Last Year: Never true  Transportation Needs: No Transportation Needs (08/08/2022)   PRAPARE - Hydrologist (Medical): No    Lack of Transportation (Non-Medical): No  Physical Activity: Insufficiently Active (08/08/2022)   Exercise Vital Sign    Days of Exercise per Week: 3 days    Minutes of Exercise per Session: 30 min  Stress: No Stress Concern Present (08/08/2022)   [place]    Feeling of Stress : Only a little  Social Connections: Moderately Isolated (08/08/2022)   Social Connection and Isolation Panel [NHANES]    Frequency of Communication with Friends and Family: Three times a week    Frequency of Social Gatherings with Friends and Family: Never    Attends Religious Services: Never    Marine scientist or Organizations: No    Attends Music therapist: Never    Marital Status: Married     Family History: The patient's family history includes Alcohol abuse in her father; Alzheimer's disease in her maternal grandfather; Cancer in her mother; Dementia in her mother; Depression in  her mother; Heart attack in her brother, brother, and father; Heart disease in her father; Hypertension in her brother; Osteoporosis in her father; Stroke in her brother and paternal grandfather; Stroke (age of onset: 31) in her mother; Stroke (age of onset: 11) in her brother. There is no history of Breast cancer.  ROS:   Please see the history of present illness.    All other systems reviewed and are negative.  EKGs/Labs/Other Studies Reviewed:    The following studies were reviewed today:  September 30, 2022 echo EF normal, 60% RV normal Mild MR Mild AI No AAS  September 14, 2022 ZIO monitor personally reviewed HR 47 - 169 bpm, average 63 bpm. 61 SVT, longest 12 beats. Review of rhythm strip suggests salvos of atrial tachycardia. Rare supraventricular and ventricular ectopy. No sustained arrhythmias. No atrial fibrillation.   Recent Labs: 08/01/2022: ALT 17; BUN 14; Creatinine, Ser 0.89; Hemoglobin 14.4; Platelets 142.0; Potassium 4.1; Sodium 143; TSH 4.69  Recent Lipid Panel    Component Value Date/Time   CHOL 147 08/01/2022 0833   CHOL 144 11/19/2015 1119   TRIG 180.0 (H) 08/01/2022 0833   HDL 40.70 08/01/2022 0833   HDL 42 11/19/2015 1119   CHOLHDL 4 08/01/2022 0833   VLDL 36.0 08/01/2022 0833   LDLCALC 71 08/01/2022 0833   LDLCALC 71 11/19/2015 1119   LDLDIRECT 87.0 07/30/2021 0921    Physical Exam:    VS:  BP (!) 142/68   Pulse 64   Ht 5\' 4"  (1.626 m)   Wt 161 lb (73 kg)   BMI 27.64 kg/m     Wt Readings from Last 3 Encounters:  10/15/22 161 lb (73 kg)  09/01/22 161 lb 9.6 oz (73.3 kg)  08/20/22 160 lb 3.2 oz (72.7 kg)     GEN:  Well nourished, well developed in no acute distress HEENT: Normal NECK: No JVD; No carotid bruits LYMPHATICS: No lymphadenopathy  CARDIAC: RRR, no murmurs, rubs, gallops RESPIRATORY:  Clear to auscultation without rales, wheezing or rhonchi  ABDOMEN: Soft, non-tender, non-distended MUSCULOSKELETAL:  No edema; No deformity   SKIN: Warm and dry NEUROLOGIC:  Alert and oriented x 3 PSYCHIATRIC:  Normal affect        ASSESSMENT:    1. Paroxysmal atrial fibrillation (HCC)    PLAN:    In order of problems listed above:  #Paroxysmal atrial fibrillation Doing well after her ablation in November 2022.  No recurrence of arrhythmia. Continue Eliquis for stroke prophylaxis.  Recent echo and ZIO monitor reassuring.  Follow-up 1 year or sooner as needed with APP.   Medication Adjustments/Labs and Tests Ordered: Current medicines are reviewed at length with the patient today.  Concerns regarding medicines are outlined above.  No orders of the defined types were placed in this encounter.  No orders of the defined types were placed in this encounter.    Signed, Lars Mage, MD, [place], [facility] 10/15/2022 1:42 PM    Electrophysiology [facility]

## 2022-10-15 NOTE — Patient Instructions (Signed)
Medication Instructions:  None  *If you need a refill on your cardiac medications before your next appointment, please call your pharmacy*   Lab Work: None  If you have labs (blood work) drawn today and your tests are completely normal, you will receive your results only by: [place] (if you have MyChart) OR A paper copy in the mail If you have any lab test that is abnormal or we need to change your treatment, we will call you to review the results.   Testing/Procedures: None    Follow-Up: At [facility], you and your health needs are our priority.  As part of our continuing mission to provide you with exceptional heart care, we have created designated Provider Care Teams.  These Care Teams include your primary Cardiologist (physician) and Advanced Practice Providers (APPs -  Physician Assistants and Nurse Practitioners) who all work together to provide you with the care you need, when you need it.  We recommend signing up for the patient portal called "MyChart".  Sign up information is provided on this After Visit Summary.  MyChart is used to connect with patients for Virtual Visits (Telemedicine).  Patients are able to view lab/test results, encounter notes, upcoming appointments, etc.  Non-urgent messages can be sent to your provider as well.   To learn more about what you can do with MyChart, go to [email or web address].    Your next appointment:   1 year(s)  The format for your next appointment:   In Person  Provider:   You will see one of the following Advanced Practice Providers on your designated Care Team:   Murray Hodgkins, NP Christell Faith, PA-C Cadence Kathlen Mody, PA-C Gerrie Nordmann, NP      Other Instructions None   Important Information About [place]

## 2022-12-01 ENCOUNTER — Ambulatory Visit: Payer: Medicare Other | Admitting: Dermatology

## 2022-12-12 ENCOUNTER — Encounter: Payer: Self-pay | Admitting: Family Medicine

## 2022-12-31 ENCOUNTER — Telehealth: Payer: Self-pay | Admitting: Gastroenterology

## 2022-12-31 ENCOUNTER — Other Ambulatory Visit: Payer: Self-pay | Admitting: Gastroenterology

## 2022-12-31 MED ORDER — PANTOPRAZOLE SODIUM 40 MG PO TBEC: 40.0000 mg | DELAYED_RELEASE_TABLET | Freq: Every day | ORAL | 1 refills | Status: AC

## 2022-12-31 NOTE — Telephone Encounter (Signed)
Patient called to set up an annual visit appointment and is requesting her refill of pantoprazole 40 mg

## 2022-12-31 NOTE — Addendum Note (Signed)
Addended by: Roena Malady on: 12/31/2022 10:24 AM   Modules accepted: Orders

## 2022-12-31 NOTE — Telephone Encounter (Signed)
Rx sent through e-scribe  

## 2023-01-22 ENCOUNTER — Encounter (HOSPITAL_COMMUNITY): Payer: Self-pay | Admitting: *Deleted

## 2023-01-26 ENCOUNTER — Other Ambulatory Visit: Payer: Self-pay | Admitting: Family Medicine

## 2023-02-09 ENCOUNTER — Ambulatory Visit: Payer: Medicare Other | Admitting: Family Medicine

## 2023-03-04 ENCOUNTER — Ambulatory Visit
Admission: RE | Admit: 2023-03-04 | Discharge: 2023-03-04 | Disposition: A | Discharge status: Home / Self Care | Payer: Medicare Other | Source: Ambulatory Visit | Attending: Radiation Oncology | Admitting: Radiation Oncology

## 2023-03-04 DIAGNOSIS — R911 Solitary pulmonary nodule: Secondary | ICD-10-CM | POA: Diagnosis not present

## 2023-03-04 MED ORDER — IOHEXOL 300 MG/ML  SOLN
75.0000 mL | Freq: Once | INTRAMUSCULAR | Status: AC | PRN
  Administered 2023-03-04: 75 mL via INTRAVENOUS

## 2023-03-05 ENCOUNTER — Encounter: Payer: Self-pay | Admitting: Radiation Oncology

## 2023-03-05 ENCOUNTER — Ambulatory Visit
Admission: RE | Admit: 2023-03-05 | Discharge: 2023-03-05 | Disposition: A | Discharge status: Home / Self Care | Payer: Medicare Other | Source: Ambulatory Visit | Attending: Radiation Oncology | Admitting: Radiation Oncology

## 2023-03-05 VITALS — BP 127/51 | HR 59 | Temp 97.5°F | Resp 16 | Ht 64.0 in | Wt 153.2 lb

## 2023-03-05 DIAGNOSIS — Z85828 Personal history of other malignant neoplasm of skin: Secondary | ICD-10-CM | POA: Diagnosis not present

## 2023-03-05 DIAGNOSIS — Z923 Personal history of irradiation: Secondary | ICD-10-CM | POA: Diagnosis not present

## 2023-03-05 DIAGNOSIS — C44311 Basal cell carcinoma of skin of nose: Secondary | ICD-10-CM | POA: Diagnosis not present

## 2023-03-05 DIAGNOSIS — R911 Solitary pulmonary nodule: Secondary | ICD-10-CM | POA: Diagnosis not present

## 2023-03-05 NOTE — Progress Notes (Signed)
Radiation Oncology Follow up Note  Name: Alexandria Moreno   Date:   03/05/2023 MRN:  [id number] DOB: 29-Apr-1941    This 82 y.o. female presents to the clinic today for following a right upper lobe lung nodule for the past 3 years unchanged.  REFERRING PROVIDER: Ria Bush, MD  HPI: Patient is an 82 year old female we been tracking a right upper lobe lung nodule for 3 years.  She also has previously received electron-beam therapy to his nodular basal cell carcinoma of her nose.  She is completely asymptomatic.  Had a recent CT scan which I have reviewed not formally read but shows no increase in size in the right upper lobe nodule.  Patient is moving to [place] and will transfer care to PMD there.  She continues to be asymptomatic.  COMPLICATIONS OF TREATMENT: none  FOLLOW UP COMPLIANCE: keeps appointments   PHYSICAL EXAM:  BP (!) 127/51   Pulse (!) 59   Temp (!) 97.5 F (36.4 C)   Resp 16   Ht 5\' 4"  (1.626 m)   Wt 153 lb 3.2 oz (69.5 kg)   BMI 26.30 kg/m  Well-developed well-nourished patient in NAD. HEENT reveals PERLA, EOMI, discs not visualized.  Oral cavity is clear. No oral mucosal lesions are identified. Neck is clear without evidence of cervical or supraclavicular adenopathy. Lungs are clear to A&P. Cardiac examination is essentially unremarkable with regular rate and rhythm without murmur rub or thrill. Abdomen is benign with no organomegaly or masses noted. Motor sensory and DTR levels are equal and symmetric in the upper and lower extremities. Cranial nerves II through XII are grossly intact. Proprioception is intact. No peripheral adenopathy or edema is identified. No motor or sensory levels are noted. Crude visual fields are within normal range.  RADIOLOGY RESULTS: CT scan reviewed compatible with above-stated findings  PLAN: Present time there is been no interval growth in the right upper lobe lung nodule over 3 years.  She is moving to [place] her husband  will be going into assisted living.  She will find medical care there and I have asked her just to run a CT scan once a year keep track of that nodule.  Patient knows to call with any concerns or to have [place] transfer any medical records.  I would like to take this opportunity to thank you for allowing me to participate in the care of your patient.Noreene Filbert, MD

## 2023-03-11 ENCOUNTER — Ambulatory Visit: Payer: Medicare Other | Admitting: Radiation Oncology

## 2023-03-24 ENCOUNTER — Ambulatory Visit (INDEPENDENT_AMBULATORY_CARE_PROVIDER_SITE_OTHER): Payer: Medicare Other | Admitting: Family Medicine

## 2023-03-24 ENCOUNTER — Encounter: Payer: Self-pay | Admitting: Family Medicine

## 2023-03-24 VITALS — BP 110/62 | HR 74 | Temp 97.4°F | Ht 64.0 in | Wt 155.0 lb

## 2023-03-24 DIAGNOSIS — I480 Paroxysmal atrial fibrillation: Secondary | ICD-10-CM

## 2023-03-24 DIAGNOSIS — R911 Solitary pulmonary nodule: Secondary | ICD-10-CM | POA: Diagnosis not present

## 2023-03-24 DIAGNOSIS — M542 Cervicalgia: Secondary | ICD-10-CM | POA: Diagnosis not present

## 2023-03-24 MED ORDER — METHOCARBAMOL 500 MG PO TABS: 500.0000 mg | ORAL_TABLET | Freq: Three times a day (TID) | ORAL | 0 refills | Status: AC | PRN

## 2023-03-24 NOTE — Addendum Note (Signed)
Addended by: Eustaquio Boyden on: 03/24/2023 10:02 AM   Modules accepted: Level of Service

## 2023-03-24 NOTE — Assessment & Plan Note (Signed)
Continue eliquis preventatively.

## 2023-03-24 NOTE — Patient Instructions (Addendum)
Good to see you today.  For neck - may use topical voltaren (diclofenac) gel over the counter, use heating pad, gentle stretching. May use robaxin muscle relaxant 2-3 times daily as needed, with sedation precautions.  Best of luck with upcoming move! Say hi to Bruce for me!

## 2023-03-24 NOTE — Assessment & Plan Note (Signed)
Slowly enlarging - she plans to establish with local oncology clinic in [place]

## 2023-03-24 NOTE — Progress Notes (Signed)
Ph: [phone]      Fax: [phone]   Patient ID: Alexandria Moreno, female    DOB: [birth year], 82 y.o.   MRN: [id number]  This visit was conducted in person.  BP 110/62   Pulse 74   Temp (!) 97.4 F (36.3 C) (Temporal)   Ht 5\' 4"  (1.626 m)   Wt 155 lb (70.3 kg)   SpO2 98%   BMI 26.61 kg/m    CC: neck pain  Subjective:   HPI: Alexandria Moreno is a 82 y.o. female presenting on 03/24/2023 for Follow-up (Patient is moving out of state wanted to follow up with you one last time before move) and Neck Pain (Has been moving and packing having some stiffness of neck. )   Stressful period caring for her husband with parkinson's disease. He was recently hospitalized from SNF with AMS - there was some concern over poor treatment/management while at initial SNF facility in [facility] - he is getting OT, PT, ST. Planning to move to memory unit in [place] closer to [place] children. She will also move to [place].   Noticing some neck stiffness with moving/packing activity over the past few weeks, worsening recently. No shooting pain down arms, numbness/tingling or weakness of arms. She's been using ibuprofen and lidocaine with icy hot with benefit. Has done ok with muscle relaxants in the past.   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  Paroxysmal afib s/p ablation 10/2021 without recurrent arrhythmia followed by cardiology on eliquis. Recent echo and zio monitor reassuring.  Migraines - sees neurology , on topamax 50mg  bid and maxalt 10mg  PRN.  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      Relevant past medical, surgical, family and social history reviewed and updated as indicated. Interim medical history since our last visit reviewed. Allergies and medications reviewed and updated. Outpatient Medications Prior to Visit  Medication Sig Dispense  Refill   apixaban (ELIQUIS) 5 MG TABS tablet TAKE 1 TABLET TWICE A DAY 180 tablet 2   atorvastatin (LIPITOR) 40 MG tablet TAKE 1 TABLET DAILY 90 tablet 1   Calcium Carbonate-Vitamin D (CALTRATE 600+D PO) Take 1 tablet by mouth daily.     chlorhexidine (PERIDEX) 0.12 % solution Use as directed 5 mLs in the mouth or throat daily as needed (dental procedure).  0   cholecalciferol (VITAMIN D3) 25 MCG (1000 UNIT) tablet Take 1,000 Units by mouth daily.     clobetasol ointment (TEMOVATE) 0.05 % Apply 1 Application topically 2 (two) times daily. 30 g 0   cycloSPORINE (RESTASIS) 0.05 % ophthalmic emulsion Apply 1 drop to eye 2 (two) times daily.     diphenoxylate-atropine (LOMOTIL) 2.5-0.025 MG tablet Take 1 tablet by mouth 4 (four) times daily as needed for diarrhea or loose stools.     GLUCOSAMINE-CHONDROITIN PO Take 1 tablet by mouth 2 (two) times daily.     levothyroxine (SYNTHROID) 25 MCG tablet TAKE 1 TABLET DAILY 90 tablet 2   Methylcellulose, Laxative, (FIBER THERAPY PO) Take 2 capsules by mouth daily.     metoprolol succinate (TOPROL-XL) 25 MG 24 hr tablet Take 1 tablet (25 mg total) by mouth daily. 90 tablet 3   mometasone (ELOCON) 0.1 % cream Apply to affected areas lower legs 1-2 times a day until improved. 45 g 0   MULTIPLE VITAMIN PO Take 1 tablet  by mouth daily.     pantoprazole (PROTONIX) 40 MG tablet Take 1 tablet (40 mg total) by mouth daily. 90 tablet 1   rizatriptan (MAXALT) 10 MG tablet Take 10 mg by mouth as needed for migraine.     topiramate (TOPAMAX) 50 MG tablet Take 50 mg by mouth 2 (two) times daily.     vitamin B-12 (CYANOCOBALAMIN) 1000 MCG tablet Take 1,000 mcg by mouth daily.     No facility-administered medications prior to visit.     Per HPI unless specifically indicated in ROS section below Review of Systems  Objective:  BP 110/62   Pulse 74   Temp (!) 97.4 F (36.3 C) (Temporal)   Ht  (1.626 m)   Wt 155 lb (70.3 kg)   SpO2 98%   BMI 26.61 kg/m   Wt  Readings from Last 3 Encounters:  03/24/23 155 lb (70.3 kg)  03/05/23 153 lb 3.2 oz (69.5 kg)  10/15/22 161 lb (73 kg)      Physical Exam Vitals and nursing note reviewed.  Constitutional:      Appearance: Normal appearance. She is not ill-appearing.  HENT:     Mouth/Throat:     Mouth: Mucous membranes are moist.     Pharynx: Oropharynx is clear. No oropharyngeal exudate or posterior oropharyngeal erythema.  Eyes:     Extraocular Movements: Extraocular movements intact.     Conjunctiva/sclera: Conjunctivae normal.     Pupils: Pupils are equal, round, and reactive to light.  Neck:     Comments:  Limited ROM due to pain in neck extension, lateral rotation and lateral flexion  Tender and tightness to palpation of trapezius mm as well as paracervical mm  Musculoskeletal:     Cervical back: Neck supple. Tenderness present.     Comments: FROM at bilateral shoulders  Lymphadenopathy:     Cervical: No cervical adenopathy.  Skin:    General: Skin is warm and dry.     Findings: No rash.  Neurological:     Mental Status: She is alert.     Comments:  Grip strength intact Sensation intact  Psychiatric:        Mood and Affect: Mood normal.        Behavior: Behavior normal.           03/24/2023    9:14 AM 08/08/2022   10:22 AM 08/07/2021    2:08 PM 07/27/2020    9:11 AM 07/25/2019    9:28 AM  Depression screen PHQ 2/9  Decreased Interest 0 0 0 0 0  Down, Depressed, Hopeless 0 0 0 0 0  PHQ - 2 Score 0 0 0 0 0  Altered sleeping 0 0  0   Tired, decreased energy 0 0  0   Change in appetite 0 0  0   Feeling bad or failure about yourself  0 0  0   Trouble concentrating 0 0  0   Moving slowly or fidgety/restless 0 0  0   Suicidal thoughts 0 0  0   PHQ-9 Score 0 0  0   Difficult doing work/chores Not difficult at all Not difficult at all  Not difficult at all        03/24/2023    9:15 AM  GAD 7 : Generalized Anxiety Score  Nervous, Anxious, on Edge 0  Control/stop worrying 0   Worry too much - different things 0  Trouble relaxing 0  Restless 0  Easily annoyed or irritable  0  Afraid - awful might happen 0  Total GAD 7 Score 0  Anxiety Difficulty Not difficult at all   Assessment & Plan:   Problem List Items Addressed This Visit     Paroxysmal atrial fibrillation    Continue eliquis preventatively.       Pulmonary nodule    Slowly enlarging - [facility] [place] [facility] [place] [place]      Neck pain, acute - Primary    Story/exam most consistent with neck strain/muscle tightness after increased activity due to recent moving/packing.  Supportive measures reviewed including heat, gentle stretching, topical NSAID and short course of muscle relaxant with sedation precautions.  Update if not improving with treatment. Rec avoid oral NSAIDs due to eliquis use.         Meds ordered this encounter  Medications   methocarbamol (ROBAXIN) 500 MG tablet    Sig: Take 1 tablet (500 mg total) by mouth 3 (three) times daily as needed for muscle spasms.    Dispense:  40 tablet    Refill:  0    No orders of the defined types were placed in this encounter.   Patient Instructions  Good to see you today.  For neck - may use topical voltaren (diclofenac) gel over the counter, use heating pad, gentle stretching. May use robaxin muscle relaxant 2-3 times daily as needed, with sedation precautions.  Best of luck with upcoming move! Say hi to Bruce for me!  Follow up plan: Return if symptoms worsen or fail to improve.  Eustaquio BoydenJavier Esteban Kobashigawa, MD

## 2023-03-24 NOTE — Assessment & Plan Note (Signed)
Story/exam most consistent with neck strain/muscle tightness after increased activity due to recent moving/packing.  Supportive measures reviewed including heat, gentle stretching, topical NSAID and short course of muscle relaxant with sedation precautions.  Update if not improving with treatment. Rec avoid oral NSAIDs due to eliquis use.

## 2023-04-09 ENCOUNTER — Ambulatory Visit: Payer: Medicare Other | Admitting: Gastroenterology

## 2023-05-13 ENCOUNTER — Other Ambulatory Visit: Payer: Self-pay | Admitting: Cardiology

## 2023-05-13 DIAGNOSIS — I480 Paroxysmal atrial fibrillation: Secondary | ICD-10-CM

## 2023-05-13 NOTE — Telephone Encounter (Signed)
Prescription refill request for Eliquis received. Indication:afib Last office visit:11/23 Scr:0.89 Age: 82 Weight:70.3  kg  Prescription refilled

## 2023-05-27 ENCOUNTER — Telehealth: Payer: Self-pay | Admitting: Family Medicine

## 2023-05-27 NOTE — Telephone Encounter (Signed)
Contacted Tomma Lightning to schedule their annual wellness visit. Patient declined to schedule AWV at this time.Moved out of state.   [facility] Care Guide [facility] AWV TEAM Direct Dial: [phone]

## 2023-06-15 ENCOUNTER — Ambulatory Visit: Payer: Medicare Other | Admitting: Dermatology

## 2023-07-09 ENCOUNTER — Other Ambulatory Visit: Payer: Self-pay | Admitting: Family Medicine

## 2023-07-09 DIAGNOSIS — E038 Other specified hypothyroidism: Secondary | ICD-10-CM

## 2023-07-09 NOTE — Telephone Encounter (Signed)
Called patient to schedule, she advised me that herself and her husband have moved out of the area and are looking for new primary care providers close to where they live. States she is very grateful to Dr. Reece Agar for refilling this medication for her and that she will miss him. Patient is not in need of appointment at this time.

## 2023-07-09 NOTE — Telephone Encounter (Signed)
Noted.  Fyi to Dr. G.  

## 2023-07-09 NOTE — Telephone Encounter (Addendum)
Noted. Thanks.

## 2023-07-09 NOTE — Telephone Encounter (Signed)
E-scribed refill  Plz schedule CPE and fasting lab (no food/drink- except water and/or blk coffee 5 hrs prior) visits for additional refills.  

## 2023-07-23 ENCOUNTER — Other Ambulatory Visit: Payer: Self-pay | Admitting: Family Medicine

## 2023-07-24 NOTE — Telephone Encounter (Signed)
Pt has moved.  Request denied.

## 2023-08-04 ENCOUNTER — Other Ambulatory Visit: Payer: Medicare Other

## 2023-08-11 ENCOUNTER — Encounter: Payer: Medicare Other | Admitting: Family Medicine

## 2024-03-02 ENCOUNTER — Other Ambulatory Visit: Payer: Self-pay | Admitting: Family Medicine

## 2024-03-02 DIAGNOSIS — E038 Other specified hypothyroidism: Secondary | ICD-10-CM

## 2024-03-02 NOTE — Telephone Encounter (Signed)
 ERx #90 x1.  She has moved out of state. Future refills will need to come from new PCP.
